# Patient Record
Sex: Male | Born: 1955
Health system: Southern US, Community
[De-identification: ages and names within clinical notes are randomized; demographics above are authoritative.]

## PROBLEM LIST (undated history)

## (undated) DIAGNOSIS — C6290 Malignant neoplasm of unspecified testis, unspecified whether descended or undescended: Secondary | ICD-10-CM

## (undated) DIAGNOSIS — K5790 Diverticulosis of intestine, part unspecified, without perforation or abscess without bleeding: Secondary | ICD-10-CM

## (undated) DIAGNOSIS — K219 Gastro-esophageal reflux disease without esophagitis: Secondary | ICD-10-CM

## (undated) DIAGNOSIS — K648 Other hemorrhoids: Secondary | ICD-10-CM

## (undated) DIAGNOSIS — L0390 Cellulitis, unspecified: Secondary | ICD-10-CM

## (undated) DIAGNOSIS — T7840XA Allergy, unspecified, initial encounter: Secondary | ICD-10-CM

## (undated) DIAGNOSIS — I809 Phlebitis and thrombophlebitis of unspecified site: Secondary | ICD-10-CM

## (undated) DIAGNOSIS — R609 Edema, unspecified: Secondary | ICD-10-CM

## (undated) DIAGNOSIS — K449 Diaphragmatic hernia without obstruction or gangrene: Secondary | ICD-10-CM

## (undated) DIAGNOSIS — L309 Dermatitis, unspecified: Secondary | ICD-10-CM

## (undated) DIAGNOSIS — D126 Benign neoplasm of colon, unspecified: Secondary | ICD-10-CM

## (undated) DIAGNOSIS — L03119 Cellulitis of unspecified part of limb: Secondary | ICD-10-CM

## (undated) HISTORY — DX: Cellulitis of unspecified part of limb: L03.119

## (undated) HISTORY — PX: COLONOSCOPY W/ POLYPECTOMY: SHX1380

## (undated) HISTORY — DX: Diaphragmatic hernia without obstruction or gangrene: K44.9

## (undated) HISTORY — DX: Other hemorrhoids: K64.8

## (undated) HISTORY — DX: Cellulitis, unspecified: L03.90

## (undated) HISTORY — DX: Benign neoplasm of colon, unspecified: D12.6

## (undated) HISTORY — DX: Phlebitis and thrombophlebitis of unspecified site: I80.9

## (undated) HISTORY — PX: OTHER SURGICAL HISTORY: SHX169

## (undated) HISTORY — DX: Gastro-esophageal reflux disease without esophagitis: K21.9

## (undated) HISTORY — PX: KNEE ARTHROPLASTY: SHX992

## (undated) HISTORY — DX: Diverticulosis of intestine, part unspecified, without perforation or abscess without bleeding: K57.90

## (undated) HISTORY — DX: Dermatitis, unspecified: L30.9

## (undated) HISTORY — PX: FRACTURE SURGERY: SHX138

## (undated) HISTORY — DX: Allergy, unspecified, initial encounter: T78.40XA

---

## 1979-03-24 HISTORY — PX: KNEE ARTHROSCOPY: SHX127

## 1999-03-24 DIAGNOSIS — L0390 Cellulitis, unspecified: Secondary | ICD-10-CM

## 1999-03-24 HISTORY — DX: Cellulitis, unspecified: L03.90

## 1999-10-10 ENCOUNTER — Inpatient Hospital Stay (HOSPITAL_COMMUNITY): Admission: EM | Admit: 1999-10-10 | Discharge: 1999-10-18 | Payer: Self-pay | Admitting: Internal Medicine

## 1999-10-10 ENCOUNTER — Encounter: Payer: Self-pay | Admitting: Internal Medicine

## 1999-10-12 ENCOUNTER — Encounter: Payer: Self-pay | Admitting: Internal Medicine

## 1999-10-17 ENCOUNTER — Encounter: Payer: Self-pay | Admitting: Internal Medicine

## 2002-03-23 DIAGNOSIS — C6290 Malignant neoplasm of unspecified testis, unspecified whether descended or undescended: Secondary | ICD-10-CM

## 2002-03-23 HISTORY — PX: I & D EXTREMITY: SHX5045

## 2002-03-23 HISTORY — PX: REFRACTIVE SURGERY: SHX103

## 2002-03-23 HISTORY — DX: Malignant neoplasm of unspecified testis, unspecified whether descended or undescended: C62.90

## 2002-06-30 ENCOUNTER — Encounter: Payer: Self-pay | Admitting: Orthopedic Surgery

## 2002-06-30 ENCOUNTER — Inpatient Hospital Stay (HOSPITAL_COMMUNITY): Admission: EM | Admit: 2002-06-30 | Discharge: 2002-07-03 | Payer: Self-pay | Admitting: Emergency Medicine

## 2003-03-24 HISTORY — PX: TUMOR EXCISION: SHX421

## 2003-03-26 ENCOUNTER — Encounter: Admission: RE | Admit: 2003-03-26 | Discharge: 2003-03-26 | Payer: Self-pay | Admitting: Internal Medicine

## 2003-04-12 ENCOUNTER — Ambulatory Visit (HOSPITAL_BASED_OUTPATIENT_CLINIC_OR_DEPARTMENT_OTHER): Admission: RE | Admit: 2003-04-12 | Discharge: 2003-04-12 | Payer: Self-pay | Admitting: Urology

## 2003-04-12 ENCOUNTER — Encounter (INDEPENDENT_AMBULATORY_CARE_PROVIDER_SITE_OTHER): Payer: Self-pay | Admitting: Specialist

## 2003-04-12 ENCOUNTER — Ambulatory Visit (HOSPITAL_COMMUNITY): Admission: RE | Admit: 2003-04-12 | Discharge: 2003-04-12 | Payer: Self-pay | Admitting: Urology

## 2003-04-26 ENCOUNTER — Ambulatory Visit (HOSPITAL_COMMUNITY): Admission: RE | Admit: 2003-04-26 | Discharge: 2003-04-26 | Payer: Self-pay | Admitting: Urology

## 2003-06-11 ENCOUNTER — Ambulatory Visit (HOSPITAL_COMMUNITY): Admission: RE | Admit: 2003-06-11 | Discharge: 2003-06-11 | Payer: Self-pay | Admitting: Hematology & Oncology

## 2003-08-13 ENCOUNTER — Ambulatory Visit (HOSPITAL_COMMUNITY): Admission: RE | Admit: 2003-08-13 | Discharge: 2003-08-13 | Payer: Self-pay | Admitting: Hematology & Oncology

## 2003-11-20 ENCOUNTER — Ambulatory Visit (HOSPITAL_COMMUNITY): Admission: RE | Admit: 2003-11-20 | Discharge: 2003-11-20 | Payer: Self-pay | Admitting: Hematology & Oncology

## 2004-02-23 ENCOUNTER — Ambulatory Visit: Payer: Self-pay | Admitting: Hematology & Oncology

## 2004-02-25 ENCOUNTER — Ambulatory Visit (HOSPITAL_COMMUNITY): Admission: RE | Admit: 2004-02-25 | Discharge: 2004-02-25 | Payer: Self-pay | Admitting: Hematology & Oncology

## 2004-04-24 ENCOUNTER — Ambulatory Visit: Payer: Self-pay | Admitting: Internal Medicine

## 2004-07-07 ENCOUNTER — Ambulatory Visit: Payer: Self-pay | Admitting: Hematology & Oncology

## 2004-07-07 ENCOUNTER — Ambulatory Visit: Admission: RE | Admit: 2004-07-07 | Discharge: 2004-07-07 | Payer: Self-pay | Admitting: Hematology & Oncology

## 2004-12-01 ENCOUNTER — Ambulatory Visit: Payer: Self-pay | Admitting: Hematology & Oncology

## 2004-12-17 ENCOUNTER — Ambulatory Visit (HOSPITAL_COMMUNITY): Admission: RE | Admit: 2004-12-17 | Discharge: 2004-12-17 | Payer: Self-pay | Admitting: Hematology & Oncology

## 2005-06-15 ENCOUNTER — Ambulatory Visit (HOSPITAL_COMMUNITY): Admission: RE | Admit: 2005-06-15 | Discharge: 2005-06-15 | Payer: Self-pay | Admitting: Hematology & Oncology

## 2005-07-06 ENCOUNTER — Ambulatory Visit: Payer: Self-pay | Admitting: Hematology & Oncology

## 2005-07-06 LAB — COMPREHENSIVE METABOLIC PANEL
ALT: 24 U/L (ref 0–40)
AST: 23 U/L (ref 0–37)
Albumin: 4.3 g/dL (ref 3.5–5.2)
BUN: 12 mg/dL (ref 6–23)
CO2: 27 mEq/L (ref 19–32)
Calcium: 9.1 mg/dL (ref 8.4–10.5)
Chloride: 102 mEq/L (ref 96–112)
Creatinine, Ser: 0.9 mg/dL (ref 0.4–1.5)
Potassium: 3.6 mEq/L (ref 3.5–5.3)

## 2005-07-06 LAB — LIPID PANEL
Cholesterol: 212 mg/dL — ABNORMAL HIGH (ref 0–200)
Triglycerides: 157 mg/dL — ABNORMAL HIGH (ref <150)

## 2005-07-06 LAB — CBC WITH DIFFERENTIAL/PLATELET
BASO%: 0.3 % (ref 0.0–2.0)
EOS%: 6 % (ref 0.0–7.0)
MCH: 29.9 pg (ref 28.0–33.4)
MCHC: 34.7 g/dL (ref 32.0–35.9)
MONO#: 0.4 10*3/uL (ref 0.1–0.9)
RBC: 5.31 10*6/uL (ref 4.20–5.71)
RDW: 13.1 % (ref 11.2–14.6)
WBC: 6.3 10*3/uL (ref 4.0–10.0)
lymph#: 0.8 10*3/uL — ABNORMAL LOW (ref 0.9–3.3)

## 2005-07-09 LAB — AFP TUMOR MARKER: AFP-Tumor Marker: 4.4 ng/mL (ref 0.0–8.0)

## 2005-11-02 ENCOUNTER — Ambulatory Visit: Payer: Self-pay | Admitting: Internal Medicine

## 2005-12-24 ENCOUNTER — Ambulatory Visit: Payer: Self-pay | Admitting: Internal Medicine

## 2006-01-27 ENCOUNTER — Ambulatory Visit: Payer: Self-pay | Admitting: Hematology & Oncology

## 2006-01-29 LAB — CBC WITH DIFFERENTIAL/PLATELET
Basophils Absolute: 0 10*3/uL (ref 0.0–0.1)
EOS%: 9.8 % — ABNORMAL HIGH (ref 0.0–7.0)
Eosinophils Absolute: 0.8 10*3/uL — ABNORMAL HIGH (ref 0.0–0.5)
HCT: 45.1 % (ref 38.7–49.9)
HGB: 15.4 g/dL (ref 13.0–17.1)
MCH: 30 pg (ref 28.0–33.4)
NEUT%: 68.3 % (ref 40.0–75.0)
lymph#: 1.1 10*3/uL (ref 0.9–3.3)

## 2006-02-02 LAB — COMPREHENSIVE METABOLIC PANEL
Albumin: 4.1 g/dL (ref 3.5–5.2)
CO2: 30 mEq/L (ref 19–32)
Calcium: 9.5 mg/dL (ref 8.4–10.5)
Chloride: 102 mEq/L (ref 96–112)
Glucose, Bld: 74 mg/dL (ref 70–99)
Potassium: 4.2 mEq/L (ref 3.5–5.3)
Sodium: 140 mEq/L (ref 135–145)
Total Bilirubin: 1 mg/dL (ref 0.3–1.2)
Total Protein: 7.1 g/dL (ref 6.0–8.3)

## 2006-02-02 LAB — LACTATE DEHYDROGENASE: LDH: 206 U/L (ref 94–250)

## 2006-03-25 ENCOUNTER — Ambulatory Visit: Payer: Self-pay | Admitting: Internal Medicine

## 2006-07-28 ENCOUNTER — Ambulatory Visit: Payer: Self-pay | Admitting: Hematology & Oncology

## 2006-07-30 LAB — CBC WITH DIFFERENTIAL/PLATELET
BASO%: 0.3 % (ref 0.0–2.0)
EOS%: 8.6 % — ABNORMAL HIGH (ref 0.0–7.0)
HCT: 44.3 % (ref 38.7–49.9)
MCH: 30.4 pg (ref 28.0–33.4)
MCHC: 35.4 g/dL (ref 32.0–35.9)
MONO#: 0.6 10*3/uL (ref 0.1–0.9)
NEUT%: 70.9 % (ref 40.0–75.0)
RDW: 12.9 % (ref 11.2–14.6)
WBC: 7.7 10*3/uL (ref 4.0–10.0)
lymph#: 0.9 10*3/uL (ref 0.9–3.3)

## 2006-08-03 LAB — COMPREHENSIVE METABOLIC PANEL
Albumin: 4.1 g/dL (ref 3.5–5.2)
BUN: 13 mg/dL (ref 6–23)
CO2: 30 mEq/L (ref 19–32)
Calcium: 9.1 mg/dL (ref 8.4–10.5)
Chloride: 101 mEq/L (ref 96–112)
Creatinine, Ser: 0.87 mg/dL (ref 0.40–1.50)
Glucose, Bld: 96 mg/dL (ref 70–99)

## 2006-08-03 LAB — LACTATE DEHYDROGENASE: LDH: 177 U/L (ref 94–250)

## 2006-08-03 LAB — AFP TUMOR MARKER: AFP-Tumor Marker: 3.5 ng/mL (ref 0.0–8.0)

## 2007-01-26 ENCOUNTER — Ambulatory Visit: Payer: Self-pay | Admitting: Hematology & Oncology

## 2007-01-28 ENCOUNTER — Encounter: Payer: Self-pay | Admitting: Internal Medicine

## 2007-01-28 LAB — CBC WITH DIFFERENTIAL/PLATELET
BASO%: 0.1 % (ref 0.0–2.0)
EOS%: 6.2 % (ref 0.0–7.0)
LYMPH%: 16.2 % (ref 14.0–48.0)
MCH: 30.5 pg (ref 28.0–33.4)
MCHC: 35.1 g/dL (ref 32.0–35.9)
MONO#: 0.6 10*3/uL (ref 0.1–0.9)
RBC: 5.06 10*6/uL (ref 4.20–5.71)
WBC: 6 10*3/uL (ref 4.0–10.0)
lymph#: 1 10*3/uL (ref 0.9–3.3)

## 2007-01-30 LAB — LACTATE DEHYDROGENASE: LDH: 171 U/L (ref 94–250)

## 2007-01-30 LAB — COMPREHENSIVE METABOLIC PANEL
ALT: 19 U/L (ref 0–53)
AST: 17 U/L (ref 0–37)
CO2: 26 mEq/L (ref 19–32)
Chloride: 105 mEq/L (ref 96–112)
Creatinine, Ser: 0.87 mg/dL (ref 0.40–1.50)
Sodium: 138 mEq/L (ref 135–145)
Total Bilirubin: 0.9 mg/dL (ref 0.3–1.2)
Total Protein: 7.4 g/dL (ref 6.0–8.3)

## 2007-01-30 LAB — BETA HCG QUANT (REF LAB): Beta hCG, Tumor Marker: <0.5 m[IU]/mL (ref <5.0)

## 2007-03-21 ENCOUNTER — Ambulatory Visit: Payer: Self-pay | Admitting: Internal Medicine

## 2007-03-21 DIAGNOSIS — Z87438 Personal history of other diseases of male genital organs: Secondary | ICD-10-CM | POA: Insufficient documentation

## 2007-03-21 DIAGNOSIS — E782 Mixed hyperlipidemia: Secondary | ICD-10-CM | POA: Insufficient documentation

## 2007-03-21 DIAGNOSIS — Z9889 Other specified postprocedural states: Secondary | ICD-10-CM

## 2007-03-21 DIAGNOSIS — L209 Atopic dermatitis, unspecified: Secondary | ICD-10-CM | POA: Insufficient documentation

## 2007-03-21 DIAGNOSIS — C6211 Malignant neoplasm of descended right testis: Secondary | ICD-10-CM | POA: Insufficient documentation

## 2007-03-25 ENCOUNTER — Encounter (INDEPENDENT_AMBULATORY_CARE_PROVIDER_SITE_OTHER): Payer: Self-pay | Admitting: *Deleted

## 2007-04-01 ENCOUNTER — Encounter (INDEPENDENT_AMBULATORY_CARE_PROVIDER_SITE_OTHER): Payer: Self-pay | Admitting: *Deleted

## 2007-04-01 ENCOUNTER — Encounter: Payer: Self-pay | Admitting: Internal Medicine

## 2007-04-04 ENCOUNTER — Encounter: Admission: RE | Admit: 2007-04-04 | Discharge: 2007-04-04 | Payer: Self-pay | Admitting: Orthopedic Surgery

## 2007-07-27 ENCOUNTER — Ambulatory Visit: Payer: Self-pay | Admitting: Hematology & Oncology

## 2007-08-01 LAB — CBC WITH DIFFERENTIAL/PLATELET
BASO%: 0.3 % (ref 0.0–2.0)
LYMPH%: 18.6 % (ref 14.0–48.0)
MCHC: 35 g/dL (ref 32.0–35.9)
MCV: 85.5 fL (ref 81.6–98.0)
MONO%: 9.7 % (ref 0.0–13.0)
Platelets: 250 10*3/uL (ref 145–400)
RBC: 5.04 10*6/uL (ref 4.20–5.71)

## 2007-08-03 LAB — BETA HCG QUANT (REF LAB): Beta hCG, Tumor Marker: <0.5 m[IU]/mL (ref <5.0)

## 2007-08-03 LAB — COMPREHENSIVE METABOLIC PANEL
ALT: 19 U/L (ref 0–53)
Alkaline Phosphatase: 52 U/L (ref 39–117)
Glucose, Bld: 78 mg/dL (ref 70–99)
Sodium: 142 mEq/L (ref 135–145)
Total Bilirubin: 0.7 mg/dL (ref 0.3–1.2)
Total Protein: 7.1 g/dL (ref 6.0–8.3)

## 2007-10-18 ENCOUNTER — Ambulatory Visit: Payer: Self-pay | Admitting: Internal Medicine

## 2007-11-21 ENCOUNTER — Ambulatory Visit: Payer: Self-pay | Admitting: Internal Medicine

## 2008-02-08 ENCOUNTER — Ambulatory Visit: Payer: Self-pay | Admitting: Hematology & Oncology

## 2008-02-09 ENCOUNTER — Encounter: Payer: Self-pay | Admitting: Internal Medicine

## 2008-02-09 LAB — CBC WITH DIFFERENTIAL (CANCER CENTER ONLY)
BASO%: 0.6 % (ref 0.0–2.0)
EOS%: 10.8 % — ABNORMAL HIGH (ref 0.0–7.0)
LYMPH#: 1 10*3/uL (ref 0.9–3.3)
MCHC: 33.9 g/dL (ref 32.0–35.9)
NEUT#: 3.1 10*3/uL (ref 1.5–6.5)
Platelets: 221 10*3/uL (ref 145–400)
RDW: 11.7 % (ref 10.5–14.6)
WBC: 5 10*3/uL (ref 4.0–10.0)

## 2008-02-12 LAB — COMPREHENSIVE METABOLIC PANEL
ALT: 37 U/L (ref 0–53)
CO2: 26 mEq/L (ref 19–32)
Calcium: 9.1 mg/dL (ref 8.4–10.5)
Chloride: 105 mEq/L (ref 96–112)
Creatinine, Ser: 0.82 mg/dL (ref 0.40–1.50)
Sodium: 141 mEq/L (ref 135–145)
Total Protein: 7.3 g/dL (ref 6.0–8.3)

## 2008-02-12 LAB — LACTATE DEHYDROGENASE: LDH: 202 U/L (ref 94–250)

## 2008-03-20 ENCOUNTER — Encounter (INDEPENDENT_AMBULATORY_CARE_PROVIDER_SITE_OTHER): Payer: Self-pay | Admitting: *Deleted

## 2008-03-20 ENCOUNTER — Ambulatory Visit: Payer: Self-pay | Admitting: Internal Medicine

## 2008-03-26 ENCOUNTER — Ambulatory Visit: Payer: Self-pay | Admitting: Gastroenterology

## 2008-03-27 ENCOUNTER — Encounter (INDEPENDENT_AMBULATORY_CARE_PROVIDER_SITE_OTHER): Payer: Self-pay | Admitting: *Deleted

## 2008-03-29 ENCOUNTER — Encounter (INDEPENDENT_AMBULATORY_CARE_PROVIDER_SITE_OTHER): Payer: Self-pay | Admitting: *Deleted

## 2008-04-11 ENCOUNTER — Encounter: Payer: Self-pay | Admitting: Internal Medicine

## 2008-04-23 ENCOUNTER — Ambulatory Visit: Payer: Self-pay | Admitting: Gastroenterology

## 2008-04-23 ENCOUNTER — Encounter: Payer: Self-pay | Admitting: Gastroenterology

## 2008-04-24 ENCOUNTER — Encounter: Payer: Self-pay | Admitting: Gastroenterology

## 2008-10-05 ENCOUNTER — Telehealth: Payer: Self-pay | Admitting: Internal Medicine

## 2009-02-06 ENCOUNTER — Ambulatory Visit: Payer: Self-pay | Admitting: Hematology & Oncology

## 2009-02-07 ENCOUNTER — Encounter: Payer: Self-pay | Admitting: Internal Medicine

## 2009-02-07 LAB — CBC WITH DIFFERENTIAL (CANCER CENTER ONLY)
BASO%: 0.8 % (ref 0.0–2.0)
EOS%: 11.5 % — ABNORMAL HIGH (ref 0.0–7.0)
HCT: 46.4 % (ref 38.7–49.9)
LYMPH%: 21.1 % (ref 14.0–48.0)
MCHC: 34.7 g/dL (ref 32.0–35.9)
MCV: 87 fL (ref 82–98)
NEUT%: 59.3 % (ref 40.0–80.0)
Platelets: 231 10*3/uL (ref 145–400)
RDW: 12.3 % (ref 10.5–14.6)

## 2009-02-09 LAB — BETA HCG QUANT (REF LAB): Beta hCG, Tumor Marker: <0.5 m[IU]/mL (ref <5.0)

## 2009-02-09 LAB — COMPREHENSIVE METABOLIC PANEL
ALT: 20 U/L (ref 0–53)
AST: 18 U/L (ref 0–37)
Albumin: 4.5 g/dL (ref 3.5–5.2)
Alkaline Phosphatase: 57 U/L (ref 39–117)
BUN: 10 mg/dL (ref 6–23)
Potassium: 4.9 mEq/L (ref 3.5–5.3)

## 2009-03-28 ENCOUNTER — Ambulatory Visit: Payer: Self-pay | Admitting: Internal Medicine

## 2009-03-28 DIAGNOSIS — Z86010 Personal history of colonic polyps: Secondary | ICD-10-CM | POA: Insufficient documentation

## 2009-03-28 DIAGNOSIS — J45909 Unspecified asthma, uncomplicated: Secondary | ICD-10-CM | POA: Insufficient documentation

## 2009-03-29 ENCOUNTER — Encounter (INDEPENDENT_AMBULATORY_CARE_PROVIDER_SITE_OTHER): Payer: Self-pay | Admitting: *Deleted

## 2009-04-19 ENCOUNTER — Encounter: Payer: Self-pay | Admitting: Internal Medicine

## 2009-07-07 ENCOUNTER — Emergency Department (HOSPITAL_COMMUNITY): Admission: EM | Admit: 2009-07-07 | Discharge: 2009-07-07 | Payer: Self-pay | Admitting: Family Medicine

## 2009-09-18 ENCOUNTER — Encounter: Payer: Self-pay | Admitting: Internal Medicine

## 2010-01-27 ENCOUNTER — Encounter: Payer: Self-pay | Admitting: Internal Medicine

## 2010-02-24 ENCOUNTER — Telehealth (INDEPENDENT_AMBULATORY_CARE_PROVIDER_SITE_OTHER): Payer: Self-pay | Admitting: *Deleted

## 2010-03-05 ENCOUNTER — Ambulatory Visit: Payer: Self-pay | Admitting: Internal Medicine

## 2010-03-05 DIAGNOSIS — R42 Dizziness and giddiness: Secondary | ICD-10-CM

## 2010-03-05 DIAGNOSIS — R51 Headache: Secondary | ICD-10-CM

## 2010-03-05 DIAGNOSIS — R519 Headache, unspecified: Secondary | ICD-10-CM | POA: Insufficient documentation

## 2010-03-07 LAB — CONVERTED CEMR LAB
ALT: 30 units/L (ref 0–53)
AST: 28 units/L (ref 0–37)
Albumin: 4 g/dL (ref 3.5–5.2)
Alkaline Phosphatase: 58 units/L (ref 39–117)
Basophils Relative: 0.6 % (ref 0.0–3.0)
Chloride: 102 meq/L (ref 96–112)
Eosinophils Relative: 10.8 % — ABNORMAL HIGH (ref 0.0–5.0)
GFR calc non Af Amer: 101.85 mL/min (ref >60.00)
HCT: 45.4 % (ref 39.0–52.0)
Hemoglobin: 15.3 g/dL (ref 13.0–17.0)
Lymphs Abs: 0.8 10*3/uL (ref 0.7–4.0)
Monocytes Relative: 10.6 % (ref 3.0–12.0)
Neutro Abs: 5.1 10*3/uL (ref 1.4–7.7)
Potassium: 5 meq/L (ref 3.5–5.1)
RBC: 5.02 M/uL (ref 4.22–5.81)
Sodium: 140 meq/L (ref 135–145)
TSH: 1.41 microintl units/mL (ref 0.35–5.50)
Total Protein: 7.3 g/dL (ref 6.0–8.3)
VLDL: 16.8 mg/dL (ref 0.0–40.0)
WBC: 7.6 10*3/uL (ref 4.5–10.5)

## 2010-04-13 ENCOUNTER — Encounter: Payer: Self-pay | Admitting: Hematology & Oncology

## 2010-04-20 LAB — CONVERTED CEMR LAB
ALT: 24 units/L (ref 0–53)
ALT: 34 units/L (ref 0–53)
AST: 20 units/L (ref 0–37)
AST: 26 units/L (ref 0–37)
Albumin: 3.7 g/dL (ref 3.5–5.2)
Albumin: 4 g/dL (ref 3.5–5.2)
Albumin: 4.1 g/dL (ref 3.5–5.2)
Alkaline Phosphatase: 46 units/L (ref 39–117)
Alkaline Phosphatase: 52 units/L (ref 39–117)
BUN: 11 mg/dL (ref 6–23)
BUN: 13 mg/dL (ref 6–23)
BUN: 17 mg/dL (ref 6–23)
Basophils Absolute: 0 10*3/uL (ref 0.0–0.1)
Basophils Absolute: 0.1 10*3/uL (ref 0.0–0.1)
Basophils Relative: 0 % (ref 0.0–1.0)
Bilirubin, Direct: 0.1 mg/dL (ref 0.0–0.3)
Bilirubin, Direct: 0.1 mg/dL (ref 0.0–0.3)
CO2: 27 meq/L (ref 19–32)
CO2: 31 meq/L (ref 19–32)
CO2: 31 meq/L (ref 19–32)
Calcium: 9 mg/dL (ref 8.4–10.5)
Calcium: 9.6 mg/dL (ref 8.4–10.5)
Calcium: 9.8 mg/dL (ref 8.4–10.5)
Chloride: 101 meq/L (ref 96–112)
Chloride: 98 meq/L (ref 96–112)
Cholesterol: 189 mg/dL (ref 0–200)
Cholesterol: 230 mg/dL — ABNORMAL HIGH (ref 0–200)
Creatinine, Ser: 0.8 mg/dL (ref 0.4–1.5)
Creatinine, Ser: 0.8 mg/dL (ref 0.4–1.5)
Eosinophils Absolute: 0.5 10*3/uL (ref 0.0–0.6)
Eosinophils Absolute: 0.5 10*3/uL (ref 0.0–0.7)
Eosinophils Relative: 6.5 % — ABNORMAL HIGH (ref 0.0–5.0)
GFR calc Af Amer: 132 mL/min
GFR calc Af Amer: 132 mL/min
GFR calc non Af Amer: 109 mL/min
GFR calc non Af Amer: 109 mL/min
GFR calc non Af Amer: 94.4 mL/min (ref >60)
Glucose, Bld: 102 mg/dL — ABNORMAL HIGH (ref 70–99)
Glucose, Bld: 92 mg/dL (ref 70–99)
Glucose, Bld: 94 mg/dL (ref 70–99)
HCT: 46.6 % (ref 39.0–52.0)
HDL: 60.3 mg/dL (ref >39.0)
HDL: 69.4 mg/dL (ref >39.00)
Hemoglobin: 15.8 g/dL (ref 13.0–17.0)
Hemoglobin: 16.6 g/dL (ref 13.0–17.0)
Hgb A1c MFr Bld: 5.4 % (ref 4.6–6.0)
LDL Cholesterol: 106 mg/dL — ABNORMAL HIGH (ref 0–99)
Lymphocytes Relative: 13.9 % (ref 12.0–46.0)
Lymphocytes Relative: 14.3 % (ref 12.0–46.0)
Lymphs Abs: 0.9 10*3/uL (ref 0.7–4.0)
MCHC: 31.8 g/dL (ref 30.0–36.0)
MCHC: 35.6 g/dL (ref 30.0–36.0)
MCV: 85.9 fL (ref 78.0–100.0)
Monocytes Absolute: 0.6 10*3/uL (ref 0.2–0.7)
Monocytes Relative: 8.5 % (ref 3.0–11.0)
Neutro Abs: 4.2 10*3/uL (ref 1.4–7.7)
Neutro Abs: 5.3 10*3/uL (ref 1.4–7.7)
Neutrophils Relative %: 70.7 % (ref 43.0–77.0)
PSA: 0.4 ng/mL (ref 0.10–4.00)
Platelets: 229 10*3/uL (ref 150.0–400.0)
Platelets: 317 10*3/uL (ref 150–400)
Potassium: 3.6 meq/L (ref 3.5–5.1)
Potassium: 4.1 meq/L (ref 3.5–5.1)
RBC: 5.42 M/uL (ref 4.22–5.81)
RDW: 12.2 % (ref 11.5–14.6)
RDW: 13.5 % (ref 11.5–14.6)
Sodium: 139 meq/L (ref 135–145)
Sodium: 139 meq/L (ref 135–145)
Sodium: 144 meq/L (ref 135–145)
TSH: 1.17 microintl units/mL (ref 0.35–5.50)
TSH: 1.89 microintl units/mL (ref 0.35–5.50)
TSH: 2.16 microintl units/mL (ref 0.35–5.50)
Total Bilirubin: 1.4 mg/dL — ABNORMAL HIGH (ref 0.3–1.2)
Total Bilirubin: 1.5 mg/dL — ABNORMAL HIGH (ref 0.3–1.2)
Total Bilirubin: 1.6 mg/dL — ABNORMAL HIGH (ref 0.3–1.2)
Total CHOL/HDL Ratio: 3.1
Total Protein: 7 g/dL (ref 6.0–8.3)
Total Protein: 8.1 g/dL (ref 6.0–8.3)
Triglycerides: 115 mg/dL (ref 0–149)
Triglycerides: 92 mg/dL (ref 0.0–149.0)
VLDL: 23 mg/dL (ref 0–40)
WBC: 7.5 10*3/uL (ref 4.5–10.5)

## 2010-04-22 ENCOUNTER — Ambulatory Visit: Admit: 2010-04-22 | Payer: Self-pay | Admitting: Internal Medicine

## 2010-04-22 NOTE — Assessment & Plan Note (Signed)
Summary: cpx/ns/kdc   Vital Signs:  Patient profile:   55 year old male Height:      65.25 inches Weight:      168.4 pounds BMI:     27.91 Temp:     97.4 degrees F oral Pulse rate:   72 / minute Resp:     14 per minute BP sitting:   120 / 84  (left arm) Cuff size:   large  Vitals Entered By: Shonna Chock (March 28, 2009 8:46 AM)  Comments REVIEWED MED LIST, PATIENT AGREED DOSE AND INSTRUCTION CORRECT    History of Present Illness: Mr.  Hedberg is here for a physical; his asthma has flared , but responded to Singulair & fexofenadine.Decreased SOB & wheezing,but residual dry cough.  Allergies (verified): No Known Drug Allergies  Past History:  Past Medical History: Eczema, Dr Terri Piedra ; cellulitis X 3 (elbow X2 & LLE); phlebitis LLE; Seminoma R testicle 2005, Dr Lafe Garin (no radiation or chemotherapy) Asthma, Dr [place] Callas; Colonic polyps, hx of, 04/2008, due 2013, Dr Jarold Motto Hyperlipidemia: NMR Liporofile : LDL 160(2069/1228), TG 136, HDL 51; LDL goal = < 100  Past Surgical History: Seminoma resected 2005, Dr Jonny Ruiz  Wrenn,Surgeon;Dr. Ennever,Oncologist, released 01/2009. Lasik bilat 2004; knee arthroscopy X2 ,Dr Thurston Hole;  Colon polypectomy  Family History: Father: liver cancer, dysrrhymthmia Mother: mini CVA Siblings: sister asthma; PGF stomach cancer;  M uncle eczema  Social History: No diet Occupation: Psychologist, sport and exercise Married Never Smoked Alcohol use-yes: socially Regular exercise-no: walking occasionally  Review of Systems General:  Denies chills, fatigue, fever, sweats, and weight loss. Eyes:  Denies blurring, double vision, and vision loss-both eyes; Decreased night vision. ENT:  Complains of nasal congestion; denies difficulty swallowing and hoarseness. CV:  Complains of swelling of feet; denies bluish discoloration of lips or nails, chest pain or discomfort, difficulty breathing at night, difficulty breathing while lying down, leg cramps with exertion,  palpitations, shortness of breath with exertion, and swelling of hands; Lymphedema, PMH of. Resp:  Complains of cough; denies chest pain with inspiration, shortness of breath, and sputum productive; Sporadic wheezing. GI:  Denies abdominal pain, bloody stools, dark tarry stools, and indigestion. GU:  Complains of erectile dysfunction; denies decreased libido, discharge, and hematuria. MS:  Complains of joint pain; denies joint redness and joint swelling; Pain in R heel. Derm:  Complains of changes in color of skin and rash. Neuro:  Denies headaches, numbness, tingling, and weakness. Psych:  Denies anxiety and depression. Endo:  Denies cold intolerance, excessive hunger, excessive thirst, excessive urination, and heat intolerance. Heme:  Denies abnormal bruising and bleeding. Allergy:  Complains of itching eyes, seasonal allergies, and sneezing; Dr [place]ss[place] Callas seen.  Physical Exam  General:  well-nourished,in no acute distress; alert,appropriate and cooperative throughout examination Head:  Normocephalic and atraumatic without obvious abnormalities. No apparent alopecia . Eyes:  No corneal or conjunctival inflammation noted. Perrla. Funduscopic exam benign, without hemorrhages, exudates or papilledema.  Ears:  Some increased wax bilaterally Nose:  External nasal examination shows no deformity or inflammation. Nasal mucosa aredry  without lesions or exudates. Mouth:  Oral mucosa and oropharynx without lesions or exudates.  Teeth in good repair. Neck:  No deformities, masses, or tenderness noted. Mild asymmetry of thyroid w/o nodules Lungs:  Normal respiratory effort, chest expands symmetrically. Lungs are clear to auscultation, no crackles or wheezes. Heart:  normal rate, regular rhythm, no gallop, no rub, no JVD, no HJR, and grade 1 /6 systolic murmur.   Abdomen:  Bowel sounds positive,abdomen  soft and non-tender without masses, organomegaly or hernias noted. Rectal:  External  hemorrhoidal tags   noted. Normal sphincter tone. No rectal masses or tenderness. Genitalia:  R testis absent; L WNL Prostate:  Prostate gland firm and smooth, ULN  enlargement, w/o nodularity, tenderness, mass, asymmetry or induration. Msk:  No deformity or scoliosis noted of thoracic or lumbar spine.   Pulses:  R and L carotid,radial, and posterior tibial pulses are full and equal bilaterally. Slightly decreased DPP w/o ischemic changes. Pes planus ; heel slightly tender to percussion. Extremities:  No clubbing, cyanosis.Minimal lymphedema LLE. Mild arthritic changes of hands; nails flattened. Neurologic:  alert & oriented X3 and DTRs symmetrical and normal.   Skin:  Diffuse exfoliative dermatitis & drying Cervical Nodes:  No lymphadenopathy noted Axillary Nodes:  No palpable lymphadenopathy Psych:  memory intact for recent and remote, normally interactive, and good eye contact.     Impression & Recommendations:  Problem # 1:  PREVENTIVE HEALTH CARE (ICD-V70.0)  Orders: Venipuncture (30865) TLB-Lipid Panel (80061-LIPID) TLB-BMP (Basic Metabolic Panel-BMET) (80048-METABOL) TLB-CBC Platelet - w/Differential (85025-CBCD) TLB-Hepatic/Liver Function Pnl (80076-HEPATIC) TLB-TSH (Thyroid Stimulating Hormone) (84443-TSH) EKG w/ Interpretation (93000)  Problem # 2:  HYPERLIPIDEMIA (ICD-272.2)  Off statin 6 months The following medications were removed from the medication list:    Pravachol 40 Mg Tabs (Pravastatin sodium) .Marland Kitchen... 1 at bedtime  Orders: Venipuncture (78469) TLB-Lipid Panel (80061-LIPID) EKG w/ Interpretation (93000)  Problem # 3:  SEMINOMA (ICD-186.9)  PMH of  Orders: Venipuncture (62952)  Problem # 4:  ASTHMA (ICD-493.90)  Mild , intermittent His updated medication list for this problem includes:    Singulair 10 Mg Tabs (Montelukast sodium) .Marland Kitchen... 1 by mouth once daily  Orders: Venipuncture (84132)  Problem # 5:  ECZEMA, ATOPIC (ICD-691.8)  His updated medication list for this  problem includes:    Desonide 0.05 % Oint (Desonide) .Marland Kitchen... Apply to face 1 x daily    Betamethasone Dipropionate 0.05 % Oint (Betamethasone dipropionate) .Marland Kitchen... Apply to body 1 x daily    Clobetasol Propionate 0.05 % Oint (Clobetasol propionate) .Marland Kitchen... Apply as needed  Orders: Venipuncture (44010)  Complete Medication List: 1)  Viagra 100 Mg Tabs (Sildenafil citrate) .... Take as directed 2)  Singulair 10 Mg Tabs (Montelukast sodium) .Marland Kitchen.. 1 by mouth once daily 3)  Allegra 180 Mg Tabs (Fexofenadine hcl) .Marland Kitchen.. 1 by mouth once daily 4)  Desonide 0.05 % Oint (Desonide) .... Apply to face 1 x daily 5)  Betamethasone Dipropionate 0.05 % Oint (Betamethasone dipropionate) .... Apply to body 1 x daily 6)  Clobetasol Propionate 0.05 % Oint (Clobetasol propionate) .... Apply as needed  Patient Instructions: 1)  Symbicort 1-2  puffs two times a day as needed for cough or wheezing. Your LDL goal = < 100(Note: off stain 6 months) . Arch supports & support shoes as discussed

## 2010-04-22 NOTE — Letter (Signed)
Summary: [id number]  [facility]   Imported By: Lanelle Bal 02/21/2010 09:57:28  _____________________________________________________________________  External Attachment:    Type:   Image     Comment:   External Document

## 2010-04-22 NOTE — Progress Notes (Signed)
Summary: Refill Request  Phone Note Refill Request Message from:  Fax from Pharmacy on February 24, 2010 5:00 PM  Refills Requested: Medication #1:  VIAGRA 100 MG  TABS take as directed Gweneth Dimitri - fax [phone]  Initial call taken by: Okey Regal [place],  February 24, 2010 5:01 PM    Prescriptions: VIAGRA 100 MG  TABS (SILDENAFIL CITRATE) take as directed  #6 Tablet x 0   Entered by:   Shonna Chock CMA   Authorized by:   Marga Melnick MD   Signed by:   Shonna Chock CMA on 02/25/2010   Method used:   Electronically to        [id number] (retail)       [address]       [place], [place]  [address]       Ph: [phone]       Fax: [phone]   RxID:   [id number]

## 2010-04-22 NOTE — Letter (Signed)
Summary: Results Follow up Letter  [facility]  [address] [place], [place] [address]   Phone: [phone]  Fax: [phone]    03/29/2009 MRN: [id number]     Gregory Phillips [address] [place], [place]  [address]  Dear Mr. BRANTLEYJR,  The following are the results of your recent test(s):  Test         Result    Pap Smear:        Normal _____  Not Normal _____ Comments: ______________________________________________________ Cholesterol: LDL(Bad cholesterol):         Your goal is less than:         HDL (Good cholesterol):       Your goal is more than: Comments:  ______________________________________________________ Mammogram:        Normal _____  Not Normal _____ Comments:  ___________________________________________________________________ Hemoccult:        Normal _____  Not normal _______ Comments:    _____________________________________________________________________ Other Tests:Please see attached labs.     We routinely do not discuss normal results over the telephone.  If you desire a copy of the results, or you have any questions about this information we can discuss them at your next office visit.   Sincerely,    Felecia,CMA

## 2010-04-22 NOTE — Letter (Signed)
Summary: [facility]  [facility]   Imported By: Lanelle Bal 05/09/2009 08:37:45  _____________________________________________________________________  External Attachment:    Type:   Image     Comment:   External Document

## 2010-04-22 NOTE — Letter (Signed)
Summary: [facility]  [facility]   Imported By: Lennie Odor 10/21/2009 10:18:09  _____________________________________________________________________  External Attachment:    Type:   Image     Comment:   External Document

## 2010-04-24 NOTE — Assessment & Plan Note (Signed)
Summary: VERTIGO/RH......Marland Kitchen   Vital Signs:  Patient profile:   55 year old male Weight:      169.8 pounds BMI:     28.14 O2 Sat:      98 % on Room air Temp:     97.8 degrees F oral Pulse rate:   72 / minute Pulse (ortho):   63 / minute Resp:     14 per minute BP sitting:   110 / 76  (left arm) BP standing:   118 / 86 Cuff size:   large  Vitals Entered By: Shonna Chock CMA (March 05, 2010 10:40 AM)  O2 Flow:  Room air  Serial Vital Signs/Assessments:  Time      Position  BP       Pulse  Resp  Temp     By 11:22 AM  Lying LA  110/74   61                    Chrae Malloy CMA 11:22 AM  Sitting   120/80   56                    Chrae Malloy CMA 11:22 AM  Standing  118/86   63                    Chrae Malloy CMA  CC: Patient with a very anxious feeling, patient feels like everything around him is moving faster than him, Syncope, Headaches   CC:  Patient with a very anxious feeling, patient feels like everything around him is moving faster than him, Syncope, and Headaches.  History of Present Illness:      This is a 55 year old man who presents with  "dizziness" intermittently X 1 month.  The patient reports lightheadedness, but denies loss of consciousness, premonitory symptoms, near loss of consciousness, palpitations, chest pain,  and incontinence.  Associated symptoms include headache, abdominal discomfort, feeling warm, and blurred vision.  The patient denies the following symptoms: nausea, vomiting, pallor, diaphoresis, focal weakness, and perioral numbness.  The patient reports the following precipitating factors: standing upright  occasionally &  driving , especially over bridge. He has sensation of" road coming @ me faster, like a roller coaster@ crest".No BPV symptoms. The patient denies tearing of eyes, nasal congestion, sinus pressure, photophobia, and phonophobia.  The headache is described as intermittent and dull.  The location of the pain is bitemporal.  The patient denies  the following high-risk features: fever, neck pain/stiffness, focal weakness, and altered mental status.  Prior treatment has included a NSAID with response, 6 pills  in past month , 2 @ a time.  He stopped statin 12 months ago.  Current Medications (verified): 1)  Viagra 100 Mg  Tabs (Sildenafil Citrate) .... Take As Directed 2)  Singulair 10 Mg Tabs (Montelukast Sodium) .Marland Kitchen.. 1 By Mouth Once Daily 3)  Allegra 180 Mg Tabs (Fexofenadine Hcl) .Marland Kitchen.. 1 By Mouth Once Daily 4)  Desonide 0.05 % Oint (Desonide) .... Apply To Face 1 X Daily 5)  Betamethasone Dipropionate 0.05 % Oint (Betamethasone Dipropionate) .... Apply To Body 1 X Daily 6)  Clobetasol Propionate 0.05 % Oint (Clobetasol Propionate) .... Apply As Needed 7)  Nasonex 50 Mcg/act Susp (Mometasone Furoate) .... Once  At Bedtime  Allergies (verified): No Known Drug Allergies  Review of Systems General:  Complains of chills and sweats. GI:  Denies bloody stools, dark tarry stools, and indigestion; Early satiety. Neuro:  Denies brief paralysis, disturbances in coordination, falling down, numbness, sensation of room spinning, and tingling; Intermittent "shakes". Psych:  Denies anxiety and depression.  Physical Exam  General:  well-nourished,in no acute distress; alert,appropriate and cooperative throughout examination Eyes:  No corneal or conjunctival inflammation noted. EOMI. Perrla. Field of  Vision grossly normal. Ears:  External ear exam shows no significant lesions or deformities.  Otoscopic examination reveals clear canals, tympanic membranes are intact bilaterally without bulging, retraction, inflammation or discharge. Hearing is grossly normal bilaterally. Mouth:  Oral mucosa and oropharynx without lesions or exudates.  Teeth in good repair. Lungs:  Normal respiratory effort, chest expands symmetrically. Lungs are clear to auscultation, no crackles or wheezes. Heart:  Normal rate and regular rhythm. S1 and S2 normal without gallop,  murmur, click, rub.S4 Pulses:  R and L carotid,radial,dorsalis pedis and posterior tibial pulses are full and equal bilaterally Extremities:  Lymphedema LLE , support hose worn Neurologic:  alert & oriented X3, cranial nerves II-XII intact, strength normal in all extremities, sensation intact to light touch, gait( heel /toe)  normal, DTRs symmetrical and normal, finger-to-nose normal, and Romberg negative.   Skin:  Facial drying Cervical Nodes:  No lymphadenopathy noted Axillary Nodes:  No palpable lymphadenopathy Psych:  memory intact for recent and remote, normally interactive, good eye contact, not anxious appearing, and not depressed appearing.     Impression & Recommendations:  Problem # 1:  DIZZINESS (ICD-780.4)  His updated medication list for this problem includes:    Allegra 180 Mg Tabs (Fexofenadine hcl) .Marland Kitchen... 1 by mouth once daily  Orders: Venipuncture (16109) TLB-BMP (Basic Metabolic Panel-BMET) (80048-METABOL) TLB-CBC Platelet - w/Differential (85025-CBCD) TLB-TSH (Thyroid Stimulating Hormone) (84443-TSH) Specimen Handling (60454)  Problem # 2:  HEADACHE (ICD-784.0)  Orders: Venipuncture (09811)  Problem # 3:  HYPERLIPIDEMIA (ICD-272.2) off statin 12 months The following medications were removed from the medication list:    Simvastatin 20 Mg Tabs (Simvastatin) .Marland Kitchen... 1 at bedtime  Orders: TLB-Hepatic/Liver Function Pnl (80076-HEPATIC) TLB-Lipid Panel (80061-LIPID) Specimen Handling (91478)  Complete Medication List: 1)  Viagra 100 Mg Tabs (Sildenafil citrate) .... Take as directed 2)  Singulair 10 Mg Tabs (Montelukast sodium) .Marland Kitchen.. 1 by mouth once daily 3)  Allegra 180 Mg Tabs (Fexofenadine hcl) .Marland Kitchen.. 1 by mouth once daily 4)  Desonide 0.05 % Oint (Desonide) .... Apply to face 1 x daily 5)  Betamethasone Dipropionate 0.05 % Oint (Betamethasone dipropionate) .... Apply to body 1 x daily 6)  Clobetasol Propionate 0.05 % Oint (Clobetasol propionate) .... Apply as  needed 7)  Nasonex 50 Mcg/act Susp (Mometasone furoate) .... Once  at bedtime  Patient Instructions: 1)  Keep Headache  Diary as discussed.   Orders Added: 1)  Est. Patient Level IV [29562] 2)  Venipuncture [36415] 3)  TLB-BMP (Basic Metabolic Panel-BMET) [80048-METABOL] 4)  TLB-CBC Platelet - w/Differential [85025-CBCD] 5)  TLB-TSH (Thyroid Stimulating Hormone) [84443-TSH] 6)  TLB-Hepatic/Liver Function Pnl [80076-HEPATIC] 7)  TLB-Lipid Panel [80061-LIPID] 8)  Specimen Handling [99000]

## 2010-04-29 ENCOUNTER — Encounter: Payer: Self-pay | Admitting: Internal Medicine

## 2010-05-14 ENCOUNTER — Encounter: Payer: Self-pay | Admitting: Internal Medicine

## 2010-05-29 NOTE — Letter (Signed)
Summary: [facility]  [facility]   Imported By: Lanelle Bal 05/21/2010 09:29:35  _____________________________________________________________________  External Attachment:    Type:   Image     Comment:   External Document

## 2010-06-07 ENCOUNTER — Ambulatory Visit (INDEPENDENT_AMBULATORY_CARE_PROVIDER_SITE_OTHER): Payer: 59

## 2010-06-07 ENCOUNTER — Inpatient Hospital Stay (INDEPENDENT_AMBULATORY_CARE_PROVIDER_SITE_OTHER)
Admission: RE | Admit: 2010-06-07 | Discharge: 2010-06-07 | Disposition: A | Discharge status: Home / Self Care | Payer: 59 | Source: Ambulatory Visit | Attending: Emergency Medicine | Admitting: Emergency Medicine

## 2010-06-07 DIAGNOSIS — S2249XA Multiple fractures of ribs, unspecified side, initial encounter for closed fracture: Secondary | ICD-10-CM

## 2010-08-08 NOTE — Op Note (Signed)
NAME:  LOYS, Gregory Phillips                       ACCOUNT NO.:  [id number]   MEDICAL RECORD NO.:  [id number]                   PATIENT TYPE:  AMB   LOCATION:  [facility]                                 FACILITY:  [facility]   PHYSICIAN:  Excell Seltzer. Annabell Howells, M.D.                 DATE OF BIRTH:  02/14/1956   DATE OF PROCEDURE:  04/12/2003  DATE OF DISCHARGE:                                 OPERATIVE REPORT   PROCEDURES:  1. Right radical orchiectomy.  2. Right inguinal hernia repair.   PREOPERATIVE DIAGNOSIS:  Right testicular mass, probable seminoma.   POSTOPERATIVE DIAGNOSES:  Right testicular mass, probable seminoma with  small right inguinal hernia.   SURGEON:  Excell Seltzer. Annabell Howells, M.D.   ANESTHESIA:  General.   COMPLICATIONS:  None.   INDICATIONS:  Mr. Gregory Phillips is a 55 year old, white male, who was  seen in consultation for a right testicular mass with an ultrasound finding  suspicious for neoplasm.  Tumor markers were obtained.  Alpha-fetoprotein  and beta hCG were unremarkable, but an LDH was elevated to over 1000,  raising suspicion for seminoma.  Chest x-ray was unremarkable.  It was felt  that radical orchiectomy was indicated as initial treatment.  The patient  has elected not to have placement of a testicular prosthesis.   FINDINGS AND PROCEDURE:  The patient was taken to the operating room where  general anesthetic was induced.  He was in the supine position.  His right  inguinal area was shaved.  He was prepped with Betadine solution, and he was  draped in the usual sterile fashion.  An oblique incision was made along the  skin lines in the right inguinal region.  The subcutaneous fat was opened  with the Bovie.  Superficial vein was identified, was initially cauterized  and then ligated with 3-0 chromic ties.  The external oblique fascia was  identified and incised along its fibers over the spermatic cord.  Once the  external ring was open, investigation of the ilioinguinal  nerve was  performed.  This structure was not identified, but it had not felt to have  been encountered during the initial incision.  The spermatic cord was then  dissected out and then doubly encircled with a quarter-inch Penrose drain to  provide vascular occlusion.  The right testicle was then delivered from the  scrotum, and gubernacular attachments were taken down with the Bovie, and  some residual cremasteric attachments were taken down.  Once the testicle  was free from the scrotum, further dissection was carried up to the internal  ring, and the spermatic cord was then divided into 3 packets, clamped with  hemostats and divided.  The pedicles were then doubly ligated with 0 Vicryl  ties to which were left long to aid future identification if necessary.  After removal of the testicle, the floor of the inguinal canal was inspected  for hemostasis.  No bleeding  was noted, but he was noted to have a small  direct inguinal hernia that was felt to require repair.  Repair of the  inguinal hernia was performed using 2-0 Prolene interrupted sutures.  The 2  medial sutures were placed through Cooper'Phillips ligament inferiorly in the  conjoin tendon superiorly.  The more lateral sutures were placed in the  shelving edge to conjoin tendon.  There did not appear to be a need for a  relaxing incision or mesh due to the small size of the hernia.  Once again,  care was taken to avoid any nerve entrapment during this portion of the  procedure.  No nerves were identified.  Once the Prolenes were tied and  trimmed, the external oblique fascia was closed using a running 3-0 Vicryl  suture.  The wound was irrigated, and the fascial plane was infiltrated with  4 mL of 0.25% Marcaine.  The skin edges were infiltrated with 6 mL of 0.25%  Marcaine.  The subcutaneous tissues were reapproximated using interrupted 3-  0 chromics.  The skin was closed with an intracuticular 4-0 Vicryl stitch.  The wound was then  cleansed and reinforced with Steri-Strips.  A dressing  was applied.  The patient'Phillips anesthetic was reversed.  He was moved to the  recovery room in stable condition.  The testicle was sent fresh to pathology  with a rush request.  There were no complications.                                               Excell Seltzer. Annabell Howells, M.D.    JJW/MEDQ  D:  04/12/2003  T:  04/12/2003  Job:  [id number]   cc:   Titus Dubin. Alwyn Ren, M.D. [facility]   Sherrine Maples T. Fredia Sorrow, M.D.  [address], [address]  [place]  [place] [address]  Fax: [phone]

## 2010-08-08 NOTE — Discharge Summary (Signed)
NAME:  BRYLER, DIBBLE NO.:  [id number]   MEDICAL RECORD NO.:  [id number]                   PATIENT TYPE:  INP   LOCATION:  5015                                 FACILITY:  [facility]   PHYSICIAN:  Dyke Brackett, M.D.                 DATE OF BIRTH:  Jun 15, 1955   DATE OF ADMISSION:  06/30/2002  DATE OF DISCHARGE:  07/03/2002                                 DISCHARGE SUMMARY   ADMISSION DIAGNOSES:  1. Cellulitis -  possible abscess, right elbow.  2. Probable septic bursitis, right elbow.  3. Atopic dermatitis.   DISCHARGE DIAGNOSIS:  1. Cellulitis - possible abscess, right elbow.  2. Probable septic bursitis, right elbow.  3. Atopic dermatitis.   SURGICAL PROCEDURE:  None.   CONSULTS:  Infectious disease consult by Dr. Roxan Hockey on June 30, 2002.   HISTORY OF PRESENT ILLNESS:  This 55 year old white male patient presented  to Dr. Madelon Lips prior to admission in her office with complaints of right  elbow pain and swelling.  He subsequently had a septic olecranon bursa  drained in the office.  He was sent home on antibiotics.  He called the day  of admission with a 3-4 day history of continued pain and drainage.  He was  admitted for antibiotics and rule out of an abscess.   HOSPITAL COURSE:  The patient was admitted and taken to 5000.  Infectious  disease consult was obtained.  They followed him throughout his  hospitalization.  An MRI was done and that showed no drainable abscess.  His  temperature on antibiotics did decrease and his white count also decreased  significantly.  He remained afebrile and his pain became well controlled.  Infectious disease thought on the 12th that he was ready for discharge home  on amoxicillin and he was able to be discharged home at that time.   DISCHARGE INSTRUCTIONS:  1. Diet:  He can resume his regular pre-hospitalization diet.  2. Medications:  He may resume his pre-hospitalization medications including     the  Augmentin he was given preoperatively for this cellulitis, septic     bursitis.  3. Activity:  He can be out of bed as tolerated.  Keep the right elbow     dressing clean and dry.  4. Wound care:  He needs to keep the right elbow incision clean and dry.     Notify Dr. Madelon Lips of temp greater than or equal to 101.5, chills, pain     not relieved by pain medication or increased redness or drainage from the     wound.   FOLLOW UP:  1. He needs to followup with Dr. Jorja Loa in regards to his atopic dermatitis     next week.  2. He needs an appointment with Dr. Madelon Lips later in the week for wound     check.  He needs to call [phone] for that appointment.  LABORATORY DATA:  On April 9, his neutrophils were 77%, absolute was 8.1  million per microliter, lymphs were 10%.  On April total bili was 1.5.  On April 10, albumin was 3.2.  All other laboratory studies were within  normal limits.  All his cultures showed no growth after five days.      Legrand Pitts Marshall Cork, M.D.    KED/MEDQ  D:  08/01/2002  T:  08/01/2002  Job:  [id number]   cc:   Ria Bush. Jorja Loa, M.D.  [address]  [place]  [place] [address]  Fax: [phone]

## 2010-08-08 NOTE — H&P (Signed)
[facility]  Patient:    Gregory Phillips, Gregory Phillips                      MRN: [id number] Adm. Date:  [id number] Attending:  Corwin Levins                         History and Physical  CHIEF COMPLAINT:  Increasing redness, swelling and pain into the left lower extremity.  HISTORY OF PRESENT ILLNESS:  The patient is a 55 year old white male with chronic left lower extremity lymphedema for the past 15 years, now here with 24 hours of acutely worsening, rapidly increasing redness, swelling and pain in the left lower extremity starting from the whole foot and now close to the knee.  There are fevers, chills, nausea, vomiting x 1 and minor loose stools associated.  He saw and Urgent Care M.D. yesterday morning for significant erythema and only left foot swelling per patient.  He took one dose of Tequin p.o. 400 mg and thinks he may have kept it down, but is not sure because of nausea and vomiting.  Temperature up to 102 this morning.  There have been some shaking chills, as well.  PAST MEDICAL HISTORY:  Chronic left lower extremity lymphedema, usual wears compression stockings.  PAST SURGICAL HISTORY:  Status post knee arthroscopic surgery on the right in 1981.  ALLERGIES:  No known drug allergies.  MEDICATIONS:  Tequin p.o.  ______.  SOCIAL HISTORY:  He is married with two children.  He is a Medical illustrator.  No tobacco.  Occasional alcohol.  FAMILY HISTORY:  Negative for cardiovascular disease, diabetes, hypertension, cancer, alcoholism or depression.  REVIEW OF SYSTEM:  Otherwise noncontributory.  PHYSICAL EXAMINATION:  GENERAL:  A 55 year old white male looking mildly to moderately ill.  VITAL SIGNS:  Blood pressure 130/80, pulse 88, temperature 97.6.  HEENT:  Sclerae clear.  Tympanic membranes clear.  Pharynx very dry with questionable thrush involved.  CHEST:  No rales or wheezing.  CARDIAC:  Regular rate and rhythm.  No murmur.  ABDOMEN:  Soft and  nontender.  Positive bowel sounds.  No organomegaly.  N o masses.  EXTREMITIES:  The left lower extremity to below the knee with 2+ to 3+ redness, swelling and tenderness, worse distally a the foot.  No crepitus. Dorsalis pedis pulse 2+.  Neurologically intact.  NEUROLOGIC:  Cranial nerves II-XII intact.  Otherwise nonfocal.  SKIN:  Without other significant lesions.  ASSESSMENT AND PLAN: 1. Left lower extremity cellulitis.  He is to be admitted.  Blood cultures x 2    and routine labs.  He is given IV Tequin as well as IV fluids.  Will check    left lower extremity routine films to rule out gas forming organisms and    osteomyelitis, especially doubt with short duration of symptoms and signs.    Will also check left lower extremity venous Doppler, though also    relatively low suspicion at this time, but simply cannot rule out. 2. Questionable oral thrush.  Will try Nystatin oral solution. DD:  10/10/99 TD:  10/13/99 Job: [id number]

## 2010-08-08 NOTE — Discharge Summary (Signed)
[facility]  Patient:    Gregory Phillips, Gregory Phillips                      MRN: [id number] Adm. Date:  [id number] Disc. Date: [id number] Attending:  Corwin Levins CC:         Corwin Levins, M.D. [facility]             Veneda Melter, M.D. [facility]             Dewayne Shorter, M.D.             Ria Bush Jorja Phillips, M.D. (dermatologist).                           Discharge Summary  ADMITTING DIAGNOSES: 1. Cellulitis of the left leg. 2. Chronic lymphedema. 3. Esophageal reflux.  DISCHARGE DIAGNOSES: 1. Cellulitis of the left leg. 2. Chronic lymphedema. 3. Esophageal reflux. 4. Calcified pulmonary granuloma indicative of remote healed granulomatous    disease with no evidence of active process. 5. Hyperglycemia. 6. Dyspnea with no evidence of pulmonary emboli.  BRIEF HISTORY:  Gregory Phillips is a 55 year old white male who has chronic left lower extremity lymphedema present for approximately 15 years, who is presenting with a 24-hour-plus history of progressive increase in erythema and edema and pain in the left lower extremity.  This is associated with fever, chills and nausea and vomiting and some bowel change.  He had been seen at urgent care on October 09, 1999 and placed on Tequin.  He is unsure whether he was able to hold that down after nausea and vomiting.  The morning of admission, his temperature was 102 and was associated with rigor.  HOSPITAL COURSE:  He was admitted with a diagnosis of cellulitis and placed on parenteral Tequin.  Venous Doppler was performed which showed low suspicion for deep venous thrombosis.  The final report was that there was no evidence of thrombosis.  At the time of admission, his temperature was 100.6 and his WBC was 26,700. With IV Tequin, there was resolution of fever and correction of the leukocytosis.  Because of slow resolution, he was switched to nafcillin IV, which, according to [facility], would be the drug of  choice.  His hospital course was complicated by some phlebitis related to the IV nafcillin.  He also received Bactroban cream topically to any macerated areas.  He continued to be afebrile but continued to have significant edema in the left lower extremity.  He was seen in consultation by Dr. Dewayne Shorter, infectious disease.  Because of the phlebitis, Dr. Orvan Falconer recommended switching him to oral Keflex and to be continued for an additional five days following discharge.  It was also recommended to use Ace wraps to control the edema.  He was seen in consultation by Dr. Veneda Melter, cardiologist who specializes in peripheral vascular disease.  Arterial Dopplers and ABIs were performed which were normal, indicating no significant arterial disease in the lower extremity.  It was Dr. Donne Phillips recommendation that he be followed long-term with compressive stockings; additionally, he recommended aspirin for anti-inflammatory and anticoagulant properties.  Dr. Ria Bush. Tafeen, his dermatologist, saw Gregory Phillips and recommended stopping the topical Bactroban.  He will see Gregory Phillips as an outpatient to consider appropriate topical medications including a new agent which has become available.  Gregory Phillips was concerned that her husband might have some scleral icterus. A fasting lipid  profile did reveal a total bilirubin of 1.4, suggesting Gilberts syndrome, which is benign elevation of the total bilirubin.  His alkaline phosphatase was minimally elevated at 122 (normal less than 117); SGPT was 93 with normals up to 40.  Albumin was low at 2.2 and protein supplementation was recommended.  Incidental finding on chest x-ray was a vague nodule in the superior segment of the right lower lobe.  CT scan showed that this was a densely calcified granuloma with some calcified hilar lymphadenopathy as well.  This was compatible with remote granulomatous disease with no evidence of any  active process.  He additionally had some loose stool.  C. difficile was collected but is pending at the time of discharge.  He did have hyperglycemia, with sugars as high as 128; hemoglobin A1c was normal at 4.9, indicating the absence of diabetes as a predisposition to the cellulitis.  Potassium was a low at 3.2, promptly supplementation; prior to discharge, it had risen to 3.4.  His fasting lipid profile was surprisingly good with a total cholesterol of 136 and triglycerides of 93 and LDL -- or bad cholesterol -- of 96.  His HDL, a good cholesterol, was 21, with normals of 35 or greater.  It would be recommended that he ingest cold water fish such as salmon or tuna.  If tolerated, he could use over-the-counter preparation niacin extended release 500 mg daily at bedtime.  All cultures were negative.  His discharge status is improved and prognosis is good.  DISCHARGE INSTRUCTIONS:  He will continue to use the Ace wrap and take the Keflex for an additional five days following discharge.  FOLLOWUP:  He will follow up with Dr. Chales Abrahams and Dr. Jorja Phillips long-term.  His primary care will be monitored by Dr. Corwin Levins.  DIET:  Diet will be high protein, low salt.  ACTIVITIES:  Activities will be limited walking until the swelling is optimally controlled.  Long-term regular exercise would be recommended such as a stationary bike or fish-style swimming as an aid in raising the HDL. DD:  10/18/99 TD:  10/20/99 Job: [id number]

## 2010-09-17 ENCOUNTER — Other Ambulatory Visit: Payer: Self-pay | Admitting: Internal Medicine

## 2011-05-11 ENCOUNTER — Encounter: Payer: 59 | Admitting: Internal Medicine

## 2011-05-12 ENCOUNTER — Encounter: Payer: 59 | Admitting: Internal Medicine

## 2011-05-13 ENCOUNTER — Encounter: Payer: 59 | Admitting: Internal Medicine

## 2011-05-13 ENCOUNTER — Encounter: Payer: Self-pay | Admitting: Gastroenterology

## 2011-05-13 ENCOUNTER — Telehealth: Payer: Self-pay | Admitting: Gastroenterology

## 2011-05-13 ENCOUNTER — Ambulatory Visit (INDEPENDENT_AMBULATORY_CARE_PROVIDER_SITE_OTHER): Payer: 59 | Admitting: Internal Medicine

## 2011-05-13 ENCOUNTER — Encounter: Payer: Self-pay | Admitting: Internal Medicine

## 2011-05-13 VITALS — BP 118/78 | HR 69 | Temp 98.3°F | Resp 12 | Ht 66.0 in | Wt 168.0 lb

## 2011-05-13 DIAGNOSIS — C6290 Malignant neoplasm of unspecified testis, unspecified whether descended or undescended: Secondary | ICD-10-CM

## 2011-05-13 DIAGNOSIS — Z0000 Encounter for general adult medical examination without abnormal findings: Secondary | ICD-10-CM

## 2011-05-13 DIAGNOSIS — J45909 Unspecified asthma, uncomplicated: Secondary | ICD-10-CM

## 2011-05-13 DIAGNOSIS — E782 Mixed hyperlipidemia: Secondary | ICD-10-CM

## 2011-05-13 DIAGNOSIS — N529 Male erectile dysfunction, unspecified: Secondary | ICD-10-CM

## 2011-05-13 DIAGNOSIS — Z86010 Personal history of colonic polyps: Secondary | ICD-10-CM

## 2011-05-13 LAB — LIPID PANEL
Cholesterol: 224 mg/dL — ABNORMAL HIGH (ref 0–200)
HDL: 61.6 mg/dL (ref >39.00)
Triglycerides: 106 mg/dL (ref 0.0–149.0)
VLDL: 21.2 mg/dL (ref 0.0–40.0)

## 2011-05-13 LAB — CBC WITH DIFFERENTIAL/PLATELET
Basophils Absolute: 0.1 10*3/uL (ref 0.0–0.1)
Eosinophils Absolute: 0.4 10*3/uL (ref 0.0–0.7)
HCT: 47 % (ref 39.0–52.0)
Hemoglobin: 15.8 g/dL (ref 13.0–17.0)
Lymphs Abs: 0.9 10*3/uL (ref 0.7–4.0)
MCHC: 33.7 g/dL (ref 30.0–36.0)
MCV: 90.2 fl (ref 78.0–100.0)
Monocytes Absolute: 0.8 10*3/uL (ref 0.1–1.0)
Neutro Abs: 4.6 10*3/uL (ref 1.4–7.7)
RDW: 13.8 % (ref 11.5–14.6)

## 2011-05-13 LAB — BASIC METABOLIC PANEL
BUN: 16 mg/dL (ref 6–23)
CO2: 29 mEq/L (ref 19–32)
Glucose, Bld: 105 mg/dL — ABNORMAL HIGH (ref 70–99)
Potassium: 3.9 mEq/L (ref 3.5–5.1)
Sodium: 139 mEq/L (ref 135–145)

## 2011-05-13 LAB — HEPATIC FUNCTION PANEL
AST: 24 U/L (ref 0–37)
Albumin: 4.3 g/dL (ref 3.5–5.2)

## 2011-05-13 MED ORDER — SILDENAFIL CITRATE 100 MG PO TABS: 100.0000 mg | ORAL_TABLET | ORAL | Status: DC | PRN

## 2011-05-13 MED ORDER — ZOLPIDEM TARTRATE 5 MG PO TABS: ORAL_TABLET | ORAL | Status: DC

## 2011-05-13 NOTE — Telephone Encounter (Signed)
Recall letter sent

## 2011-05-13 NOTE — Assessment & Plan Note (Signed)
He presents with a cough present for 6 months. Chest x-ray will be performed. This is most likely chronic persistent asthma due to absence of maintenance therapy.

## 2011-05-13 NOTE — Patient Instructions (Signed)
Preventive Health Care: Exercise at least 30-45 minutes a day,  3-4 days a week.  Eat a low-fat diet with lots of fruits and vegetables, up to 7-9 servings per day. Consume less than 40 grams of sugar per day from foods & drinks with High Fructose Corn Sugar as # 1,2,3 or # 4 on label. Alcohol If you drink, do it moderately,less than 9 drinks per week, preferably less than 6 @ most. Advair  Sample one inhalation every 12 hours; gargle and spit after use .  I'll Dr. Jarold Motto to determine when you need a followup colonoscopy because of a tubular adenoma resected 2010

## 2011-05-13 NOTE — Assessment & Plan Note (Signed)
He is on no specific diet; he walks an hour once a week. The significance of the advanced cholesterol test  discussed.

## 2011-05-13 NOTE — Progress Notes (Signed)
Subjective:    Patient ID: Gregory Phillips, male    DOB: Sep 27, 1955, 56 y.o.   MRN: [id number]  HPI  Mr. Bleier  is here for a physical;acute issues include cough x 6 months.      Review of Systems  COUGH: Trigger:no Course:stable Treatment/efficacy:Dr Floyd Callas treating Asthma; Singulair and Nasonex have helped his breathing. Apparently maintenance inhalers were recommended as well as allergy shots; he did not feel he could be this regimented. Associated symptoms/signs:  URI symptoms: No facial pain, frontal headaches, nasal purulence, dental pain,sorethroat, or ear ache/otic discharge Extrinsic symptoms: No itchy eyes, sneezing, or angioedema Infectious symptoms: No fever, chills, sweats, and purulent secretions Chest symptoms: No pleuritic pain, sputum production, hemoptysis. Intermittent ? exertional dyspnea &  wheezing. The wheezing has improved GI symptoms: No dyspepsia, dysphagia, reflux symptoms Occupational/environmental exposures:no Smoking history:never ACE inhibitor administration:no Past medical history/family history pulmonary disease:  Sister asthma also     Objective:   Physical Exam Gen.: Healthy and well-nourished in appearance. Alert, appropriate and cooperative throughout exam. Head: Normocephalic without obvious abnormalities;  no alopecia  Eyes: No corneal or conjunctival inflammation noted. Pupils equal round reactive to light and accommodation. Fundal exam is benign without hemorrhages, exudate, papilledema. Extraocular motion intact. Vision grossly normal. Ears: External  ear exam reveals no significant lesions or deformities. Canals clear .TMs normal. Hearing is grossly normal bilaterally. Nose: External nasal exam reveals no deformity or inflammation. Nasal mucosa are pink and moist. No lesions or exudates noted.  Mouth: Oral mucosa and oropharynx reveal no lesions or exudates. Teeth in good repair. Neck: No deformities, masses, or tenderness noted. Range  of motion & Thyroid normal. Lungs: Normal respiratory effort; chest expands symmetrically. Lungs reveal diffuse, homogenous musical wheezing and rhonchi without increased work of breathing. Heart: Normal rate and rhythm. Normal S1 and S2. No gallop, click, or rub.Grade 1/6 systolic murmur . Abdomen: Bowel sounds normal; abdomen soft and nontender. No masses, organomegaly or hernias noted. Genitalia/ DRE: Surgically absent left testicle. Small external hemorrhoids present. Prostate normal with no asymmetry, enlargement, or induration                                        Musculoskeletal/extremities: No deformity or scoliosis noted of  the thoracic or lumbar spine. No clubbing, cyanosis, edema, or deformity noted. Range of motion  normal .Tone & strength  normal.Joints normal. Chronic nail changes . Vascular: Carotid, radial artery, dorsalis pedis and  posterior tibial pulses are full and equal. No bruits present. Neurologic: Alert and oriented x3. Deep tendon reflexes symmetrical and normal.          Skin: Intact without suspicious lesions ; diffuse erythematous , dry rash with some exfoliation Lymph: No cervical, axillary, or inguinal lymphadenopathy present. Psych: Mood and affect are normal. Normally interactive                                                                                         Assessment & Plan:  #1 comprehensive physical exam; no acute findings #2 see Problem  List with Assessments & Recommendations Plan: see Orders

## 2011-05-13 NOTE — Telephone Encounter (Signed)
Message copied by JOHNSON, [place] J on Wed May 13, 2011  3:59 PM ------      Message from: Harlow Mares D      Created: Wed May 13, 2011 12:24 PM      Regarding: FW: COLONOSCOPY        Please call to schedule            Thanks            ----- Message -----         From: Sheryn Bison, MD         Sent: 05/13/2011  10:08 AM           To: Pecola Lawless, MD, Harlow Mares, CMA      Subject: COLONOSCOPY                                              HE I DUE AND WE WILL CALL HIM.Marland KitchenTHANKS      ----- Message -----         From: Pecola Lawless, MD         Sent: 05/13/2011   9:47 AM           To: Sheryn Bison, MD            Please verify when follow up colonoscopy is due based on your records

## 2011-05-15 ENCOUNTER — Ambulatory Visit (INDEPENDENT_AMBULATORY_CARE_PROVIDER_SITE_OTHER)
Admission: RE | Admit: 2011-05-15 | Discharge: 2011-05-15 | Disposition: A | Discharge status: Home / Self Care | Payer: 59 | Source: Ambulatory Visit | Attending: Internal Medicine | Admitting: Internal Medicine

## 2011-05-15 DIAGNOSIS — J45909 Unspecified asthma, uncomplicated: Secondary | ICD-10-CM

## 2011-05-18 ENCOUNTER — Telehealth: Payer: Self-pay

## 2011-05-18 MED ORDER — PRAVASTATIN SODIUM 20 MG PO TABS: 20.0000 mg | ORAL_TABLET | Freq: Every day | ORAL | Status: DC

## 2011-05-18 NOTE — Telephone Encounter (Signed)
Message copied by Maurice Small on Mon May 18, 2011  7:32 AM ------      Message from: Pecola Lawless      Created: Sun May 17, 2011  9:00 AM       Please send a prescription for Pravastatin 20 mg; dispense 90, daily qhs.

## 2011-05-18 NOTE — Telephone Encounter (Signed)
RX mailed with labs  

## 2011-05-29 ENCOUNTER — Encounter: Payer: 59 | Admitting: Gastroenterology

## 2011-06-02 ENCOUNTER — Telehealth: Payer: Self-pay

## 2011-06-02 NOTE — Telephone Encounter (Signed)
R X 1 year

## 2011-06-02 NOTE — Telephone Encounter (Signed)
Patient is following up with you regarding the Advair that you put him on.  He says it is helping and he would like to get a prescription for it. Okay to refill?

## 2011-06-03 MED ORDER — FLUTICASONE-SALMETEROL 100-50 MCG/DOSE IN AEPB: 1.0000 | INHALATION_SPRAY | Freq: Two times a day (BID) | RESPIRATORY_TRACT | Status: DC

## 2011-06-03 NOTE — Telephone Encounter (Signed)
Prescription sent to pharmacy.

## 2011-06-04 ENCOUNTER — Telehealth: Payer: Self-pay | Admitting: *Deleted

## 2011-06-04 NOTE — Telephone Encounter (Signed)
Pt calling wanting to know result of chest x-ray. .Please advise

## 2011-06-04 NOTE — Telephone Encounter (Signed)
Discuss with patient  

## 2011-06-04 NOTE — Telephone Encounter (Signed)
There are no active findings on chest x-ray; the atelectasis and effusion have resolved.

## 2011-07-06 ENCOUNTER — Encounter: Payer: Self-pay | Admitting: Internal Medicine

## 2011-07-06 ENCOUNTER — Telehealth: Payer: Self-pay | Admitting: Internal Medicine

## 2011-07-06 ENCOUNTER — Ambulatory Visit (INDEPENDENT_AMBULATORY_CARE_PROVIDER_SITE_OTHER): Payer: 59 | Admitting: Internal Medicine

## 2011-07-06 VITALS — BP 118/82 | HR 91 | Temp 98.7°F | Wt 162.0 lb

## 2011-07-06 DIAGNOSIS — L03116 Cellulitis of left lower limb: Secondary | ICD-10-CM

## 2011-07-06 DIAGNOSIS — R600 Localized edema: Secondary | ICD-10-CM

## 2011-07-06 DIAGNOSIS — L02419 Cutaneous abscess of limb, unspecified: Secondary | ICD-10-CM

## 2011-07-06 DIAGNOSIS — R609 Edema, unspecified: Secondary | ICD-10-CM

## 2011-07-06 LAB — D-DIMER, QUANTITATIVE: D-Dimer, Quant: 0.22 ug/mL-FEU (ref 0.00–0.48)

## 2011-07-06 LAB — CBC WITH DIFFERENTIAL/PLATELET
Basophils Absolute: 0 10*3/uL (ref 0.0–0.1)
Basophils Relative: 0.2 % (ref 0.0–3.0)
Eosinophils Absolute: 0.3 10*3/uL (ref 0.0–0.7)
Lymphocytes Relative: 5.4 % — ABNORMAL LOW (ref 12.0–46.0)
MCHC: 33.7 g/dL (ref 30.0–36.0)
Neutrophils Relative %: 85.6 % — ABNORMAL HIGH (ref 43.0–77.0)
Platelets: 199 10*3/uL (ref 150.0–400.0)
RBC: 5.15 Mil/uL (ref 4.22–5.81)
WBC: 12.2 10*3/uL — ABNORMAL HIGH (ref 4.5–10.5)

## 2011-07-06 MED ORDER — DOXYCYCLINE HYCLATE 100 MG PO TABS: 100.0000 mg | ORAL_TABLET | Freq: Two times a day (BID) | ORAL | Status: DC

## 2011-07-06 NOTE — Patient Instructions (Addendum)
To ER with this report if pain persists or is associaled with Warning Signs as discussed . Mark the area of erythema each day to verify whether it is  indeed resolving. NSAIDS ( Aleve, Advil, Naproxen) or Tylenol every 4 hrs as needed for fever as discussed based on label recommendations  Please try to go on My Chart within the next 24 hours to allow me to release the results directly to you.

## 2011-07-06 NOTE — Telephone Encounter (Signed)
Caller: Belinda/Spouse; PCP: Marga Melnick; CB#: [phone]; ; ; Call regarding Hx of Cellulitis, Lower Left Leg Is Swelling, Redness, Inflamed.;  Belinda had called for an appt. Appt scheduled by scheuler at 12:00/sent through to nurse from the office for triage. Pt has a temp of 102.6 yesterday. From the knee down on the left leg is red / inflamed and painful this am. Rn triaged/per disposition this is ED disposition. RN called the office back to speak with manager/Brittany. She offered her a 9:45 appt at the [place]. Pts wife refused/she cannot make that. She will maintain her 12:00 appt.

## 2011-07-06 NOTE — Progress Notes (Signed)
Subjective:    Patient ID: Gregory Phillips, male    DOB: 1955-10-30, 56 y.o.   MRN: [id number]  HPI He experienced the acute onset of myalgias and arthralgias diffusely at 7:30 PM on Saturday 07/04/11. He experienced a shaking chill followed by profuse diaphoresis.  He also  noted tender lymphadenopathy in the left inguinal area.  His temperature went up to 102.4 over the night of 4/13-4/14. On 4/14 he began to have diffuse erythema of the left shin and foot with progressive swelling of the foot.  Treatment has consisted of nonsteroidals orally as well as ice topically to the left foot.  He has a past history of cellulitis; in 2001 he was hospitalized for cellulitis and left foot. Approximately 2006 or 2007 he had incision and drainage of an abscess at the right elbow ; there is a question as to whether he was  hospitalized by the orthopedist.  He has chronic lymphedema in the left lower extremity      Review of Systems He denies any symptoms of upper respiratory tract infection such as purulence, frontal headache, facial pain, sore throat. He's had no sputum production, dyspnea, or hemoptysis.  There's been no associated diarrhea with this illness.  He's had no dysuria, pyuria, or hematuria.  There was no inciting skin lesion     Objective:   Physical Exam Gen.: Healthy and well-nourished in appearance. Alert, appropriate and cooperative throughout exam.   Eyes: No corneal or conjunctival inflammation noted. No scleral icterus present  Ears: External  ear exam reveals no significant lesions or deformities. Canals clear .TMs dull. Nose: External nasal exam reveals no deformity or inflammation. Nasal mucosa are pink and moist. No lesions or exudates noted.   Mouth: Oral mucosa and oropharynx reveal no lesions or exudates. Teeth in good repair. Neck: No deformities, masses, or tenderness noted. Range of motion normal Lungs: Normal respiratory effort; chest expands symmetrically.  Lungs are clear to auscultation without rales, wheezes, or increased work of breathing. Heart: Normal rate and rhythm. Normal S1 and S2. No gallop, click, or rub. S4 with slurring ; no  Murmur. Certainly there is no murmur to suggest active endocarditis Abdomen: Bowel sounds normal; abdomen soft and nontender. No masses, organomegaly or hernias noted. Musculoskeletal/extremities:  No clubbing, cyanosis. The left foot is edematous but not tender. Homans sign is negative on the left. Erythema extends to within 3 inches of the knee . Vascular: Carotid, radial artery pulses are full and equal. . The pedal pulses on the left are present but decreased due to the edema Neurologic: Alert and oriented x3. Deep tendon reflexes symmetrical and normal.          Skin: Scattered psoriatic lesions are present; the most striking finding is the erythema involving the left lower extremity Lymph: No cervical, axillary, or inguinal lymphadenopathy present. Psych: Mood and affect are normal. Normally interactive . He is intelligent and understands the risks of  outpatient therapy.  Labs will be collected and antibiotics started. If warning signs appear or the rash fails to recede parenteral therapy will be mandatory as IP.  Assessment & Plan:

## 2011-07-07 ENCOUNTER — Encounter: Payer: Self-pay | Admitting: Internal Medicine

## 2011-07-09 ENCOUNTER — Telehealth: Payer: Self-pay | Admitting: Internal Medicine

## 2011-07-09 ENCOUNTER — Observation Stay (HOSPITAL_COMMUNITY)
Admission: EM | Admit: 2011-07-09 | Discharge: 2011-07-09 | Disposition: A | Discharge status: Home / Self Care | Payer: 59 | Source: Ambulatory Visit | Attending: Emergency Medicine | Admitting: Emergency Medicine

## 2011-07-09 ENCOUNTER — Encounter: Payer: Self-pay | Admitting: Internal Medicine

## 2011-07-09 ENCOUNTER — Encounter (HOSPITAL_COMMUNITY): Payer: Self-pay | Admitting: Emergency Medicine

## 2011-07-09 DIAGNOSIS — L03119 Cellulitis of unspecified part of limb: Secondary | ICD-10-CM | POA: Insufficient documentation

## 2011-07-09 DIAGNOSIS — L03116 Cellulitis of left lower limb: Secondary | ICD-10-CM

## 2011-07-09 DIAGNOSIS — L02419 Cutaneous abscess of limb, unspecified: Principal | ICD-10-CM | POA: Insufficient documentation

## 2011-07-09 HISTORY — DX: Edema, unspecified: R60.9

## 2011-07-09 LAB — CBC
MCV: 88.9 fL (ref 78.0–100.0)
Platelets: 250 10*3/uL (ref 150–400)
RBC: 4.97 MIL/uL (ref 4.22–5.81)
RDW: 13.1 % (ref 11.5–15.5)
WBC: 6.8 10*3/uL (ref 4.0–10.5)

## 2011-07-09 LAB — DIFFERENTIAL
Basophils Absolute: 0 10*3/uL (ref 0.0–0.1)
Lymphocytes Relative: 13 % (ref 12–46)
Lymphs Abs: 0.9 10*3/uL (ref 0.7–4.0)
Neutro Abs: 4.7 10*3/uL (ref 1.7–7.7)
Neutrophils Relative %: 69 % (ref 43–77)

## 2011-07-09 LAB — BASIC METABOLIC PANEL
CO2: 26 mEq/L (ref 19–32)
Chloride: 105 mEq/L (ref 96–112)
Glucose, Bld: 97 mg/dL (ref 70–99)
Potassium: 3.5 mEq/L (ref 3.5–5.1)
Sodium: 142 mEq/L (ref 135–145)

## 2011-07-09 MED ORDER — MORPHINE SULFATE 4 MG/ML IJ SOLN: 4.0000 mg | INTRAMUSCULAR | Status: DC | PRN

## 2011-07-09 MED ORDER — CEPHALEXIN 500 MG PO CAPS: 500.0000 mg | ORAL_CAPSULE | Freq: Four times a day (QID) | ORAL | Status: AC

## 2011-07-09 MED ORDER — ONDANSETRON HCL 4 MG/2ML IJ SOLN: 4.0000 mg | Freq: Four times a day (QID) | INTRAMUSCULAR | Status: DC | PRN

## 2011-07-09 MED ORDER — SODIUM CHLORIDE 0.9 % IV SOLN
3.0000 g | Freq: Once | INTRAVENOUS | Status: AC
  Administered 2011-07-09: 3 g via INTRAVENOUS
  Filled 2011-07-09: qty 3

## 2011-07-09 MED ORDER — VANCOMYCIN HCL IN DEXTROSE 1-5 GM/200ML-% IV SOLN: 1000.0000 mg | Freq: Once | INTRAVENOUS | Status: DC

## 2011-07-09 MED ORDER — SODIUM CHLORIDE 0.9 % IV SOLN
3.0000 g | Freq: Four times a day (QID) | INTRAVENOUS | Status: DC
  Administered 2011-07-09: 3 g via INTRAVENOUS
  Filled 2011-07-09: qty 3

## 2011-07-09 MED ORDER — SODIUM CHLORIDE 0.9 % IV SOLN: 1000.0000 mL | INTRAVENOUS | Status: DC

## 2011-07-09 NOTE — ED Notes (Signed)
Onset 5 days ago fever and redness left lower extremity below the knee seen Primary Doctor 4 days ago redness radiating below the knee to left foot with edema +3.  Haven been taking oral antibiotics 4 days sent to ED for evaluation.  Pain 0/10.

## 2011-07-09 NOTE — ED Provider Notes (Signed)
Patient has completed his second dose of unasyn.  Left lower leg erythema and edema persist, but has improved.  Distal pulses/sensation/movement intact.    Jimmye Norman, NP 07/09/11 [phone]

## 2011-07-09 NOTE — ED Notes (Signed)
Patient transported to X-ray 

## 2011-07-09 NOTE — ED Notes (Signed)
Pt moved to CDU 3

## 2011-07-09 NOTE — Progress Notes (Signed)
Observation review is complete. 

## 2011-07-09 NOTE — Telephone Encounter (Signed)
Caller: Belinda/Spouse; PCP: Marga Melnick; CB#: [phone]; Call regarding Ongoing Cellulitis of Left Leg with no improvement on  Antibiotics;  Cellulitis of L lower leg present since 07/04/11.  On Doxicycline 100 mb BID since 07/06/11.  L lower leg very swollen but not weeping. Redness/swelling has not spread above pen marks.  Called re no progress; may need inpt admission for IV antibiotics. Contacted office nurse/Sharay d/t see now disposition vs ED for pt already seen.  States MD notes from visit 07/06/11 are to go to ED if no improvement or worsening symptoms and take office note with them.  Pt advised to go to [facility] ED now.

## 2011-07-09 NOTE — Discharge Instructions (Signed)
Cellulitis Cellulitis is an infection of the skin and the tissue beneath it. The area is typically red and tender. It is caused by germs (bacteria) (usually staph or strep) that enter the body through cuts or sores. Cellulitis most commonly occurs in the arms or lower legs.  HOME CARE INSTRUCTIONS   If you are given a prescription for medications which kill germs (antibiotics), take as directed until finished.   If the infection is on the arm or leg, keep the limb elevated as able.   Use a warm cloth several times per day to relieve pain and encourage healing.   See your caregiver for recheck of the infected site as directed if problems arise.   Only take over-the-counter or prescription medicines for pain, discomfort, or fever as directed by your caregiver.  SEEK MEDICAL CARE IF:   The area of redness (inflammation) is spreading, there are red streaks coming from the infected site, or if a part of the infection begins to turn dark in color.   The joint or bone underneath the infected skin becomes painful after the skin has healed.   The infection returns in the same or another area after it seems to have gone away.   A boil or bump swells up. This may be an abscess.   New, unexplained problems such as pain or fever develop.  SEEK IMMEDIATE MEDICAL CARE IF:   You have a fever.   You or your child feels drowsy or lethargic.   There is vomiting, diarrhea, or lasting discomfort or feeling ill (malaise) with muscle aches and pains.  MAKE SURE YOU:   Understand these instructions.   Will watch your condition.   Will get help right away if you are not doing well or get worse.  Document Released: 12/17/2004 Document Revised: 02/26/2011 Document Reviewed: 10/26/2007 [facility] Patient Information 2012 [place], [place].  CONTINUE WITH THE DOXYCYCLINE.  ADD THE KEFLEX AS DIRECTED.  CONTACT YOUR PRIMARY CARE PROVIDER AND/OR RETURN TO THE ED IF YOU DO NOT HAVE CONTINUED IMPROVEMENT IN  SYMPTOMS OVER THE NEXT SEVERAL DAYS DESPITE TREATMENT.

## 2011-07-09 NOTE — Telephone Encounter (Signed)
IV antibiotics will undoubtedly be necessary to eradicate the infection and prevent complications of cellulitis such as clots in the leg. He should go to the emergency room for probable admission.

## 2011-07-09 NOTE — ED Provider Notes (Signed)
Assumed pt care @ 1247. Pt with left lower extremity cellulitis that fail outpt treatment with doxy prescribed by his PCP Dr. Alwyn Ren 3 days ago.  Pt has been evaluated by Tessa Lerner.  PA-C.  Plan to give 2 doses of Unasyn (second dose @ 7pm) and d/c with keflex po on top of his current doxycycline.  No evidence of joint involvement.  Pt is afebrile and in NAD.   Fayrene Helper, PA-C 07/10/11 1502

## 2011-07-09 NOTE — ED Notes (Signed)
Pt. Arrived from Pod. Stable, red, swelling and warmth noted  to her lt. Lower extremity  Pt. Being placed on Cellulitis protocol.

## 2011-07-09 NOTE — ED Notes (Signed)
Onset 4-5 days ago fever seen Doctor 3 days ago taking oral antibiotics for cellulitis left lower extremity below the knee radiating down to left foot.  Patient has history of edema only in left foot years ago +3 full sensation bilateral equal pedal pulse +1 left foot and +2 right foot.  Denies any pain 0/10.  Resting comfortably on stretcher with wife at bedside. Airway intact bilateral equal chest rise and fall.

## 2011-07-09 NOTE — ED Provider Notes (Signed)
History     CSN: [id number]  Arrival date & time 07/09/11  1057   First MD Initiated Contact with Patient 07/09/11 1100      No chief complaint on file.   (Consider location/radiation/quality/duration/timing/severity/associated sxs/prior treatment) Patient is a 56 y.o. male presenting with lower extremity pain. The history is provided by the patient.  Foot Pain This is a new problem. The current episode started in the past 7 days. The problem occurs constantly. The problem has been unchanged. Associated symptoms include chills, a fever, joint swelling and myalgias. Pertinent negatives include no numbness or weakness.  Pt states 5 days ago developed a fever of 102, states had generalized body aches. States fever resolved the next day but noted left foot swollen, red, up to the knee. Hx of the same, was diagnosed with cellulitis. States he went to his PCP, Dr. Alwyn Ren, started on doxycyclin 3 days ago. States no improvement. Today called Dr. Alwyn Ren, was told to come to ER for iv antibiotics and possible admission. Pt stats he is no longer having fever, no particular pain in the foot, just pressure.   Past Medical History  Diagnosis Date  . Hyperlipidemia   . Asthma   . Eczema   . Cellulitis 2001     LLE ; [facility]  . Cellulitis 2007    R elbow ; S/ P drainage by Dr Priscille Kluver    Past Surgical History  Procedure Date  . Seminoma  resection 2005     Dr Annabell Howells  . Arthroscopy of knee      X 2 Dr Thurston Hole; 2 nd surgery for patellar instability  . Refractive surgery 2004  . Colonoscopy w/ polypectomy 05/14/2008    Tubular adenoma; Dr. Sheryn Bison  . I&d extremity 2004    elbow    Family History  Problem Relation Age of Onset  . Cancer Father     liver  . Heart disease Father     dysrrhymthmia  . Stroke Mother     mini  . Asthma Sister   . Cancer Paternal Grandfather     stomach  . Eczema Maternal Uncle     History  Substance Use Topics  . Smoking status:  Never Smoker   . Smokeless tobacco: Not on file  . Alcohol Use: 12.6 oz/week    21 Cans of beer per week      Review of Systems  Constitutional: Positive for fever and chills.  Respiratory: Negative.   Cardiovascular: Negative.   Gastrointestinal: Negative.   Genitourinary: Negative.   Musculoskeletal: Positive for myalgias and joint swelling.  Skin: Positive for color change.  Neurological: Negative for weakness and numbness.    Allergies  Review of patient's allergies indicates no known allergies.  Home Medications   Current Outpatient Rx  Name Route Sig Dispense Refill  . BETAMETHASONE DIPROPIONATE 0.05 % EX OINT Topical Apply topically daily.    . DESONIDE 0.05 % EX OINT Topical Apply topically daily.    Marland Kitchen DOXYCYCLINE HYCLATE 100 MG PO TABS Oral Take 1 tablet (100 mg total) by mouth 2 (two) times daily. 20 tablet 0  . FLUTICASONE-SALMETEROL 100-50 MCG/DOSE IN AEPB Inhalation Inhale 1 puff into the lungs 2 (two) times daily. 1 each 11  . MOMETASONE FUROATE 50 MCG/ACT NA SUSP Nasal Place 2 sprays into the nose daily.    Marland Kitchen MONTELUKAST SODIUM 10 MG PO TABS Oral Take 10 mg by mouth at bedtime.    Marland Kitchen PRAVASTATIN SODIUM 20 MG  PO TABS Oral Take 1 tablet (20 mg total) by mouth daily. 90 tablet 0  . SILDENAFIL CITRATE 100 MG PO TABS Oral Take 1 tablet (100 mg total) by mouth as needed for erectile dysfunction. 12 tablet 5  . ZOLPIDEM TARTRATE 5 MG PO TABS  1 every 3 days prn 10 tablet 2    There were no vitals taken for this visit.  Physical Exam  Nursing note and vitals reviewed. Constitutional: He is oriented to person, place, and time. He appears well-developed and well-nourished. No distress.  HENT:  Head: Normocephalic.  Eyes: Conjunctivae are normal.  Neck: Neck supple.  Cardiovascular: Normal rate, regular rhythm and normal heart sounds.   Pulmonary/Chest: Effort normal and breath sounds normal. No respiratory distress.  Musculoskeletal:       Swelling of entire left  lower extremity from just distal to the knee down. This are has erythema, warm to the touch, 2+ pitting edema. No pain. No calf tenderness, negative homan's sign. Normal dorsal pedal pulse. No cuts of abrasions on the leg  Neurological: He is alert and oriented to person, place, and time. He exhibits normal muscle tone.  Skin: Skin is warm and dry.  Psychiatric: He has a normal mood and affect.    ED Course  Procedures (including critical care time)  Pt with lower leg cellulitis. Negative d-dimer in the office 3 days ago. Afebrile here, non toxic. Labs pending. Unasyn IV ordered.  Results for orders placed during the hospital encounter of 07/09/11  CBC      Component Value Range   WBC 6.8  4.0 - 10.5 (K/uL)   RBC 4.97  4.22 - 5.81 (MIL/uL)   Hemoglobin 14.8  13.0 - 17.0 (g/dL)   HCT 46.9  62.9 - 52.8 (%)   MCV 88.9  78.0 - 100.0 (fL)   MCH 29.8  26.0 - 34.0 (pg)   MCHC 33.5  30.0 - 36.0 (g/dL)   RDW 41.3  24.4 - 01.0 (%)   Platelets 250  150 - 400 (K/uL)  DIFFERENTIAL      Component Value Range   Neutrophils Relative 69  43 - 77 (%)   Neutro Abs 4.7  1.7 - 7.7 (K/uL)   Lymphocytes Relative 13  12 - 46 (%)   Lymphs Abs 0.9  0.7 - 4.0 (K/uL)   Monocytes Relative 10  3 - 12 (%)   Monocytes Absolute 0.7  0.1 - 1.0 (K/uL)   Eosinophils Relative 8 (*) 0 - 5 (%)   Eosinophils Absolute 0.5  0.0 - 0.7 (K/uL)   Basophils Relative 1  0 - 1 (%)   Basophils Absolute 0.0  0.0 - 0.1 (K/uL)  BASIC METABOLIC PANEL      Component Value Range   Sodium 142  135 - 145 (mEq/L)   Potassium 3.5  3.5 - 5.1 (mEq/L)   Chloride 105  96 - 112 (mEq/L)   CO2 26  19 - 32 (mEq/L)   Glucose, Bld 97  70 - 99 (mg/dL)   BUN 15  6 - 23 (mg/dL)   Creatinine, Ser 2.72  0.50 - 1.35 (mg/dL)   Calcium 9.4  8.4 - 53.6 (mg/dL)   GFR calc non Af Amer >90  >90 (mL/min)   GFR calc Af Amer >90  >90 (mL/min)   12:49 PM Labs normal. Pt reassessed, appears that cellulitis is improving. Discussed going home with pt. He  agreed to stay on protocol in CDU for another dose of IV antibiotics  and to make sure it is not worsening. Will place in CDU on protocol    Pt moved to CDU. Will be followed by PA tran there.    No diagnosis found.    MDM          Lottie Mussel, PA 07/10/11 [phone]

## 2011-07-09 NOTE — Telephone Encounter (Signed)
[facility] Triage Call Report Triage Record Num: [id number] Operator: Geanie Berlin Patient Name: Jajuan Skoog Call Date & Time: 07/09/2011 9:03:58AM Patient Phone: [phone] PCP: Marga Melnick Patient Gender: Male PCP Fax : [phone] Patient DOB: 10/15/1955 Practice Name: [facility] Reason for Call: Caller: Belinda/Spouse; PCP: Marga Melnick; CB#: [phone]; Call regarding Ongoing Cellulitis of Left Leg with no improvement on Antibiotics; Cellulitis of L lower leg present since 07/04/11. On Doxicycline 100 mb BID since 07/06/11. L lower leg very swollen but not weeping. Redness/swelling has not spread above pen marks. Called re no progress; may need inpt admission for IV antibiotics. Contacted office nurse/Sharay d/t see now disposition vs ED for pt already seen. States MD notes from visit 07/06/11 are to go to ED if no improvement or worsening symptoms and take office note with them. Pt advised to go to [facility] ED now. Protocol(s) Used: Leg Non-Injury Recommended Outcome per Protocol: See ED Immediately Reason for Outcome: Recent onset of one-sided pain, tenderness, or aching that may worsen with standing or walking OR a cord-like swelling that may feel warm, look red or discolored. Care Advice: ~ Another adult should drive. ~ Do not massage affected area if red, swollen, warm, or tender to touch OR if history of blood clots. ~ Elevate part to improve circulation and decrease discomfort. ~ IMMEDIATE ACTION

## 2011-07-10 NOTE — ED Provider Notes (Signed)
Medical screening examination/treatment/procedure(s) were performed by non-physician practitioner and as supervising physician I was immediately available for consultation/collaboration.    Dwaine Pringle R Wren Gallaga, MD 07/10/11 1624 

## 2011-07-17 ENCOUNTER — Encounter: Payer: 59 | Admitting: Gastroenterology

## 2011-08-31 ENCOUNTER — Encounter (HOSPITAL_COMMUNITY): Payer: Self-pay | Admitting: *Deleted

## 2011-08-31 ENCOUNTER — Inpatient Hospital Stay (HOSPITAL_COMMUNITY)
Admission: EM | Admit: 2011-08-31 | Discharge: 2011-09-05 | DRG: 603 | Disposition: A | Discharge status: Home / Self Care | Payer: 59 | Attending: Internal Medicine | Admitting: Internal Medicine

## 2011-08-31 DIAGNOSIS — R6510 Systemic inflammatory response syndrome (SIRS) of non-infectious origin without acute organ dysfunction: Secondary | ICD-10-CM | POA: Diagnosis present

## 2011-08-31 DIAGNOSIS — L2089 Other atopic dermatitis: Secondary | ICD-10-CM

## 2011-08-31 DIAGNOSIS — L259 Unspecified contact dermatitis, unspecified cause: Secondary | ICD-10-CM | POA: Diagnosis present

## 2011-08-31 DIAGNOSIS — J45909 Unspecified asthma, uncomplicated: Secondary | ICD-10-CM | POA: Diagnosis present

## 2011-08-31 DIAGNOSIS — L03116 Cellulitis of left lower limb: Secondary | ICD-10-CM

## 2011-08-31 DIAGNOSIS — I890 Lymphedema, not elsewhere classified: Secondary | ICD-10-CM | POA: Diagnosis present

## 2011-08-31 DIAGNOSIS — E785 Hyperlipidemia, unspecified: Secondary | ICD-10-CM | POA: Diagnosis present

## 2011-08-31 DIAGNOSIS — L02419 Cutaneous abscess of limb, unspecified: Principal | ICD-10-CM | POA: Diagnosis present

## 2011-08-31 DIAGNOSIS — Z86010 Personal history of colonic polyps: Secondary | ICD-10-CM

## 2011-08-31 DIAGNOSIS — L0390 Cellulitis, unspecified: Secondary | ICD-10-CM

## 2011-08-31 DIAGNOSIS — E782 Mixed hyperlipidemia: Secondary | ICD-10-CM

## 2011-08-31 DIAGNOSIS — C6290 Malignant neoplasm of unspecified testis, unspecified whether descended or undescended: Secondary | ICD-10-CM

## 2011-08-31 LAB — CBC
Hemoglobin: 14.7 g/dL (ref 13.0–17.0)
MCH: 30.3 pg (ref 26.0–34.0)
MCV: 87.8 fL (ref 78.0–100.0)
Platelets: 226 10*3/uL (ref 150–400)
RBC: 4.85 MIL/uL (ref 4.22–5.81)
WBC: 23.6 10*3/uL — ABNORMAL HIGH (ref 4.0–10.5)

## 2011-08-31 LAB — POCT I-STAT, CHEM 8
Glucose, Bld: 110 mg/dL — ABNORMAL HIGH (ref 70–99)
HCT: 47 % (ref 39.0–52.0)
Hemoglobin: 16 g/dL (ref 13.0–17.0)
Potassium: 3.7 mEq/L (ref 3.5–5.1)
Sodium: 138 mEq/L (ref 135–145)

## 2011-08-31 LAB — DIFFERENTIAL
Eosinophils Absolute: 0.1 10*3/uL (ref 0.0–0.7)
Eosinophils Relative: 0 % (ref 0–5)
Lymphocytes Relative: 3 % — ABNORMAL LOW (ref 12–46)
Lymphs Abs: 0.6 10*3/uL — ABNORMAL LOW (ref 0.7–4.0)
Monocytes Relative: 3 % (ref 3–12)

## 2011-08-31 MED ORDER — AMPICILLIN-SULBACTAM SODIUM 3 (2-1) G IJ SOLR
3.0000 g | Freq: Once | INTRAMUSCULAR | Status: AC
  Administered 2011-08-31: 3 g via INTRAVENOUS
  Filled 2011-08-31: qty 3

## 2011-08-31 MED ORDER — SODIUM CHLORIDE 0.9 % IV BOLUS (SEPSIS)
1000.0000 mL | Freq: Once | INTRAVENOUS | Status: AC
  Administered 2011-08-31: 1000 mL via INTRAVENOUS

## 2011-08-31 NOTE — ED Notes (Signed)
The pt has had cellulitis in his lt lower leg in April  Since this  Am he has had some redness that has started in the lt lower leg again.  He wants it checked

## 2011-08-31 NOTE — ED Provider Notes (Signed)
History     CSN: [id number]  Arrival date & time 08/31/11  2024   First MD Initiated Contact with Patient 08/31/11 2159      Chief Complaint  Patient presents with  . Leg Pain    (Consider location/radiation/quality/duration/timing/severity/associated sxs/prior treatment) HPI Comments: Patient here with acute onset of left leg pain, redness and swelling - reports a history of cellulitis in the LLE in April - was seen at PCP, placed on oral abx then but no improvement and was subsequently seen here and admitted to the CDU unit for 24 hours of unasyn with resolution of symptoms.  States that starting about 11am this morning began to have pain in the lower extremity starting at the knee, reports fever and chills with fever to 102 orally - states swelling and redness then spread to foot.  Denies any injury to the leg, diabetes, venous or arterial insufficiency.  States that did not try to see PCP because he felt so bad and decided to come here.  Patient is a 56 y.o. male presenting with leg pain. The history is provided by the patient and the spouse. No language interpreter was used.  Leg Pain  The incident occurred 6 to 12 hours ago. The incident occurred at home. There was no injury mechanism. The pain is present in the left leg. The quality of the pain is described as aching. The pain is at a severity of 4/10. The pain is moderate. The pain has been constant since onset. Pertinent negatives include no numbness, no inability to bear weight, no loss of motion, no muscle weakness, no loss of sensation and no tingling. He reports no foreign bodies present. He has tried nothing for the symptoms. The treatment provided no relief.    Past Medical History  Diagnosis Date  . Hyperlipidemia   . Asthma   . Eczema   . Cellulitis 2001     LLE ; [facility]  . Cellulitis 2007    R elbow ; S/ P drainage by Dr Priscille Kluver  . Edema     Past Surgical History  Procedure Date  . Seminoma   resection 2005     Dr Annabell Howells  . Arthroscopy of knee      X 2 Dr Thurston Hole; 2 nd surgery for patellar instability  . Refractive surgery 2004  . Colonoscopy w/ polypectomy 05/14/2008    Tubular adenoma; Dr. Sheryn Bison  . I&d extremity 2004    elbow    Family History  Problem Relation Age of Onset  . Cancer Father     liver  . Heart disease Father     dysrrhymthmia  . Stroke Mother     mini  . Asthma Sister   . Cancer Paternal Grandfather     stomach  . Eczema Maternal Uncle     History  Substance Use Topics  . Smoking status: Never Smoker   . Smokeless tobacco: Not on file  . Alcohol Use: 0.0 oz/week      Review of Systems  Constitutional: Positive for fever and chills.  HENT: Negative for neck pain and sinus pressure.   Eyes: Negative for pain.  Respiratory: Negative for chest tightness and shortness of breath.   Cardiovascular: Negative for chest pain.  Gastrointestinal: Positive for nausea. Negative for vomiting and abdominal pain.  Genitourinary: Negative for dysuria.  Musculoskeletal: Positive for myalgias.  Skin: Positive for color change.  Neurological: Negative for tingling, numbness and headaches.  All other systems reviewed and  are negative.    Allergies  Review of patient's allergies indicates no known allergies.  Home Medications   Current Outpatient Rx  Name Route Sig Dispense Refill  . BETAMETHASONE DIPROPIONATE 0.05 % EX OINT Topical Apply 1 application topically at bedtime.     . DESONIDE 0.05 % EX OINT Topical Apply 1 application topically at bedtime.     Marland Kitchen FLUTICASONE-SALMETEROL 100-50 MCG/DOSE IN AEPB Inhalation Inhale 1 puff into the lungs 2 (two) times daily.    . MOMETASONE FUROATE 50 MCG/ACT NA SUSP Nasal Place 2 sprays into the nose daily.    Marland Kitchen MONTELUKAST SODIUM 10 MG PO TABS Oral Take 10 mg by mouth at bedtime.    Marland Kitchen PRAVASTATIN SODIUM 20 MG PO TABS Oral Take 20 mg by mouth at bedtime.     Marland Kitchen SILDENAFIL CITRATE 100 MG PO TABS Oral  Take 100 mg by mouth as needed. As needed for erectile dysfunction.    Marland Kitchen ZOLPIDEM TARTRATE 5 MG PO TABS Oral Take 5 mg by mouth at bedtime as needed. For sleep      BP 122/76  Pulse 101  Temp(Src) 98.7 F (37.1 C) (Oral)  Resp 18  SpO2 98%  Physical Exam  Nursing note and vitals reviewed. Constitutional: He is oriented to person, place, and time. He appears well-developed and well-nourished. No distress.  HENT:  Head: Normocephalic and atraumatic.  Right Ear: External ear normal.  Left Ear: External ear normal.  Nose: Nose normal.  Mouth/Throat: Oropharynx is clear and moist. No oropharyngeal exudate.  Eyes: Conjunctivae are normal. Pupils are equal, round, and reactive to light. No scleral icterus.  Neck: Normal range of motion. Neck supple.  Cardiovascular: Normal rate and normal heart sounds.  Exam reveals no gallop and no friction rub.   No murmur heard. Pulmonary/Chest: Effort normal and breath sounds normal. No respiratory distress. He has no wheezes. He has no rales. He exhibits no tenderness.  Abdominal: Soft. Bowel sounds are normal. He exhibits no distension. There is no tenderness.  Musculoskeletal: He exhibits edema and tenderness.       LLE edema and erythema noted from knee to foot - 2+ DP and PT pulses, no pitting to the edema, negative Homan's and no palpable cord to calf.  Lymphadenopathy:    He has no cervical adenopathy.  Neurological: He is alert and oriented to person, place, and time. No cranial nerve deficit. He exhibits normal muscle tone. Coordination normal.  Skin: Skin is warm and dry. There is erythema.  Psychiatric: He has a normal mood and affect. His behavior is normal. Judgment and thought content normal.    ED Course  Procedures (including critical care time)  Labs Reviewed  CBC - Abnormal; Notable for the following:    WBC 23.6 (*)    All other components within normal limits  DIFFERENTIAL - Abnormal; Notable for the following:     Neutrophils Relative 94 (*)    Neutro Abs 22.2 (*)    Lymphocytes Relative 3 (*)    Lymphs Abs 0.6 (*)    All other components within normal limits  POCT I-STAT, CHEM 8 - Abnormal; Notable for the following:    Glucose, Bld 110 (*)    Calcium, Ion 1.11 (*)    All other components within normal limits   No results found.   LLE Cellulitis   MDM  Patient here with acute onset of cellulitis of the left lower extremity - history of cellulitis in the same area -  given a dose of unasyn here but because of the marked leukocytosis, will plan to admit to the hospital for IV abx and further evaluation of this.        Izola Price [place], [place] 08/31/11 2340

## 2011-08-31 NOTE — ED Provider Notes (Signed)
56 year old male fell redness and pain in his left leg today. He has had cellulitis in that leg on 2 other occasions. On exam, his left leg is erythematous from the toes to above the knee. It is not severely painful and there is no crepitus. Negative Homans sign. His WBC is markedly elevated and he will be admitted for IV antibiotics. I am concerned about 3 episodes of cellulitis in the same leg and wonder if he has some sort of vascular insufficiency that is predisposing him to recurrent episodes in that leg. He does have psoriasis but plaques are on both legs.  Dione Booze, MD 08/31/11 [phone]

## 2011-09-01 ENCOUNTER — Encounter (HOSPITAL_COMMUNITY): Payer: Self-pay | Admitting: Internal Medicine

## 2011-09-01 DIAGNOSIS — L2089 Other atopic dermatitis: Secondary | ICD-10-CM

## 2011-09-01 DIAGNOSIS — L03119 Cellulitis of unspecified part of limb: Secondary | ICD-10-CM

## 2011-09-01 DIAGNOSIS — J45909 Unspecified asthma, uncomplicated: Secondary | ICD-10-CM

## 2011-09-01 DIAGNOSIS — L03116 Cellulitis of left lower limb: Secondary | ICD-10-CM | POA: Diagnosis present

## 2011-09-01 DIAGNOSIS — L02419 Cutaneous abscess of limb, unspecified: Secondary | ICD-10-CM

## 2011-09-01 LAB — COMPREHENSIVE METABOLIC PANEL
ALT: 16 U/L (ref 0–53)
AST: 16 U/L (ref 0–37)
Albumin: 3.3 g/dL — ABNORMAL LOW (ref 3.5–5.2)
CO2: 25 mEq/L (ref 19–32)
Calcium: 8.6 mg/dL (ref 8.4–10.5)
GFR calc non Af Amer: >90 mL/min (ref >90)
Sodium: 138 mEq/L (ref 135–145)
Total Protein: 6.7 g/dL (ref 6.0–8.3)

## 2011-09-01 LAB — CBC
HCT: 42.1 % (ref 39.0–52.0)
Hemoglobin: 14.1 g/dL (ref 13.0–17.0)
MCH: 29.7 pg (ref 26.0–34.0)
MCHC: 33.5 g/dL (ref 30.0–36.0)
MCV: 88.8 fL (ref 78.0–100.0)

## 2011-09-01 MED ORDER — ACETAMINOPHEN 325 MG PO TABS
650.0000 mg | ORAL_TABLET | Freq: Four times a day (QID) | ORAL | Status: DC | PRN
  Administered 2011-09-01: 650 mg via ORAL
  Filled 2011-09-01: qty 2

## 2011-09-01 MED ORDER — FLUTICASONE PROPIONATE 50 MCG/ACT NA SUSP
1.0000 | Freq: Every day | NASAL | Status: DC
  Administered 2011-09-01 – 2011-09-05 (×5): 1 via NASAL
  Filled 2011-09-01: qty 16

## 2011-09-01 MED ORDER — PIPERACILLIN-TAZOBACTAM 3.375 G IVPB 30 MIN
3.3750 g | Freq: Once | INTRAVENOUS | Status: AC
  Administered 2011-09-01: 3.375 g via INTRAVENOUS
  Filled 2011-09-01: qty 50

## 2011-09-01 MED ORDER — VANCOMYCIN HCL IN DEXTROSE 1-5 GM/200ML-% IV SOLN
1000.0000 mg | Freq: Once | INTRAVENOUS | Status: DC
  Filled 2011-09-01: qty 200

## 2011-09-01 MED ORDER — ZOLPIDEM TARTRATE 5 MG PO TABS: 5.0000 mg | ORAL_TABLET | Freq: Every evening | ORAL | Status: DC | PRN

## 2011-09-01 MED ORDER — SIMVASTATIN 10 MG PO TABS
10.0000 mg | ORAL_TABLET | Freq: Every day | ORAL | Status: DC
  Administered 2011-09-01 – 2011-09-04 (×4): 10 mg via ORAL
  Filled 2011-09-01 (×5): qty 1

## 2011-09-01 MED ORDER — VANCOMYCIN HCL 1000 MG IV SOLR
750.0000 mg | Freq: Two times a day (BID) | INTRAVENOUS | Status: DC
  Administered 2011-09-01 – 2011-09-02 (×2): 750 mg via INTRAVENOUS
  Filled 2011-09-01 (×3): qty 750

## 2011-09-01 MED ORDER — VANCOMYCIN HCL 1000 MG IV SOLR
750.0000 mg | Freq: Once | INTRAVENOUS | Status: AC
  Administered 2011-09-01: 750 mg via INTRAVENOUS
  Filled 2011-09-01: qty 750

## 2011-09-01 MED ORDER — ONDANSETRON HCL 4 MG PO TABS: 4.0000 mg | ORAL_TABLET | Freq: Four times a day (QID) | ORAL | Status: DC | PRN

## 2011-09-01 MED ORDER — MONTELUKAST SODIUM 10 MG PO TABS
10.0000 mg | ORAL_TABLET | Freq: Every day | ORAL | Status: DC
  Administered 2011-09-01 – 2011-09-04 (×4): 10 mg via ORAL
  Filled 2011-09-01 (×5): qty 1

## 2011-09-01 MED ORDER — VANCOMYCIN HCL IN DEXTROSE 1-5 GM/200ML-% IV SOLN
1000.0000 mg | Freq: Two times a day (BID) | INTRAVENOUS | Status: DC
Start: 2011-09-01 — End: 2011-09-01
  Filled 2011-09-01: qty 200

## 2011-09-01 MED ORDER — ACETAMINOPHEN 650 MG RE SUPP: 650.0000 mg | Freq: Four times a day (QID) | RECTAL | Status: DC | PRN

## 2011-09-01 MED ORDER — DESONIDE 0.05 % EX OINT
1.0000 "application " | TOPICAL_OINTMENT | Freq: Every day | CUTANEOUS | Status: DC
  Administered 2011-09-01 – 2011-09-04 (×2): 1 via TOPICAL
  Filled 2011-09-01: qty 15

## 2011-09-01 MED ORDER — PIPERACILLIN-TAZOBACTAM 3.375 G IVPB
3.3750 g | Freq: Three times a day (TID) | INTRAVENOUS | Status: DC
  Administered 2011-09-01 – 2011-09-02 (×3): 3.375 g via INTRAVENOUS
  Filled 2011-09-01 (×5): qty 50

## 2011-09-01 MED ORDER — ENOXAPARIN SODIUM 40 MG/0.4ML ~~LOC~~ SOLN
40.0000 mg | SUBCUTANEOUS | Status: DC
  Administered 2011-09-01 – 2011-09-02 (×2): 40 mg via SUBCUTANEOUS
  Filled 2011-09-01 (×3): qty 0.4

## 2011-09-01 MED ORDER — ONDANSETRON HCL 4 MG/2ML IJ SOLN: 4.0000 mg | Freq: Four times a day (QID) | INTRAMUSCULAR | Status: DC | PRN

## 2011-09-01 MED ORDER — SODIUM CHLORIDE 0.9 % IV SOLN
INTRAVENOUS | Status: DC
  Administered 2011-09-01 (×2): via INTRAVENOUS

## 2011-09-01 MED ORDER — BETAMETHASONE DIPROPIONATE 0.05 % EX OINT
1.0000 "application " | TOPICAL_OINTMENT | Freq: Every day | CUTANEOUS | Status: DC
  Administered 2011-09-01 – 2011-09-02 (×2): 1 via TOPICAL
  Filled 2011-09-01: qty 15
  Filled 2011-09-01 (×3): qty 45
  Filled 2011-09-01 (×3): qty 15
  Filled 2011-09-01 (×3): qty 45
  Filled 2011-09-01 (×3): qty 15
  Filled 2011-09-01: qty 45
  Filled 2011-09-01 (×2): qty 15

## 2011-09-01 MED ORDER — FLUTICASONE-SALMETEROL 100-50 MCG/DOSE IN AEPB
1.0000 | INHALATION_SPRAY | Freq: Two times a day (BID) | RESPIRATORY_TRACT | Status: DC
  Administered 2011-09-01 – 2011-09-05 (×8): 1 via RESPIRATORY_TRACT
  Filled 2011-09-01: qty 14

## 2011-09-01 NOTE — Care Management Note (Signed)
    Page 1 of 1   09/07/2011     10:59:25 AM   CARE MANAGEMENT NOTE 09/07/2011  Patient:  Gregory Phillips, Gregory Phillips   Account Number:  [id number]  Date Initiated:  09/01/2011  Documentation initiated by:  Letha Cape  Subjective/Objective Assessment:   dx cellulitis  of left leg  admit- lives with spouse.  pta independent.     Action/Plan:   Anticipated DC Date:  09/03/2011   Anticipated DC Plan:  HOME/SELF CARE      DC Planning Services  CM consult      Choice offered to / List presented to:             Status of service:  Completed, signed off Medicare Important Message given?   (If response is "NO", the following Medicare IM given date fields will be blank) Date Medicare IM given:   Date Additional Medicare IM given:    Discharge Disposition:  HOME/SELF CARE  Per UR Regulation:  Reviewed for med. necessity/level of care/duration of stay  If discussed at Long Length of Stay Meetings, dates discussed:    Comments:  09/03/11 13:51 Letha Cape RN, BSN [phone] conts with iv abx for 1-2 more days, possible dc over weekend.  NCM will continue to follow.  09/01/11 9:42 Letha Cape RN, BSN [phone] patient lives with spouse, pta independent.  Pateint has medication coverage and transportation.  NCM  will continue to follow for dc needs.

## 2011-09-01 NOTE — ED Provider Notes (Signed)
Medical screening examination/treatment/procedure(s) were conducted as a shared visit with non-physician practitioner(s) and myself.  I personally evaluated the patient during the encounter   Dione Booze, MD 09/01/11 [phone]

## 2011-09-01 NOTE — H&P (Signed)
Gregory Phillips is an 56 y.o. male.   PCP - Dr.William Alwyn Ren. Chief Complaint: Left leg swelling and erythema. HPI: 56 year old male with known history of asthma, eczema, hyperlipidemia and seminoma presented with complaints of fever chills and swelling and erythema of his left leg below his knee worsening extending up to his foot. The episode started last afternoon and slowly progress to his foot. Along with patient also experienced fever and chills. Patient also was recently treated for cellulitis of the same leg 2 months ago. At this time patient will be admitted for IV antibiotics for his cellulitis of his left leg. Denies any trauma or insect bites.  Past Medical History  Diagnosis Date  . Hyperlipidemia   . Asthma   . Eczema   . Cellulitis 2001     LLE ; [facility]  . Cellulitis 2007    R elbow ; S/ P drainage by Dr Priscille Kluver  . Edema     Past Surgical History  Procedure Date  . Seminoma  resection 2005     Dr Annabell Howells  . Arthroscopy of knee      X 2 Dr Thurston Hole; 2 nd surgery for patellar instability  . Refractive surgery 2004  . Colonoscopy w/ polypectomy 05/14/2008    Tubular adenoma; Dr. Sheryn Bison  . I&d extremity 2004    elbow    Family History  Problem Relation Age of Onset  . Cancer Father     liver  . Heart disease Father     dysrrhymthmia  . Stroke Mother     mini  . Asthma Sister   . Cancer Paternal Grandfather     stomach  . Eczema Maternal Uncle    Social History:  reports that he has never smoked. He does not have any smokeless tobacco history on file. He reports that he drinks alcohol. He reports that he does not use illicit drugs.  Allergies: No Known Allergies   (Not in a hospital admission)  Results for orders placed during the hospital encounter of 08/31/11 (from the past 48 hour(s))  CBC     Status: Abnormal   Collection Time   08/31/11 10:15 PM      Component Value Range Comment   WBC 23.6 (*) 4.0 - 10.5 (K/uL)    RBC 4.85   4.22 - 5.81 (MIL/uL)    Hemoglobin 14.7  13.0 - 17.0 (g/dL)    HCT 16.1  09.6 - 04.5 (%)    MCV 87.8  78.0 - 100.0 (fL)    MCH 30.3  26.0 - 34.0 (pg)    MCHC 34.5  30.0 - 36.0 (g/dL)    RDW 40.9  81.1 - 91.4 (%)    Platelets 226  150 - 400 (K/uL)   DIFFERENTIAL     Status: Abnormal   Collection Time   08/31/11 10:15 PM      Component Value Range Comment   Neutrophils Relative 94 (*) 43 - 77 (%)    Neutro Abs 22.2 (*) 1.7 - 7.7 (K/uL)    Lymphocytes Relative 3 (*) 12 - 46 (%)    Lymphs Abs 0.6 (*) 0.7 - 4.0 (K/uL)    Monocytes Relative 3  3 - 12 (%)    Monocytes Absolute 0.7  0.1 - 1.0 (K/uL)    Eosinophils Relative 0  0 - 5 (%)    Eosinophils Absolute 0.1  0.0 - 0.7 (K/uL)    Basophils Relative 0  0 - 1 (%)  Basophils Absolute 0.0  0.0 - 0.1 (K/uL)   POCT I-STAT, CHEM 8     Status: Abnormal   Collection Time   08/31/11 10:37 PM      Component Value Range Comment   Sodium 138  135 - 145 (mEq/L)    Potassium 3.7  3.5 - 5.1 (mEq/L)    Chloride 101  96 - 112 (mEq/L)    BUN 12  6 - 23 (mg/dL)    Creatinine, Ser 1.61  0.50 - 1.35 (mg/dL)    Glucose, Bld 096 (*) 70 - 99 (mg/dL)    Calcium, Ion 0.45 (*) 1.12 - 1.32 (mmol/L)    TCO2 25  0 - 100 (mmol/L)    Hemoglobin 16.0  13.0 - 17.0 (g/dL)    HCT 40.9  81.1 - 91.4 (%)    No results found.  Review of Systems  Constitutional: Positive for fever and chills.  HENT: Negative.   Eyes: Negative.   Respiratory: Negative.   Cardiovascular: Negative.   Gastrointestinal: Negative.   Genitourinary: Negative.   Musculoskeletal:       Pain and swelling of left leg.  Skin: Negative.   Neurological: Negative.   Endo/Heme/Allergies: Negative.   Psychiatric/Behavioral: Negative.     Blood pressure 122/76, pulse 101, temperature 98.1 F (36.7 C), temperature source Oral, resp. rate 18, SpO2 98.00%. Physical Exam  Constitutional: He is oriented to person, place, and time. He appears well-developed and well-nourished. No distress.    HENT:  Head: Normocephalic and atraumatic.  Left Ear: External ear normal.  Mouth/Throat: No oropharyngeal exudate.  Eyes: Conjunctivae are normal. Pupils are equal, round, and reactive to light. Right eye exhibits no discharge. Left eye exhibits no discharge. No scleral icterus.  Neck: Normal range of motion. Neck supple.  Cardiovascular: Normal rate and regular rhythm.   Respiratory: Effort normal and breath sounds normal. No respiratory distress. He has no wheezes. He has no rales.  GI: Soft. Bowel sounds are normal. He exhibits no distension. There is no tenderness. There is no rebound.  Musculoskeletal:       Swelling and erythema of the left leg knee downwards.  Neurological: He is alert and oriented to person, place, and time.  Skin: He is not diaphoretic. There is erythema (left leg.).  Psychiatric: His behavior is normal.     Assessment/Plan #1. Cellulitis of the left leg - place patient on vancomycin and Zosyn. Check blood cultures. Keep leg elevated. Patient has no significant pain on moving his legs and foot. #2. Asthma - stable continue home medications. #3. History of eczema - continue present medications.  CODE STATUS - full code.  [name] N. 09/01/2011, 12:54 AM

## 2011-09-01 NOTE — Progress Notes (Signed)
ANTIBIOTIC CONSULT NOTE - INITIAL  Pharmacy Consult for Vancocin/Zosyn Indication: cellulitis  No Known Allergies  Patient Measurements: Height: 5\' 6"  (167.6 cm) Weight: 162 lb 0.6 oz (73.5 kg) (Copied from 07/06/11 records) IBW/kg (Calculated) : 63.8   Vital Signs: Temp: 98.1 F (36.7 C) (06/10 2340) Temp src: Oral (06/10 2340) BP: 129/77 mmHg (06/11 0135) Pulse Rate: 99  (06/11 0135)  Labs:  Alvira Philips 08/31/11 2237 08/31/11 2215  WBC -- 23.6*  HGB 16.0 14.7  PLT -- 226  LABCREA -- --  CREATININE 1.00 --   Estimated Creatinine Clearance: 77.1 ml/min (by C-G formula based on Cr of 1).  Microbiology: No results found for this or any previous visit (from the past 720 hour(s)).  Medical History: Past Medical History  Diagnosis Date  . Hyperlipidemia   . Asthma   . Eczema   . Cellulitis 2001     LLE ; [facility]  . Cellulitis 2007    R elbow ; S/ P drainage by Dr Priscille Kluver  . Edema     Medications:  Prescriptions prior to admission  Medication Sig Dispense Refill  . betamethasone dipropionate (DIPROLENE) 0.05 % ointment Apply 1 application topically at bedtime.       Marland Kitchen desonide (DESOWEN) 0.05 % ointment Apply 1 application topically at bedtime.       . Fluticasone-Salmeterol (ADVAIR) 100-50 MCG/DOSE AEPB Inhale 1 puff into the lungs 2 (two) times daily.      . mometasone (NASONEX) 50 MCG/ACT nasal spray Place 2 sprays into the nose daily.      . montelukast (SINGULAIR) 10 MG tablet Take 10 mg by mouth at bedtime.      . pravastatin (PRAVACHOL) 20 MG tablet Take 20 mg by mouth at bedtime.       . sildenafil (VIAGRA) 100 MG tablet Take 100 mg by mouth as needed. As needed for erectile dysfunction.      Marland Kitchen zolpidem (AMBIEN) 5 MG tablet Take 5 mg by mouth at bedtime as needed. For sleep       Scheduled:    . ampicillin-sulbactam (UNASYN) IV  3 g Intravenous Once  . betamethasone dipropionate  1 application Topical QHS  . desonide  1 application Topical QHS    . enoxaparin  40 mg Subcutaneous Q24H  . fluticasone  1 spray Each Nare Daily  . Fluticasone-Salmeterol  1 puff Inhalation BID  . montelukast  10 mg Oral QHS  . piperacillin-tazobactam  3.375 g Intravenous Once  . piperacillin-tazobactam (ZOSYN)  IV  3.375 g Intravenous Q8H  . simvastatin  10 mg Oral q1800  . sodium chloride  1,000 mL Intravenous Once  . vancomycin  750 mg Intravenous Once  . vancomycin  750 mg Intravenous Q12H  . DISCONTD: vancomycin  1,000 mg Intravenous Once  . DISCONTD: vancomycin  1,000 mg Intravenous Q12H    Assessment: 55yo male c/o fever, chills, and swelling/erythema of LLE extending from below knee to foot, had been tx'd for cellulitis two months ago, to begin IV ABX for recurrent cellulitis.  Goal of Therapy:  Vancomycin trough level 10-15 mcg/ml  Plan:  Will begin vancomycin 750mg  IV Q12H and Zosyn 3.375g IV Q8H and monitor CBC, Cx, levels prn.  Colleen Can PharmD BCPS 09/01/2011,2:21 AM

## 2011-09-01 NOTE — ED Notes (Signed)
Dr. Toniann Fail was in to assess pt for admission.  Redness and swelling marked on left leg with skin marker.  Pt and wife updated on plan of care.

## 2011-09-01 NOTE — Progress Notes (Addendum)
PATIENT DETAILS Name: Gregory Phillips Age: 56 y.o. Sex: male Date of Birth: 08-09-1955 Admit Date: 08/31/2011 [id number] Alwyn Ren, MD, MD  Subjective: Left essentially unchanged from last night.  Objective: Vital signs in last 24 hours: Filed Vitals:   09/01/11 0135 09/01/11 0237 09/01/11 0615 09/01/11 0745  BP: 129/77 117/71 118/74   Pulse: 99 100 102   Temp:  100.4 F (38 C) 101.3 F (38.5 C) 99.3 F (37.4 C)  TempSrc:  Oral Oral Oral  Resp: 18 18 20    Height: 5\' 6"  (1.676 m) 5\' 5"  (1.651 m)    Weight: 73.5 kg (162 lb 0.6 oz) 72.7 kg (160 lb 4.4 oz)    SpO2: 97% 95% 96%     Weight change:   Body mass index is 26.67 kg/(m^2).  Intake/Output from previous day:  Intake/Output Summary (Last 24 hours) at 09/01/11 1103 Last data filed at 09/01/11 0947  Gross per 24 hour  Intake 637.92 ml  Output      0 ml  Net 637.92 ml    PHYSICAL EXAM: Gen Exam: Awake and alert with clear speech.    Neck: Supple, No JVD.   Chest: B/L Clear.   CVS: S1 S2 Regular, no murmurs.  Abdomen: soft, BS +, non tender, non distended.  Extremities: no edema on right, Left lower extremity -erythematous all the way from the dorsum of foot to the knee area-well within the marked area. Slightly swollen and non-tender. Sensation is intact-leg is not tense at all. Neurologic: Non Focal.   Skin: No Rash.   Wounds: N/A.    CONSULTS:  None  LAB RESULTS: CBC  Lab 09/01/11 0256 08/31/11 2237 08/31/11 2215  WBC 21.1* -- 23.6*  HGB 14.1 16.0 14.7  HCT 42.1 47.0 42.6  PLT 211 -- 226  MCV 88.8 -- 87.8  MCH 29.7 -- 30.3  MCHC 33.5 -- 34.5  RDW 13.8 -- 13.6  LYMPHSABS -- -- 0.6*  MONOABS -- -- 0.7  EOSABS -- -- 0.1  BASOSABS -- -- 0.0  BANDABS -- -- --    Chemistries   Lab 09/01/11 0256 08/31/11 2237  NA 138 138  K 3.5 3.7  CL 102 101  CO2 25 --  GLUCOSE 110* 110*  BUN 11 12  CREATININE 0.79 1.00  CALCIUM 8.6 --  MG -- --    CBG: No results found for this basename: GLUCAP:5  in the last 168 hours  GFR Estimated Creatinine Clearance: 92.9 ml/min (by C-G formula based on Cr of 0.79).  Coagulation profile No results found for this basename: INR:5,PROTIME:5 in the last 168 hours  Cardiac Enzymes No results found for this basename: CK:3,CKMB:3,TROPONINI:3,MYOGLOBIN:3 in the last 168 hours  No components found with this basename: POCBNP:3 No results found for this basename: DDIMER:2 in the last 72 hours No results found for this basename: HGBA1C:2 in the last 72 hours No results found for this basename: CHOL:2,HDL:2,LDLCALC:2,TRIG:2,CHOLHDL:2,LDLDIRECT:2 in the last 72 hours No results found for this basename: TSH,T4TOTAL,FREET3,T3FREE,THYROIDAB in the last 72 hours No results found for this basename: VITAMINB12:2,FOLATE:2,FERRITIN:2,TIBC:2,IRON:2,RETICCTPCT:2 in the last 72 hours No results found for this basename: LIPASE:2,AMYLASE:2 in the last 72 hours  Urine Studies No results found for this basename: UACOL:2,UAPR:2,USPG:2,UPH:2,UTP:2,UGL:2,UKET:2,UBIL:2,UHGB:2,UNIT:2,UROB:2,ULEU:2,UEPI:2,UWBC:2,URBC:2,UBAC:2,CAST:2,CRYS:2,UCOM:2,BILUA:2 in the last 72 hours  MICROBIOLOGY: No results found for this or any previous visit (from the past 240 hour(s)).  RADIOLOGY STUDIES/RESULTS: No results found.  MEDICATIONS: Scheduled Meds:   . ampicillin-sulbactam (UNASYN) IV  3 g Intravenous Once  . betamethasone dipropionate  1  application Topical QHS  . desonide  1 application Topical QHS  . enoxaparin  40 mg Subcutaneous Q24H  . fluticasone  1 spray Each Nare Daily  . Fluticasone-Salmeterol  1 puff Inhalation BID  . montelukast  10 mg Oral QHS  . piperacillin-tazobactam  3.375 g Intravenous Once  . piperacillin-tazobactam (ZOSYN)  IV  3.375 g Intravenous Q8H  . simvastatin  10 mg Oral q1800  . sodium chloride  1,000 mL Intravenous Once  . vancomycin  750 mg Intravenous Once  . vancomycin  750 mg Intravenous Q12H  . DISCONTD: vancomycin  1,000 mg Intravenous  Once  . DISCONTD: vancomycin  1,000 mg Intravenous Q12H   Continuous Infusions:   . sodium chloride 125 mL/hr at 09/01/11 0634   PRN Meds:.acetaminophen, acetaminophen, ondansetron (ZOFRAN) IV, ondansetron, zolpidem  Antibiotics: Anti-infectives     Start     Dose/Rate Route Frequency Ordered Stop   09/01/11 1200   vancomycin (VANCOCIN) IVPB 1000 mg/200 mL premix  Status:  Discontinued        1,000 mg 200 mL/hr over 60 Minutes Intravenous Every 12 hours 09/01/11 0220 09/01/11 0221   09/01/11 1200   vancomycin (VANCOCIN) 750 mg in sodium chloride 0.9 % 150 mL IVPB        750 mg 150 mL/hr over 60 Minutes Intravenous Every 12 hours 09/01/11 0221     09/01/11 0800  piperacillin-tazobactam (ZOSYN) IVPB 3.375 g       3.375 g 12.5 mL/hr over 240 Minutes Intravenous Every 8 hours 09/01/11 0220     09/01/11 0230   vancomycin (VANCOCIN) IVPB 1000 mg/200 mL premix  Status:  Discontinued        1,000 mg 200 mL/hr over 60 Minutes Intravenous  Once 09/01/11 0220 09/01/11 0221   09/01/11 0230  piperacillin-tazobactam (ZOSYN) IVPB 3.375 g       3.375 g 100 mL/hr over 30 Minutes Intravenous  Once 09/01/11 0220 09/01/11 0459   09/01/11 0230   vancomycin (VANCOCIN) 750 mg in sodium chloride 0.9 % 150 mL IVPB        750 mg 150 mL/hr over 60 Minutes Intravenous  Once 09/01/11 0221 09/01/11 0426   08/31/11 2215   Ampicillin-Sulbactam (UNASYN) 3 g in sodium chloride 0.9 % 100 mL IVPB        3 g 100 mL/hr over 60 Minutes Intravenous  Once 08/31/11 2214 08/31/11 2323          Assessment/Plan: Principal Problem:  *Cellulitis of left leg -continue with Vanco Zosyn-day 1 -keep left leg elevated -still febrile this am, but non toxic appearance and now with down-trending leukocytosis -check doppler-to make sure no DVT -re-assess in am-for need to continue Zosyn  SIR's -2/2 to above -continue with antibiotics  Dyslipidemia -continue with Simvastatin  Eczema -continue with Desonide and  Diprolene ointments  Asthma -lungs clear -continue with Advair and Singulair  H/O Erectile Dysfunction -resume as needed Sildenafil when discharged  Disposition: -remain inpatient  DVT Prophylaxis: -prophylactic Lovenox  Code Status: -Full Code  Jeoffrey Massed, MD  [facility] Pager [phone]  If 7PM-7AM, please contact night-coverage [email or web address] Password [email or web address] 09/01/2011, 11:03 AM   LOS: 1 day

## 2011-09-01 NOTE — Progress Notes (Signed)
Pt had an elevated temperature of 101.3. Pt was given 650 mg of tylenol. RN will continue to monitor pt and remind day shift nurse to reassess temperature at given time.

## 2011-09-02 DIAGNOSIS — L2089 Other atopic dermatitis: Secondary | ICD-10-CM

## 2011-09-02 DIAGNOSIS — J45909 Unspecified asthma, uncomplicated: Secondary | ICD-10-CM

## 2011-09-02 DIAGNOSIS — M7989 Other specified soft tissue disorders: Secondary | ICD-10-CM

## 2011-09-02 DIAGNOSIS — L03119 Cellulitis of unspecified part of limb: Secondary | ICD-10-CM

## 2011-09-02 DIAGNOSIS — L02419 Cutaneous abscess of limb, unspecified: Secondary | ICD-10-CM

## 2011-09-02 LAB — CBC
Hemoglobin: 13.1 g/dL (ref 13.0–17.0)
MCH: 29.5 pg (ref 26.0–34.0)
MCHC: 32.9 g/dL (ref 30.0–36.0)
RDW: 13.7 % (ref 11.5–15.5)

## 2011-09-02 LAB — DIFFERENTIAL
Basophils Relative: 0 % (ref 0–1)
Eosinophils Absolute: 0.7 10*3/uL (ref 0.0–0.7)
Monocytes Relative: 6 % (ref 3–12)
Neutrophils Relative %: 82 % — ABNORMAL HIGH (ref 43–77)

## 2011-09-02 LAB — BASIC METABOLIC PANEL
BUN: 9 mg/dL (ref 6–23)
Creatinine, Ser: 0.88 mg/dL (ref 0.50–1.35)
GFR calc Af Amer: >90 mL/min (ref >90)
GFR calc non Af Amer: >90 mL/min (ref >90)
Potassium: 3.9 mEq/L (ref 3.5–5.1)

## 2011-09-02 MED ORDER — VANCOMYCIN HCL 1000 MG IV SOLR
750.0000 mg | Freq: Two times a day (BID) | INTRAVENOUS | Status: DC
  Administered 2011-09-02 – 2011-09-04 (×4): 750 mg via INTRAVENOUS
  Filled 2011-09-02 (×6): qty 750

## 2011-09-02 MED ORDER — SODIUM CHLORIDE 0.9 % IV SOLN
1.5000 g | Freq: Four times a day (QID) | INTRAVENOUS | Status: DC
  Administered 2011-09-02 – 2011-09-03 (×3): 1.5 g via INTRAVENOUS
  Filled 2011-09-02 (×7): qty 1.5

## 2011-09-02 MED ORDER — SULFAMETHOXAZOLE-TMP DS 800-160 MG PO TABS
1.0000 | ORAL_TABLET | Freq: Two times a day (BID) | ORAL | Status: DC
  Administered 2011-09-02: 1 via ORAL
  Filled 2011-09-02 (×4): qty 1

## 2011-09-02 MED FILL — Betamethasone Dipropionate Augmented Oint 0.05%: CUTANEOUS | Qty: 45 | Status: AC

## 2011-09-02 NOTE — Progress Notes (Signed)
[facility] PROGRESS NOTE  Gregory Phillips ZOX:[id number] DOB: 1955-07-09 DOA: 08/31/2011   Assessment/Plan: Patient Active Hospital Problem List: Cellulitis of left leg (09/01/2011) -patient erythema is significantly decreased, he still has swelling of the left lower extremity. Doppler of the lower extremities are pending at this time. Blood cultures continued to be negative. He has remained afebrile and white count is trending down. He is nontoxic-appearing. -He has been on vancomycin and Zosyn for 24 hours. Due to his improvement. I will change him to Bactrim by mouth and monitor him overnight. He's also been advised to elevate his legs.  Code Status: Full code Family Communication: Spouse [phone] Disposition Plan: home  Lambert Keto, MD  [facility] Pager [phone]  If 7PM-7AM, please contact night-coverage [email or web address] Password [email or web address] 09/02/2011, 11:15 AM   LOS: 2 days   Procedures:  Doppler 09/02/2011  Antibiotics:  Vancomycin 09/01/2011>>> 09/02/2011  June 09/01/2011>>> 09/02/2011  Bactrim 09/02/2011  Interim History:   Subjective: No pain, erythema decreased. Patient tolerating diet  Objective: Filed Vitals:   09/01/11 1941 09/01/11 2132 09/02/11 0519 09/02/11 0754  BP:  125/79 113/71   Pulse:  93 77   Temp: 99.3 F (37.4 C) 99.1 F (37.3 C) 98.1 F (36.7 C)   TempSrc: Oral Oral Oral   Resp:  16 20   Height:      Weight:      SpO2:  98% 97% 97%    Intake/Output Summary (Last 24 hours) at 09/02/11 1115 Last data filed at 09/02/11 0923  Gross per 24 hour  Intake 3300.17 ml  Output      0 ml  Net 3300.17 ml   Weight change:   Exam:  General: Alert, awake, oriented x3, in no acute distress.  HEENT: No bruits, no goiter.  Heart: Regular rate and rhythm, without murmurs, rubs, gallops.  Lungs: Good air movement, bilateral air movement.  Abdomen: Soft, nontender, nondistended, positive bowel sounds.  Neuro:  Grossly intact, nonfocal. Extremities: There is marking around his knee at shows significant decrease in his erythema. He has significant swelling of his lower extremity no point of entry for his cellulitis.   Data Reviewed: Basic Metabolic Panel:  Lab 09/02/11 6578 09/01/11 0256 08/31/11 2237  NA 138 138 138  K 3.9 3.5 --  CL 103 102 101  CO2 25 25 --  GLUCOSE 98 110* 110*  BUN 9 11 12   CREATININE 0.88 0.79 1.00  CALCIUM 8.6 8.6 --  MG -- -- --  PHOS -- -- --   Liver Function Tests:  Lab 09/01/11 0256  AST 16  ALT 16  ALKPHOS 52  BILITOT 1.3*  PROT 6.7  ALBUMIN 3.3*   No results found for this basename: LIPASE:5,AMYLASE:5 in the last 168 hours No results found for this basename: AMMONIA:5 in the last 168 hours CBC:  Lab 09/02/11 0500 09/01/11 0256 08/31/11 2237 08/31/11 2215  WBC 13.8* 21.1* -- 23.6*  NEUTROABS 11.3* -- -- 22.2*  HGB 13.1 14.1 16.0 14.7  HCT 39.8 42.1 47.0 42.6  MCV 89.6 88.8 -- 87.8  PLT 195 211 -- 226   Cardiac Enzymes: No results found for this basename: CKTOTAL:5,CKMB:5,CKMBINDEX:5,TROPONINI:5 in the last 168 hours BNP: No components found with this basename: POCBNP:5 CBG: No results found for this basename: GLUCAP:5 in the last 168 hours  Recent Results (from the past 240 hour(s))  CULTURE, BLOOD (ROUTINE X 2)     Status: Normal (Preliminary result)   Collection Time   09/01/11  2:35 AM      Component Value Range Status Comment   Specimen Description BLOOD LEFT ARM   Final    Special Requests BOTTLES DRAWN AEROBIC AND ANAEROBIC [facility] EACH   Final    Culture  Setup Time [id number]   Final    Culture     Final    Value:        BLOOD CULTURE RECEIVED NO GROWTH TO DATE CULTURE WILL BE HELD FOR 5 DAYS BEFORE ISSUING A FINAL NEGATIVE REPORT   Report Status PENDING   Incomplete   CULTURE, BLOOD (ROUTINE X 2)     Status: Normal (Preliminary result)   Collection Time   09/01/11  2:50 AM      Component Value Range Status Comment   Specimen  Description BLOOD RIGHT HAND   Final    Special Requests BOTTLES DRAWN AEROBIC AND ANAEROBIC [facility]   Final    Culture  Setup Time [id number]   Final    Culture     Final    Value:        BLOOD CULTURE RECEIVED NO GROWTH TO DATE CULTURE WILL BE HELD FOR 5 DAYS BEFORE ISSUING A FINAL NEGATIVE REPORT   Report Status PENDING   Incomplete      Studies: No results found.  Scheduled Meds:   . betamethasone dipropionate  1 application Topical QHS  . desonide  1 application Topical QHS  . enoxaparin  40 mg Subcutaneous Q24H  . fluticasone  1 spray Each Nare Daily  . Fluticasone-Salmeterol  1 puff Inhalation BID  . montelukast  10 mg Oral QHS  . simvastatin  10 mg Oral q1800  . sulfamethoxazole-trimethoprim  1 tablet Oral Q12H  . DISCONTD: piperacillin-tazobactam (ZOSYN)  IV  3.375 g Intravenous Q8H  . DISCONTD: vancomycin  750 mg Intravenous Q12H   Continuous Infusions:   . sodium chloride 75 mL/hr at 09/01/11 1941

## 2011-09-02 NOTE — Progress Notes (Signed)
-  restart vancomycin. -erythema getting worst.

## 2011-09-02 NOTE — Progress Notes (Signed)
*  Preliminary Results* Left lower extremity venous duplex completed. Left lower extremity is negative for deep vein thrombosis.  09/02/2011 3:54 PM  Elpidio Galea, RDMS,RDCS

## 2011-09-03 ENCOUNTER — Telehealth: Payer: Self-pay | Admitting: Internal Medicine

## 2011-09-03 DIAGNOSIS — J45909 Unspecified asthma, uncomplicated: Secondary | ICD-10-CM

## 2011-09-03 DIAGNOSIS — L2089 Other atopic dermatitis: Secondary | ICD-10-CM

## 2011-09-03 DIAGNOSIS — L03119 Cellulitis of unspecified part of limb: Secondary | ICD-10-CM

## 2011-09-03 DIAGNOSIS — L02419 Cutaneous abscess of limb, unspecified: Secondary | ICD-10-CM

## 2011-09-03 MED ORDER — SODIUM CHLORIDE 0.9 % IV SOLN
3.0000 g | Freq: Four times a day (QID) | INTRAVENOUS | Status: DC
  Administered 2011-09-03 – 2011-09-04 (×4): 3 g via INTRAVENOUS
  Filled 2011-09-03 (×7): qty 3

## 2011-09-03 MED ORDER — BETAMETHASONE DIPROPIONATE AUG 0.05 % EX OINT
TOPICAL_OINTMENT | Freq: Every day | CUTANEOUS | Status: DC
  Administered 2011-09-04: 23:00:00 via TOPICAL

## 2011-09-03 NOTE — Progress Notes (Signed)
[facility] PROGRESS NOTE  Gregory Phillips:[id number] DOB: [birth year] DOA: 08/31/2011   Assessment/Plan: Patient Active Hospital Problem List: Cellulitis of left leg (09/01/2011) -bactrim d/c as leg worsen in less than 12 hrs. Changed back to unasyn and vanc. -patient erythema is significantly decreased,after starting unasyn and vanc.  Code Status: Full code Family Communication: Spouse [phone] Disposition Plan: home  Lambert Keto, MD  [facility] Pager [phone]  If 7PM-7AM, please contact night-coverage [email or web address] Password [email or web address] 09/03/2011, 9:48 AM   LOS: 3 days   Procedures:  Doppler 09/02/2011  Antibiotics:  Vancomycin 09/01/2011>>> 09/02/2011  June 09/01/2011>>> 09/02/2011  Bactrim 09/02/2011>> 09/02/2011  Interim History:   Subjective: No pain, erythema decreased. Patient tolerating diet  Objective: Filed Vitals:   09/02/11 2011 09/02/11 2114 09/03/11 0444 09/03/11 0729  BP:  115/71 118/74   Pulse:  82 83   Temp:  98.2 F (36.8 C) 99.1 F (37.3 C)   TempSrc:  Oral Oral   Resp:  18 18   Height:      Weight:      SpO2: 97% 97% 97% 94%    Intake/Output Summary (Last 24 hours) at 09/03/11 0948 Last data filed at 09/03/11 0900  Gross per 24 hour  Intake   2743 ml  Output      0 ml  Net   2743 ml   Weight change:   Exam:  General: Alert, awake, oriented x3, in no acute distress.  HEENT: No bruits, no goiter.  Heart: Regular rate and rhythm, without murmurs, rubs, gallops.  Lungs: Good air movement, bilateral air movement.  Abdomen: Soft, nontender, nondistended, positive bowel sounds.  Neuro: Grossly intact, nonfocal. Extremities: There is marking around his knee at shows significant decrease in his erythema. He has significant swelling of his lower extremity no point of entry for his cellulitis.   Data Reviewed: Basic Metabolic Panel:  Lab 09/02/11 6578 09/01/11 0256 08/31/11 2237  NA 138 138 138  K  3.9 3.5 --  CL 103 102 101  CO2 25 25 --  GLUCOSE 98 110* 110*  BUN 9 11 12   CREATININE 0.88 0.79 1.00  CALCIUM 8.6 8.6 --  MG -- -- --  PHOS -- -- --   Liver Function Tests:  Lab 09/01/11 0256  AST 16  ALT 16  ALKPHOS 52  BILITOT 1.3*  PROT 6.7  ALBUMIN 3.3*   No results found for this basename: LIPASE:5,AMYLASE:5 in the last 168 hours No results found for this basename: AMMONIA:5 in the last 168 hours CBC:  Lab 09/02/11 0500 09/01/11 0256 08/31/11 2237 08/31/11 2215  WBC 13.8* 21.1* -- 23.6*  NEUTROABS 11.3* -- -- 22.2*  HGB 13.1 14.1 16.0 14.7  HCT 39.8 42.1 47.0 42.6  MCV 89.6 88.8 -- 87.8  PLT 195 211 -- 226   Cardiac Enzymes: No results found for this basename: CKTOTAL:5,CKMB:5,CKMBINDEX:5,TROPONINI:5 in the last 168 hours BNP: No components found with this basename: POCBNP:5 CBG: No results found for this basename: GLUCAP:5 in the last 168 hours  Recent Results (from the past 240 hour(s))  CULTURE, BLOOD (ROUTINE X 2)     Status: Normal (Preliminary result)   Collection Time   09/01/11  2:35 AM      Component Value Range Status Comment   Specimen Description BLOOD LEFT ARM   Final    Special Requests BOTTLES DRAWN AEROBIC AND ANAEROBIC [facility]   Final    Culture  Setup Time [id number]   Final  Culture     Final    Value:        BLOOD CULTURE RECEIVED NO GROWTH TO DATE CULTURE WILL BE HELD FOR 5 DAYS BEFORE ISSUING A FINAL NEGATIVE REPORT   Report Status PENDING   Incomplete   CULTURE, BLOOD (ROUTINE X 2)     Status: Normal (Preliminary result)   Collection Time   09/01/11  2:50 AM      Component Value Range Status Comment   Specimen Description BLOOD RIGHT HAND   Final    Special Requests BOTTLES DRAWN AEROBIC AND ANAEROBIC [facility]   Final    Culture  Setup Time [id number]   Final    Culture     Final    Value:        BLOOD CULTURE RECEIVED NO GROWTH TO DATE CULTURE WILL BE HELD FOR 5 DAYS BEFORE ISSUING A FINAL NEGATIVE REPORT   Report Status  PENDING   Incomplete      Studies: No results found.  Scheduled Meds:    . ampicillin-sulbactam (UNASYN) IV  3 g Intravenous Q6H  . betamethasone dipropionate  1 application Topical QHS  . desonide  1 application Topical QHS  . fluticasone  1 spray Each Nare Daily  . Fluticasone-Salmeterol  1 puff Inhalation BID  . montelukast  10 mg Oral QHS  . simvastatin  10 mg Oral q1800  . vancomycin  750 mg Intravenous Q12H  . DISCONTD: ampicillin-sulbactam (UNASYN) IV  1.5 g Intravenous Q6H  . DISCONTD: enoxaparin  40 mg Subcutaneous Q24H  . DISCONTD: sulfamethoxazole-trimethoprim  1 tablet Oral Q12H   Continuous Infusions:    . sodium chloride 75 mL/hr at 09/03/11 [phone]

## 2011-09-03 NOTE — Telephone Encounter (Signed)
Pts wife states she would like Dr. Caryl Never opinion on the medication the hospital has put the pt on. He has been admitted for cellulitis from knee down on his left leg.

## 2011-09-03 NOTE — Telephone Encounter (Signed)
Per pt's wife, they have given him unasyn and zosyn IV. Pt is currently getting Vancomycin IV. Cultures were still pending yesterday. I advised her I would forward the message to Dr Alwyn Ren but he is relying on the Hospital physicians for management while pt is in the hospital and likely will not make further recommendations while pt is in the hospital. Pt's wife still wants to know Dr Frederik Pear thoughts. Please advise.

## 2011-09-03 NOTE — Telephone Encounter (Signed)
Discuss with patient wife 

## 2011-09-03 NOTE — Telephone Encounter (Signed)
Because this has been a recurrent problem and has required rehospitalization; please verify with the hospitalist whether Infectious Disease has been consulted. This would be my request.

## 2011-09-04 DIAGNOSIS — L2089 Other atopic dermatitis: Secondary | ICD-10-CM

## 2011-09-04 DIAGNOSIS — L03119 Cellulitis of unspecified part of limb: Secondary | ICD-10-CM

## 2011-09-04 DIAGNOSIS — L02419 Cutaneous abscess of limb, unspecified: Secondary | ICD-10-CM

## 2011-09-04 DIAGNOSIS — J45909 Unspecified asthma, uncomplicated: Secondary | ICD-10-CM

## 2011-09-04 MED ORDER — CEFAZOLIN SODIUM-DEXTROSE 2-3 GM-% IV SOLR
2.0000 g | Freq: Three times a day (TID) | INTRAVENOUS | Status: DC
  Administered 2011-09-04 – 2011-09-05 (×3): 2 g via INTRAVENOUS
  Filled 2011-09-04 (×4): qty 50

## 2011-09-04 NOTE — Consult Note (Addendum)
Infectious Diseases Initial Consultation  Reason for Consultation:  Left leg cellulitis   HPI: Gregory Phillips is a 56 y.o. male with history of seminoma s/p Right radical orchiectomy and inguinal hernia repair in 2005, hx of eczema, and chronic unilateraly left leg lymphedema dating back to mid 1980s who recently had an episode of left leg cellulitis in April 2013. He returns to ED with acute onset of fevers, rigors, n/v and abrupt onset of erythema to his left leg lower extremity from toes to below knee. He denies trauma, any animal scratches, plant/shrub scratches to his leg, nor insect bite. He states that his systemic symptoms occurred initially with lymphadenopathy noted to his groin followed by increasing erythema and swelling to his left leg. He presented to ED within 6-12 hrs of symptom onset,  he was found to be febrile 101.3, mild tachycardia in 100s, no hypotension, but leukocytosis of 23.6 with left shift, 94%.   He states this is similar to when he had cellulitis back in 2001 where he was hospitalized for a week( treated with levoflox-nafcillin-then discharged on cephalexin). He reports having a staph infection skin infection at one time, but unclear if it was in 2001. For his current symptoms, he states that started to have gradually improved over the last 2-3 days with IV antibiotics, however, when he was switched to bactrim, the redness in his leg emerged quickly. He is now back to vancomycin and amp/sub (day #4). He has been afebrile ever since initial fever on day of admit. His erythema is nearly back to normal but swelling in his ankle still remains. His WBC has trended down to 13.8, but not back at his basline  He states that episode of cellulitis in April 2013 started abruptly but he did not have N/V/fevers, nor elevated WBC by our records. He states that he did have to come to ED/Short stay unit since oral abtx did not work in the first few days, then received a day of Amp/Sub,  and subsequently discharged on cephalexin.  He has been healthy otherwise. He only uses topical cream for eczema. Did not receive flu shot last year. No history of diabetes.never known to have claudication. Or pain with ambulation. Wears compression stalking for lymphadema. He has had lymphadema roughly 53yr likely sequelae of injury of lymph system from testicular CA surgery, although he had right orchiectomy.  abtx day #4 unasyn 6/10, 6/12 to present piptazo 6/11-6/12 vanco 6/11- present  Past Medical History  Diagnosis Date  . Hyperlipidemia   . Asthma   . Eczema   . Cellulitis 2001     LLE ; [facility]  . Cellulitis 2007    R elbow ; S/ P drainage by Dr Priscille Kluver  . Edema   . Cancer     hx of testicular  ca    Allergies: No Known Allergies  MEDICATIONS:    . augmented betamethasone dipropionate   Topical QHS  . desonide  1 application Topical QHS  . fluticasone  1 spray Each Nare Daily  . Fluticasone-Salmeterol  1 puff Inhalation BID  . montelukast  10 mg Oral QHS  . simvastatin  10 mg Oral q1800  . vancomycin  750 mg Intravenous Q12H  . DISCONTD: ampicillin-sulbactam (UNASYN) IV  3 g Intravenous Q6H    History  Substance Use Topics  . Smoking status: Never Smoker   . Smokeless tobacco: Never Used  . Alcohol Use: 0.0 oz/week     occasional  Family History  Problem Relation Age of Onset  . Cancer Father     liver  . Heart disease Father     dysrrhymthmia  . Stroke Mother     mini  . Asthma Sister   . Cancer Paternal Grandfather     stomach  . Eczema Maternal Uncle     Review of Systems (at admit) Constitutional: positive for fever, chills, diaphoresis,  But no activity change, appetite change, fatigue and unexpected weight change.  HENT: Negative for congestion, sore throat, rhinorrhea, sneezing, trouble swallowing and sinus pressure.  Eyes: Negative for photophobia and visual disturbance.  Respiratory: Negative for cough, chest tightness,  shortness of breath, wheezing and stridor.  Cardiovascular: Negative for chest pain, palpitations and leg swelling.  Gastrointestinal: positve for nausea, vomiting, abdominal pain,but no diarrhea, constipation, blood in stool, abdominal distention and anal bleeding.  Genitourinary: Negative for dysuria, hematuria, flank pain and difficulty urinating.  Musculoskeletal: positive for left leg swelling. Negative for myalgias, back pain, joint swelling, arthralgias and gait problem.  Skin: positive for rash per hpi. negative for color change, pallor, rash and wound.  Neurological: Negative for dizziness, tremors, weakness and light-headedness.  Hematological: Negative for adenopathy. Does not bruise/bleed easily.  Psychiatric/Behavioral: Negative for behavioral problems, confusion, sleep disturbance, dysphoric mood, decreased concentration and agitation.    OBJECTIVE: Temp:  [97.8 F (36.6 C)-98 F (36.7 C)] 97.8 F (36.6 C) (06/14 1412) Pulse Rate:  [68-72] 72  (06/14 1412) Resp:  [18-20] 18  (06/14 1412) BP: (114-124)/(74-81) 124/77 mmHg (06/14 1412) SpO2:  [96 %-98 %] 98 % (06/14 1412) BP 124/77  Pulse 72  Temp 97.8 F (36.6 C) (Oral)  Resp 18  Ht 5\' 5"  (1.651 m)  Wt 160 lb 4.4 oz (72.7 kg)  BMI 26.67 kg/m2  SpO2 98%  General Appearance:    Alert, cooperative, no distress, appears stated age  Head:    Normocephalic, without obvious abnormality, atraumatic  Eyes:    PERRL, conjunctiva/corneas clear, EOM's intact,  Ears:    Normal TM's and external ear canals, both ears  Nose:   Nares normal, septum midline, mucosa normal, no drainage   or sinus tenderness  Throat:   Lips, mucosa, and tongue normal; teeth and gums normal  Neck:   Supple, symmetrical, trachea midline, no adenopathy;         Back:     Symmetric, no curvature, ROM normal, no CVA tenderness  Lungs:     Clear to auscultation bilaterally, respirations unlabored  Chest wall:    No tenderness or deformity  Heart:     Regular rate and rhythm, S1 and S2 normal, no murmur, rub   or gallop  Abdomen:     Soft, non-tender, bowel sounds active all four quadrants,    no masses, no organomegaly        Extremities:   Right leg muscular, leg leg trace pitting edema, still has blanching erythema to calf and ++1 pitting edema of ankle; right leg no c/c/e  Pulses:   2+ and symmetric all extremities  Skin:   Numerous hyperpigment plaque like lesions to lower extremities bilaterally. Toes of left foot has superficial desquamation. Blanching erythema-"that is normal for me"  Lymph nodes:   Cervical, supraclavicular, and axillary, inguinal nodes normal  Neurologic:   CNII-XII intact. Normal strength, sensation and reflexes      throughout   LABS: Lab Results  Component Value Date   WBC 13.8* 09/02/2011   HGB 13.1 09/02/2011  HCT 39.8 09/02/2011   MCV 89.6 09/02/2011   PLT 195 09/02/2011   . MICRO: Blood cx on 6/11 NGTD  IMAGING: LE ultrasound NEGATIVE for DVT  Assessment/Plan:  Left lower leg cellulitis, risk factors include eczema, with possible/early staph/strep toxic shock syndrome (since acute ill, high fevers, and gi symptoms) currently on vanco and amp/sub.  - will change to cefazolin 2gm IV q8hr, since it has good staph and streptococcal coverage. It doesn't appear that he has history of MRSA. - if continues to improve in the next 24hr, would switch to cephalexin 500mg  QID and treat for addn 7 days to finish 10-14 course of therapy - can consider getting ABI to evaluate for vascular insufficiency - slow delay to therapy maybe possibly related to lymphadema  Dr. Cliffton Asters will see patient on Saturday available for questions.  Duke Salvia Drue Second MD MPH [facility] [phone]

## 2011-09-04 NOTE — Progress Notes (Signed)
[facility] PROGRESS NOTE  Gregory Phillips ZOX:[id number] DOB: [birth year] DOA: 08/31/2011   Assessment/Plan: Patient Active Hospital Problem List: Cellulitis of left leg (09/01/2011) -bactrim d/c as leg worsen in less than 12 hrs. Changed back to unasyn and vanc. -patient erythema is significantly decreased,after starting unasyn and vanc. -The patient's wife wants ID consulted, I have informed her that ID will not contribute any further changes to her management but she insists. We have consulted ID as per family request.  Code Status: Full code Family Communication: Spouse [phone] Disposition Plan: home  Lambert Keto, MD  [facility] Pager [phone]  If 7PM-7AM, please contact night-coverage [email or web address] Password [email or web address] 09/04/2011, 10:07 AM   LOS: 4 days   Procedures:  Doppler 09/02/2011: No evidence of deep vein thrombosis involving the left lower extremity and right common femoral vein.     Antibiotics:  Vancomycin 09/01/2011>>> 09/02/2011  June 09/01/2011>>> 09/02/2011  Bactrim 09/02/2011>> 09/02/2011  Interim History:   Subjective: No pain, erythema decreased. Patient tolerating diet  Objective: Filed Vitals:   09/03/11 1329 09/03/11 2230 09/04/11 0452 09/04/11 0907  BP: 129/81 119/81 114/74   Pulse: 67 68 71   Temp: 97.7 F (36.5 C) 98 F (36.7 C) 97.8 F (36.6 C)   TempSrc: Oral Oral Oral   Resp: 20 20 18    Height:      Weight:      SpO2: 94% 98% 96% 98%    Intake/Output Summary (Last 24 hours) at 09/04/11 1007 Last data filed at 09/04/11 0600  Gross per 24 hour  Intake    850 ml  Output      0 ml  Net    850 ml   Weight change:   Exam:  General: Alert, awake, oriented x3, in no acute distress.  HEENT: No bruits, no goiter.  Heart: Regular rate and rhythm, without murmurs, rubs, gallops.  Lungs: Good air movement, bilateral air movement.  Abdomen: Soft, nontender, nondistended, positive bowel sounds.    Neuro: Grossly intact, nonfocal. Extremities: There is marking around his knee at shows significant decrease in his erythema. He has significant swelling of his lower extremity no point of entry for his cellulitis.   Data Reviewed: Basic Metabolic Panel:  Lab 09/02/11 6578 09/01/11 0256 08/31/11 2237  NA 138 138 138  K 3.9 3.5 --  CL 103 102 101  CO2 25 25 --  GLUCOSE 98 110* 110*  BUN 9 11 12   CREATININE 0.88 0.79 1.00  CALCIUM 8.6 8.6 --  MG -- -- --  PHOS -- -- --   Liver Function Tests:  Lab 09/01/11 0256  AST 16  ALT 16  ALKPHOS 52  BILITOT 1.3*  PROT 6.7  ALBUMIN 3.3*   No results found for this basename: LIPASE:5,AMYLASE:5 in the last 168 hours No results found for this basename: AMMONIA:5 in the last 168 hours CBC:  Lab 09/02/11 0500 09/01/11 0256 08/31/11 2237 08/31/11 2215  WBC 13.8* 21.1* -- 23.6*  NEUTROABS 11.3* -- -- 22.2*  HGB 13.1 14.1 16.0 14.7  HCT 39.8 42.1 47.0 42.6  MCV 89.6 88.8 -- 87.8  PLT 195 211 -- 226   Cardiac Enzymes: No results found for this basename: CKTOTAL:5,CKMB:5,CKMBINDEX:5,TROPONINI:5 in the last 168 hours BNP: No components found with this basename: POCBNP:5 CBG: No results found for this basename: GLUCAP:5 in the last 168 hours  Recent Results (from the past 240 hour(s))  CULTURE, BLOOD (ROUTINE X 2)     Status: Normal (Preliminary result)  Collection Time   09/01/11  2:35 AM      Component Value Range Status Comment   Specimen Description BLOOD LEFT ARM   Final    Special Requests BOTTLES DRAWN AEROBIC AND ANAEROBIC [facility]   Final    Culture  Setup Time [id number]   Final    Culture     Final    Value:        BLOOD CULTURE RECEIVED NO GROWTH TO DATE CULTURE WILL BE HELD FOR 5 DAYS BEFORE ISSUING A FINAL NEGATIVE REPORT   Report Status PENDING   Incomplete   CULTURE, BLOOD (ROUTINE X 2)     Status: Normal (Preliminary result)   Collection Time   09/01/11  2:50 AM      Component Value Range Status Comment    Specimen Description BLOOD RIGHT HAND   Final    Special Requests BOTTLES DRAWN AEROBIC AND ANAEROBIC [facility]   Final    Culture  Setup Time [id number]   Final    Culture     Final    Value:        BLOOD CULTURE RECEIVED NO GROWTH TO DATE CULTURE WILL BE HELD FOR 5 DAYS BEFORE ISSUING A FINAL NEGATIVE REPORT   Report Status PENDING   Incomplete      Studies: No results found.  Scheduled Meds:    . augmented betamethasone dipropionate   Topical QHS  . desonide  1 application Topical QHS  . fluticasone  1 spray Each Nare Daily  . Fluticasone-Salmeterol  1 puff Inhalation BID  . montelukast  10 mg Oral QHS  . simvastatin  10 mg Oral q1800  . vancomycin  750 mg Intravenous Q12H  . DISCONTD: ampicillin-sulbactam (UNASYN) IV  3 g Intravenous Q6H   Continuous Infusions:    . sodium chloride Stopped (09/03/11 0814)

## 2011-09-05 DIAGNOSIS — L02419 Cutaneous abscess of limb, unspecified: Secondary | ICD-10-CM

## 2011-09-05 DIAGNOSIS — L2089 Other atopic dermatitis: Secondary | ICD-10-CM

## 2011-09-05 DIAGNOSIS — J45909 Unspecified asthma, uncomplicated: Secondary | ICD-10-CM

## 2011-09-05 DIAGNOSIS — L03119 Cellulitis of unspecified part of limb: Secondary | ICD-10-CM

## 2011-09-05 LAB — BASIC METABOLIC PANEL
Calcium: 9.2 mg/dL (ref 8.4–10.5)
Creatinine, Ser: 0.8 mg/dL (ref 0.50–1.35)
GFR calc Af Amer: >90 mL/min (ref >90)

## 2011-09-05 MED ORDER — CEPHALEXIN 500 MG PO CAPS: 500.0000 mg | ORAL_CAPSULE | Freq: Three times a day (TID) | ORAL | Status: AC

## 2011-09-05 MED ORDER — CEPHALEXIN 500 MG PO CAPS: 500.0000 mg | ORAL_CAPSULE | Freq: Three times a day (TID) | ORAL | Status: DC

## 2011-09-05 MED ORDER — CEPHALEXIN 500 MG PO CAPS
500.0000 mg | ORAL_CAPSULE | Freq: Four times a day (QID) | ORAL | Status: DC
  Administered 2011-09-05 (×2): 500 mg via ORAL
  Filled 2011-09-05 (×5): qty 1

## 2011-09-05 NOTE — Progress Notes (Signed)
Patient being discharged today with discharge instructions reviewed with patient. IV removed without problem. Refused escort to door at this time. Valeda Malm

## 2011-09-05 NOTE — Discharge Summary (Signed)
Physician Discharge Summary  ZARED KNOTH ZOX:[id number] DOB: 03-27-1955 DOA: 08/31/2011  PCP: Marga Melnick, MD  Admit date: 08/31/2011 Discharge date: 09/05/2011  Discharge Diagnoses:  Principal Problem:  *Cellulitis of left leg  Consultants: Infectious disease [facility]  Discharge Condition: Stable at the time of discharge  Disposition:  Follow-up Information    Follow up with Marga Melnick, MD in 2 weeks.   Contact information:   [address] [facility] [address] [place] [address] [phone]       Follow up with Judyann Munson, MD in 1 week. [facility] followup)    Contact information:   [address] [place] [address] [phone]          Diet: Regular diet  History of present illness:  56 year old male with known history of asthma, eczema, hyperlipidemia and seminoma presented with complaints of fever chills and swelling and erythema of his left leg below his knee worsening extending up to his foot. The episode started last afternoon and slowly progress to his foot. Along with patient also experienced fever and chills. Patient also was recently treated for cellulitis of the same leg 2 months ago. At this time patient will be admitted for IV antibiotics for his cellulitis of his left leg. Denies any trauma or insect bites.    Hospital Course:  Cellulitis of the lower extremities: He was admitted to the hospital start her on IV vancomycin and Zosyn. By the next day his urine was improved. So he was changed to Bactrim by mouth. That afternoon his rent next got worse when he was changed back to vancomycin and Unasyn. The family requested an infectious disease consult which was obtained. ID he decided to put him on Ancef and he he did well overnight unassessable he was changed to Keflex which she will continue for 5 more days. His rent assessment doing decreased. And was able to tolerate his  diet.  Discharge Exam: Filed Vitals:   09/05/11 0618  BP: 111/68  Pulse: 71  Temp: 97.9 F (36.6 C)  Resp: 20   Filed Vitals:   09/04/11 0907 09/04/11 1412 09/04/11 2114 09/05/11 0618  BP:  124/77 130/83 111/68  Pulse:  72 70 71  Temp:  97.8 F (36.6 C) 98.3 F (36.8 C) 97.9 F (36.6 C)  TempSrc:  Oral Oral Oral  Resp:  18 18 20   Height:      Weight:      SpO2: 98% 98% 98% 94%   General: Alert, awake, oriented x3, in no acute distress.  HEENT: No bruits, no goiter.  Heart: Regular rate and rhythm, without murmurs, rubs, gallops.  Lungs: Good air movement, bilateral air movement.  Extremities: There is marking around his knee at shows significant decrease in his erythema. He has significant decrease in his swelling, no point of entry for his cellulitis.   Discharge Instructions  Discharge Orders    Future Orders Please Complete By Expires   Diet - low sodium heart healthy      Increase activity slowly        Medication List  As of 09/05/2011  9:42 AM   TAKE these medications         betamethasone dipropionate 0.05 % ointment   Commonly known as: DIPROLENE   Apply 1 application topically at bedtime.      cephALEXin 500 MG capsule   Commonly known as: KEFLEX   Take 1 capsule (500 mg total) by mouth  3 (three) times daily.      desonide 0.05 % ointment   Commonly known as: DESOWEN   Apply 1 application topically at bedtime.      Fluticasone-Salmeterol 100-50 MCG/DOSE Aepb   Commonly known as: ADVAIR   Inhale 1 puff into the lungs 2 (two) times daily.      mometasone 50 MCG/ACT nasal spray   Commonly known as: NASONEX   Place 2 sprays into the nose daily.      montelukast 10 MG tablet   Commonly known as: SINGULAIR   Take 10 mg by mouth at bedtime.      pravastatin 20 MG tablet   Commonly known as: PRAVACHOL   Take 20 mg by mouth at bedtime.      sildenafil 100 MG tablet   Commonly known as: VIAGRA   Take 100 mg by mouth as needed. As needed for  erectile dysfunction.      zolpidem 5 MG tablet   Commonly known as: AMBIEN   Take 5 mg by mouth at bedtime as needed. For sleep              The results of significant diagnostics from this hospitalization (including imaging, microbiology, ancillary and laboratory) are listed below for reference.    Significant Diagnostic Studies: No results found.  Microbiology: Recent Results (from the past 240 hour(s))  CULTURE, BLOOD (ROUTINE X 2)     Status: Normal (Preliminary result)   Collection Time   09/01/11  2:35 AM      Component Value Range Status Comment   Specimen Description BLOOD LEFT ARM   Final    Special Requests BOTTLES DRAWN AEROBIC AND ANAEROBIC 5CC EACH   Final    Culture  Setup Time [id number]   Final    Culture     Final    Value:        BLOOD CULTURE RECEIVED NO GROWTH TO DATE CULTURE WILL BE HELD FOR 5 DAYS BEFORE ISSUING A FINAL NEGATIVE REPORT   Report Status PENDING   Incomplete   CULTURE, BLOOD (ROUTINE X 2)     Status: Normal (Preliminary result)   Collection Time   09/01/11  2:50 AM      Component Value Range Status Comment   Specimen Description BLOOD RIGHT HAND   Final    Special Requests BOTTLES DRAWN AEROBIC AND ANAEROBIC [facility] EACH   Final    Culture  Setup Time [id number]   Final    Culture     Final    Value:        BLOOD CULTURE RECEIVED NO GROWTH TO DATE CULTURE WILL BE HELD FOR 5 DAYS BEFORE ISSUING A FINAL NEGATIVE REPORT   Report Status PENDING   Incomplete      Labs: Basic Metabolic Panel:  Lab 09/05/11 7846 09/02/11 0500 09/01/11 0256 08/31/11 2237  NA 138 138 138 138  K 3.4* 3.9 -- --  CL 101 103 102 101  CO2 28 25 25  --  GLUCOSE 95 98 110* 110*  BUN 10 9 11 12   CREATININE 0.80 0.88 0.79 1.00  CALCIUM 9.2 8.6 8.6 --  MG -- -- -- --  PHOS -- -- -- --   Liver Function Tests:  Lab 09/01/11 0256  AST 16  ALT 16  ALKPHOS 52  BILITOT 1.3*  PROT 6.7  ALBUMIN 3.3*   No results found for this basename: LIPASE:5,AMYLASE:5 in  the last 168 hours No results found for this basename: AMMONIA:5  in the last 168 hours CBC:  Lab 09/02/11 0500 09/01/11 0256 08/31/11 2237 08/31/11 2215  WBC 13.8* 21.1* -- 23.6*  NEUTROABS 11.3* -- -- 22.2*  HGB 13.1 14.1 16.0 14.7  HCT 39.8 42.1 47.0 42.6  MCV 89.6 88.8 -- 87.8  PLT 195 211 -- 226   Cardiac Enzymes: No results found for this basename: CKTOTAL:5,CKMB:5,CKMBINDEX:5,TROPONINI:5 in the last 168 hours BNP: No components found with this basename: POCBNP:5 CBG: No results found for this basename: GLUCAP:5 in the last 168 hours  Time coordinating discharge: Rhythm and 35 minutes  Signed:  Marinda Elk  [facility] 09/05/2011, 9:42 AM

## 2011-09-07 LAB — CULTURE, BLOOD (ROUTINE X 2)
Culture  Setup Time: 201306110852
Culture: NO GROWTH

## 2011-11-12 ENCOUNTER — Other Ambulatory Visit: Payer: Self-pay | Admitting: Internal Medicine

## 2011-11-12 NOTE — Telephone Encounter (Signed)
Copied from 04/2011 labs  repeat fasting labs in 10 weeks (lipids, hepatic panel, CK, A1c; 272.4, 995.20, 790.29).

## 2011-12-03 ENCOUNTER — Other Ambulatory Visit (INDEPENDENT_AMBULATORY_CARE_PROVIDER_SITE_OTHER): Payer: 59

## 2011-12-03 DIAGNOSIS — E785 Hyperlipidemia, unspecified: Secondary | ICD-10-CM

## 2011-12-03 DIAGNOSIS — R7309 Other abnormal glucose: Secondary | ICD-10-CM

## 2011-12-03 DIAGNOSIS — T887XXA Unspecified adverse effect of drug or medicament, initial encounter: Secondary | ICD-10-CM

## 2011-12-03 LAB — HEPATIC FUNCTION PANEL
ALT: 19 U/L (ref 0–53)
AST: 21 U/L (ref 0–37)
Alkaline Phosphatase: 52 U/L (ref 39–117)
Bilirubin, Direct: 0 mg/dL (ref 0.0–0.3)
Total Protein: 7.8 g/dL (ref 6.0–8.3)

## 2011-12-03 LAB — LIPID PANEL: Cholesterol: 198 mg/dL (ref 0–200)

## 2011-12-03 NOTE — Progress Notes (Signed)
Labs only

## 2011-12-09 ENCOUNTER — Other Ambulatory Visit: Payer: Self-pay | Admitting: Internal Medicine

## 2011-12-14 ENCOUNTER — Encounter (HOSPITAL_COMMUNITY): Payer: Self-pay | Admitting: *Deleted

## 2011-12-14 ENCOUNTER — Emergency Department (HOSPITAL_COMMUNITY)
Admission: EM | Admit: 2011-12-14 | Discharge: 2011-12-14 | Disposition: A | Discharge status: Home / Self Care | Payer: 59 | Source: Home / Self Care | Attending: Family Medicine | Admitting: Family Medicine

## 2011-12-14 DIAGNOSIS — H9209 Otalgia, unspecified ear: Secondary | ICD-10-CM

## 2011-12-14 DIAGNOSIS — H6590 Unspecified nonsuppurative otitis media, unspecified ear: Secondary | ICD-10-CM

## 2011-12-14 DIAGNOSIS — H9201 Otalgia, right ear: Secondary | ICD-10-CM

## 2011-12-14 MED ORDER — CETIRIZINE-PSEUDOEPHEDRINE ER 5-120 MG PO TB12: 1.0000 | ORAL_TABLET | Freq: Two times a day (BID) | ORAL | Status: DC

## 2011-12-14 NOTE — ED Provider Notes (Signed)
History     CSN: [id number]  Arrival date & time 12/14/11  1015   First MD Initiated Contact with Patient 12/14/11 1100      Chief Complaint  Patient presents with  . Cerumen Impaction    (Consider location/radiation/quality/duration/timing/severity/associated sxs/prior treatment) The history is provided by the patient.  Patient complains of decreased hearing to right ear.  States ear feels like it is "plugged up" associated with mild pain for three days..   Attempts to clear with warm water irrigation with no relief.  History of same to right ear; no history of chronic ear infections, hearing loss or ear tubes. Denies fever, upper respiratory symptoms and headache.  + clear drainage from ear; no neck pain.  Past Medical History  Diagnosis Date  . Hyperlipidemia   . Asthma   . Eczema   . Cellulitis 2001     LLE ; [facility]  . Cellulitis 2007    R elbow ; S/ P drainage by Dr Priscille Kluver  . Edema   . Cancer     hx of testicular  ca    Past Surgical History  Procedure Date  . Seminoma  resection 2005     Dr Annabell Howells  . Arthroscopy of knee      X 2 Dr Thurston Hole; 2 nd surgery for patellar instability  . Refractive surgery 2004  . Colonoscopy w/ polypectomy 05/14/2008    Tubular adenoma; Dr. Sheryn Bison  . I&d extremity 2004    elbow    Family History  Problem Relation Age of Onset  . Cancer Father     liver  . Heart disease Father     dysrrhymthmia  . Stroke Mother     mini  . Asthma Sister   . Cancer Paternal Grandfather     stomach  . Eczema Maternal Uncle     History  Substance Use Topics  . Smoking status: Never Smoker   . Smokeless tobacco: Never Used  . Alcohol Use: 0.0 oz/week     occasional      Review of Systems  Constitutional: Negative.   HENT: Positive for hearing loss, ear pain and ear discharge. Negative for nosebleeds, congestion, sore throat, rhinorrhea, sneezing, postnasal drip, sinus pressure and tinnitus.   Respiratory:  Negative.   Cardiovascular: Negative.   All other systems reviewed and are negative.    Allergies  Review of patient's allergies indicates no known allergies.  Home Medications   Current Outpatient Rx  Name Route Sig Dispense Refill  . BETAMETHASONE DIPROPIONATE 0.05 % EX OINT Topical Apply 1 application topically at bedtime.     Marland Kitchen CETIRIZINE-PSEUDOEPHEDRINE ER 5-120 MG PO TB12 Oral Take 1 tablet by mouth 2 (two) times daily. 30 tablet 0  . DESONIDE 0.05 % EX OINT Topical Apply 1 application topically at bedtime.     Marland Kitchen FLUTICASONE-SALMETEROL 100-50 MCG/DOSE IN AEPB Inhalation Inhale 1 puff into the lungs 2 (two) times daily.    . MOMETASONE FUROATE 50 MCG/ACT NA SUSP Nasal Place 2 sprays into the nose daily.    Marland Kitchen MONTELUKAST SODIUM 10 MG PO TABS Oral Take 10 mg by mouth at bedtime.    Marland Kitchen PRAVASTATIN SODIUM 20 MG PO TABS  TAKE ONE TABLET BY MOUTH ONE TIME DAILY 30 tablet 0  . SILDENAFIL CITRATE 100 MG PO TABS Oral Take 100 mg by mouth as needed. As needed for erectile dysfunction.    Marland Kitchen ZOLPIDEM TARTRATE 5 MG PO TABS Oral Take 5 mg by  mouth at bedtime as needed. For sleep      BP 140/74  Pulse 78  Temp 98.6 F (37 C)  Resp 18  SpO2 100%  Physical Exam  Nursing note and vitals reviewed. Constitutional: He is oriented to person, place, and time. Vital signs are normal. He appears well-developed and well-nourished. He is active and cooperative.  HENT:  Head: Normocephalic.  Right Ear: No drainage, swelling or tenderness. No foreign bodies. No mastoid tenderness. Tympanic membrane is not injected, not scarred, not perforated, not erythematous, not retracted and not bulging. No middle ear effusion. No hemotympanum. Decreased hearing is noted.  Left Ear: Hearing, tympanic membrane, external ear and ear canal normal. No mastoid tenderness. Tympanic membrane is not injected, not scarred, not perforated, not erythematous, not retracted and not bulging. No decreased hearing is noted.  Eyes:  Conjunctivae normal are normal. Pupils are equal, round, and reactive to light. No scleral icterus.  Neck: Trachea normal, normal range of motion and full passive range of motion without pain. Neck supple. No tracheal tenderness, no spinous process tenderness and no muscular tenderness present. No Brudzinski's sign noted. No thyromegaly present.  Cardiovascular: Normal rate, regular rhythm, normal heart sounds, intact distal pulses and normal pulses.   Pulmonary/Chest: Effort normal and breath sounds normal.  Lymphadenopathy:       Head (right side): No submental, no submandibular, no tonsillar, no preauricular, no posterior auricular and no occipital adenopathy present.       Head (left side): No submental, no submandibular, no tonsillar, no preauricular, no posterior auricular and no occipital adenopathy present.    He has no cervical adenopathy.  Neurological: He is alert and oriented to person, place, and time. No cranial nerve deficit or sensory deficit.  Skin: Skin is warm and dry.  Psychiatric: He has a normal mood and affect. His speech is normal and behavior is normal. Judgment and thought content normal. Cognition and memory are normal.    ED Course  Procedures (including critical care time)  Labs Reviewed - No data to display No results found.   1. Middle ear effusion   2. Otalgia of right ear       MDM  Antihistamines, follow up with ENT if symptoms are not improved, no cerumen noted, no ear infection.        Johnsie Kindred, NP 12/14/11 1121

## 2011-12-14 NOTE — ED Notes (Signed)
Pt  Reports  Sensation of  Clogged  r  Ear  -  He  Has  Noticed  Muffled  Sound  As  Well as   Some  Drainage  Evident  For  Several  Days    He  Is  Sitting upight on exam table  Speaking  in  Complete  sentances

## 2011-12-14 NOTE — ED Notes (Signed)
Patient called to ask question about his pain relief; asked about Rx drops. After discussion w provider, was instructed to use OTC auralgan, use decongestant for discomfort, and f/u with ENT as instructed. Verbalized unst of plan

## 2011-12-15 NOTE — ED Provider Notes (Signed)
Medical screening examination/treatment/procedure(s) were performed by non-physician practitioner and as supervising physician I was immediately available for consultation/collaboration.   [facility]; MD   Sharin Grave, MD 12/15/11 2127

## 2012-01-04 ENCOUNTER — Encounter: Payer: Self-pay | Admitting: Gastroenterology

## 2012-02-10 ENCOUNTER — Other Ambulatory Visit: Payer: Self-pay | Admitting: Internal Medicine

## 2012-02-10 MED ORDER — ZOLPIDEM TARTRATE 5 MG PO TABS: 5.0000 mg | ORAL_TABLET | Freq: Every evening | ORAL | Status: DC | PRN

## 2012-02-10 NOTE — Telephone Encounter (Signed)
Rx called in 

## 2012-02-10 NOTE — Telephone Encounter (Signed)
refill Zolpidem Tartrate (Tab) 5 MG last fill 7.22.13 (no instructions listed)   Last wrt as Zolpidem Tartrate (Tab) 5 MG Take 5 mg by mouth at bedtime as needed. For sleep

## 2012-02-11 ENCOUNTER — Other Ambulatory Visit: Payer: Self-pay | Admitting: Internal Medicine

## 2012-02-11 NOTE — Telephone Encounter (Signed)
Rx sent.    MW 

## 2012-02-14 ENCOUNTER — Other Ambulatory Visit: Payer: Self-pay | Admitting: Internal Medicine

## 2012-02-26 ENCOUNTER — Encounter: Payer: Self-pay | Admitting: Gastroenterology

## 2012-03-09 ENCOUNTER — Encounter: Payer: 59 | Admitting: Gastroenterology

## 2012-04-01 ENCOUNTER — Encounter: Payer: Self-pay | Admitting: Gastroenterology

## 2012-04-01 ENCOUNTER — Ambulatory Visit (AMBULATORY_SURGERY_CENTER): Payer: 59 | Admitting: *Deleted

## 2012-04-01 VITALS — Ht 65.0 in | Wt 162.0 lb

## 2012-04-01 DIAGNOSIS — Z1211 Encounter for screening for malignant neoplasm of colon: Secondary | ICD-10-CM

## 2012-04-01 MED ORDER — MOVIPREP 100 G PO SOLR: 1.0000 | Freq: Once | ORAL | Status: DC

## 2012-04-15 ENCOUNTER — Ambulatory Visit (AMBULATORY_SURGERY_CENTER): Payer: 59 | Admitting: Gastroenterology

## 2012-04-15 ENCOUNTER — Encounter: Payer: Self-pay | Admitting: Gastroenterology

## 2012-04-15 VITALS — BP 147/81 | HR 61 | Temp 97.8°F | Resp 21 | Ht 65.0 in | Wt 162.0 lb

## 2012-04-15 DIAGNOSIS — Z1211 Encounter for screening for malignant neoplasm of colon: Secondary | ICD-10-CM

## 2012-04-15 DIAGNOSIS — D126 Benign neoplasm of colon, unspecified: Secondary | ICD-10-CM

## 2012-04-15 MED ORDER — SODIUM CHLORIDE 0.9 % IV SOLN: 500.0000 mL | INTRAVENOUS | Status: DC

## 2012-04-15 NOTE — Op Note (Signed)
[facility] [address] [place], [address]   COLONOSCOPY PROCEDURE REPORT  PATIENT: Gregory, Phillips  MR#: #[id number] BIRTHDATE: June 28, 1955 , 56  yrs. old GENDER: Male ENDOSCOPIST: Mardella Layman, MD, [facility] REFERRED BY: PROCEDURE DATE:  04/15/2012 PROCEDURE:   Colonoscopy with snare polypectomy ASA CLASS:   Class II INDICATIONS:Colorectal cancer screening. MEDICATIONS: propofol (Diprivan) 300mg  IV  DESCRIPTION OF PROCEDURE:   After the risks and benefits and of the procedure were explained, informed consent was obtained.  A digital rectal exam revealed no abnormalities of the rectum.    The LB [id number]  endoscope was introduced through the anus and advanced to the cecum, which was identified by both the appendix and ileocecal valve .  The quality of the prep was excellent, using MoviPrep .  The instrument was then slowly withdrawn as the colon was fully examined.     COLON FINDINGS: Five sessile polyps ranging between 3-3mm in size were found at the hepatic flexure.  A polypectomy was performed with a cold snare.  The resection was complete and the polyp tissue was completely retrieved.   The colon was otherwise normal.  There was no diverticulosis, inflammation, polyps or cancers unless previously stated.     Retroflexed views revealed no abnormalities. The scope was then withdrawn from the patient and the procedure completed.  COMPLICATIONS: There were no complications. ENDOSCOPIC IMPRESSION: Five sessile polyps ranging between 3-63mm in size were found at the hepatic flexure; polypectomy was performed with a cold snare ...r/o adenomas...  RECOMMENDATIONS: 1.  Await pathology results 2.  Repeat Colonoscopy in 3 years.   REPEAT EXAM:  cc:Pecola Lawless, MD  _______________________________ eSigned:  Mardella Layman, MD, [facility] 04/15/2012 2:02 PM     PATIENT NAME:  Gregory, Phillips MR#: #[id number]

## 2012-04-15 NOTE — Progress Notes (Signed)
Patient did not experience any of the following events: a burn prior to discharge; a fall within the facility; wrong site/side/patient/procedure/implant event; or a hospital transfer or hospital admission upon discharge from the facility. (G8907) Patient did not have preoperative order for IV antibiotic SSI prophylaxis. (G8918)  

## 2012-04-15 NOTE — Progress Notes (Signed)
Stable to PACU  

## 2012-04-15 NOTE — Patient Instructions (Addendum)

## 2012-04-18 ENCOUNTER — Telehealth: Payer: Self-pay | Admitting: *Deleted

## 2012-04-18 NOTE — Telephone Encounter (Signed)
  Follow up Call-  Call back number 04/15/2012  Post procedure Call Back phone  # [phone]  Permission to leave phone message Yes    Spoke with wife.  She stated, "He's doing great." Patient questions:  Do you have a fever, pain , or abdominal swelling? no Pain Score  0 *  Have you tolerated food without any problems? yes  Have you been able to return to your normal activities? yes  Do you have any questions about your discharge instructions: Diet   no Medications  no Follow up visit  no  Do you have questions or concerns about your Care? no  Actions: * If pain score is 4 or above: No action needed, pain <4.

## 2012-04-20 ENCOUNTER — Encounter: Payer: Self-pay | Admitting: Gastroenterology

## 2012-05-07 ENCOUNTER — Other Ambulatory Visit: Payer: Self-pay

## 2012-05-16 ENCOUNTER — Other Ambulatory Visit: Payer: Self-pay | Admitting: Internal Medicine

## 2012-05-21 HISTORY — PX: FOOT FRACTURE SURGERY: SHX645

## 2012-05-29 ENCOUNTER — Other Ambulatory Visit: Payer: Self-pay | Admitting: Internal Medicine

## 2012-06-10 ENCOUNTER — Emergency Department (HOSPITAL_COMMUNITY): Payer: 59

## 2012-06-10 ENCOUNTER — Encounter (HOSPITAL_COMMUNITY): Payer: Self-pay | Admitting: Adult Health

## 2012-06-10 ENCOUNTER — Inpatient Hospital Stay (HOSPITAL_COMMUNITY)
Admission: EM | Admit: 2012-06-10 | Discharge: 2012-06-13 | DRG: 872 | Disposition: A | Discharge status: Home / Self Care | Payer: 59 | Attending: Internal Medicine | Admitting: Internal Medicine

## 2012-06-10 DIAGNOSIS — L03116 Cellulitis of left lower limb: Secondary | ICD-10-CM

## 2012-06-10 DIAGNOSIS — Z79899 Other long term (current) drug therapy: Secondary | ICD-10-CM

## 2012-06-10 DIAGNOSIS — Z860100 Personal history of colon polyps, unspecified: Secondary | ICD-10-CM

## 2012-06-10 DIAGNOSIS — J45909 Unspecified asthma, uncomplicated: Secondary | ICD-10-CM | POA: Diagnosis present

## 2012-06-10 DIAGNOSIS — C6290 Malignant neoplasm of unspecified testis, unspecified whether descended or undescended: Secondary | ICD-10-CM

## 2012-06-10 DIAGNOSIS — L02419 Cutaneous abscess of limb, unspecified: Secondary | ICD-10-CM | POA: Diagnosis present

## 2012-06-10 DIAGNOSIS — I890 Lymphedema, not elsewhere classified: Secondary | ICD-10-CM | POA: Diagnosis present

## 2012-06-10 DIAGNOSIS — L259 Unspecified contact dermatitis, unspecified cause: Secondary | ICD-10-CM | POA: Diagnosis present

## 2012-06-10 DIAGNOSIS — E782 Mixed hyperlipidemia: Secondary | ICD-10-CM

## 2012-06-10 DIAGNOSIS — R599 Enlarged lymph nodes, unspecified: Secondary | ICD-10-CM | POA: Diagnosis present

## 2012-06-10 DIAGNOSIS — L2089 Other atopic dermatitis: Secondary | ICD-10-CM

## 2012-06-10 DIAGNOSIS — E785 Hyperlipidemia, unspecified: Secondary | ICD-10-CM | POA: Diagnosis present

## 2012-06-10 DIAGNOSIS — A419 Sepsis, unspecified organism: Principal | ICD-10-CM | POA: Diagnosis present

## 2012-06-10 LAB — CBC WITH DIFFERENTIAL/PLATELET
Basophils Relative: 0 % (ref 0–1)
Eosinophils Absolute: 0 10*3/uL (ref 0.0–0.7)
HCT: 43.7 % (ref 39.0–52.0)
Hemoglobin: 15.2 g/dL (ref 13.0–17.0)
MCH: 30.6 pg (ref 26.0–34.0)
MCHC: 34.8 g/dL (ref 30.0–36.0)
Monocytes Absolute: 1.2 10*3/uL — ABNORMAL HIGH (ref 0.1–1.0)
Monocytes Relative: 4 % (ref 3–12)

## 2012-06-10 LAB — URINALYSIS, ROUTINE W REFLEX MICROSCOPIC
Leukocytes, UA: NEGATIVE
Nitrite: NEGATIVE
Protein, ur: NEGATIVE mg/dL
Specific Gravity, Urine: 1.023 (ref 1.005–1.030)
Urobilinogen, UA: 0.2 mg/dL (ref 0.0–1.0)

## 2012-06-10 LAB — BASIC METABOLIC PANEL
BUN: 11 mg/dL (ref 6–23)
Creatinine, Ser: 0.91 mg/dL (ref 0.50–1.35)
GFR calc Af Amer: >90 mL/min (ref >90)
GFR calc non Af Amer: >90 mL/min (ref >90)

## 2012-06-10 MED ORDER — CEFAZOLIN SODIUM 1-5 GM-% IV SOLN: 1.0000 g | Freq: Once | INTRAVENOUS | Status: DC

## 2012-06-10 MED ORDER — ACETAMINOPHEN 325 MG PO TABS
650.0000 mg | ORAL_TABLET | Freq: Once | ORAL | Status: AC
  Administered 2012-06-10: 650 mg via ORAL
  Filled 2012-06-10: qty 2

## 2012-06-10 MED ORDER — CEFAZOLIN SODIUM-DEXTROSE 2-3 GM-% IV SOLR
2.0000 g | Freq: Once | INTRAVENOUS | Status: AC
  Administered 2012-06-10: 2 g via INTRAVENOUS
  Filled 2012-06-10: qty 50

## 2012-06-10 MED ORDER — CEFAZOLIN SODIUM 1-5 GM-% IV SOLN
1.0000 g | Freq: Four times a day (QID) | INTRAVENOUS | Status: DC
  Administered 2012-06-11 (×3): 1 g via INTRAVENOUS
  Filled 2012-06-10 (×4): qty 50

## 2012-06-10 MED ORDER — CLINDAMYCIN PHOSPHATE 600 MG/50ML IV SOLN
600.0000 mg | Freq: Once | INTRAVENOUS | Status: AC
  Administered 2012-06-10: 600 mg via INTRAVENOUS
  Filled 2012-06-10: qty 50

## 2012-06-10 MED ORDER — SODIUM CHLORIDE 0.9 % IV SOLN
INTRAVENOUS | Status: AC
  Administered 2012-06-10: via INTRAVENOUS

## 2012-06-10 MED ORDER — SODIUM CHLORIDE 0.9 % IV BOLUS (SEPSIS)
1000.0000 mL | Freq: Once | INTRAVENOUS | Status: AC
  Administered 2012-06-10: 1000 mL via INTRAVENOUS

## 2012-06-10 MED ORDER — ACETAMINOPHEN 325 MG PO TABS: 650.0000 mg | ORAL_TABLET | Freq: Once | ORAL | Status: DC

## 2012-06-10 MED ORDER — SIMVASTATIN 10 MG PO TABS
10.0000 mg | ORAL_TABLET | Freq: Every day | ORAL | Status: DC
  Administered 2012-06-11 – 2012-06-12 (×2): 10 mg via ORAL
  Filled 2012-06-10 (×3): qty 1

## 2012-06-10 MED ORDER — MOMETASONE FURO-FORMOTEROL FUM 100-5 MCG/ACT IN AERO
2.0000 | INHALATION_SPRAY | Freq: Two times a day (BID) | RESPIRATORY_TRACT | Status: DC
  Administered 2012-06-11 – 2012-06-13 (×6): 2 via RESPIRATORY_TRACT
  Filled 2012-06-10: qty 8.8

## 2012-06-10 MED ORDER — CLINDAMYCIN PHOSPHATE 600 MG/50ML IV SOLN
600.0000 mg | Freq: Four times a day (QID) | INTRAVENOUS | Status: DC
  Administered 2012-06-11 – 2012-06-12 (×6): 600 mg via INTRAVENOUS
  Filled 2012-06-10 (×7): qty 50

## 2012-06-10 MED ORDER — HEPARIN SODIUM (PORCINE) 5000 UNIT/ML IJ SOLN
5000.0000 [IU] | Freq: Three times a day (TID) | INTRAMUSCULAR | Status: DC
  Filled 2012-06-10 (×10): qty 1

## 2012-06-10 NOTE — ED Notes (Signed)
Patient transported to X-ray 

## 2012-06-10 NOTE — ED Notes (Signed)
Presents with left leg redness, swelling, warmth, pain. Began this AM at 6, associated with nausea and vomiting. THis happened before and pt was hospitalized with a variation of strain of Strep in June of 2013. CMS intact.

## 2012-06-10 NOTE — ED Provider Notes (Signed)
History     CSN: [id number]  Arrival date & time 06/10/12  1703   First MD Initiated Contact with Patient 06/10/12 1940      Chief Complaint  Patient presents with  . Recurrent Skin Infections    (Consider location/radiation/quality/duration/timing/severity/associated sxs/prior treatment) The history is provided by the patient.  Kobyn Kray. is a 57 y.o. male history of hyperlipidemia, eczema here presenting with rigors and left leg cellulitis. He started having some fevers and rigors this morning. He then had left inguinal node swelling. Subsequently his left leg developed cellulitis. Denies any history of DVTs or recent travel. He has similar episode a year ago and was admitted and was diagnosed with possible early strep toxic shock syndrome and was initially placed on Vanc and Zosyn, which didn't help, and eventually on ancef, which improved his symptoms. He had neg DVT study at that time.    Past Medical History  Diagnosis Date  . Hyperlipidemia   . Asthma   . Eczema   . Cellulitis 2001     LLE ; [facility]  . Cellulitis 2007    R elbow ; S/ P drainage by Dr Priscille Kluver  . Edema   . Cancer 2004    hx of testicular  ca    Past Surgical History  Procedure Laterality Date  . Seminoma  resection  2005     Dr Annabell Howells  . Arthroscopy of knee       X 2 Dr Thurston Hole; 2 nd surgery for patellar instability  . Refractive surgery  2004  . Colonoscopy w/ polypectomy  05/14/2008    Tubular adenoma; Dr. Sheryn Bison  . I&d extremity  2004    elbow    Family History  Problem Relation Age of Onset  . Cancer Father     liver  . Heart disease Father     dysrrhymthmia  . Colon cancer Father   . Liver cancer Father   . Stroke Mother     mini  . Asthma Sister   . Cancer Paternal Grandfather     stomach  . Eczema Maternal Uncle     History  Substance Use Topics  . Smoking status: Never Smoker   . Smokeless tobacco: Never Used  . Alcohol Use: 4.2 oz/week   7 Cans of beer per week     Comment: occasional      Review of Systems  Musculoskeletal:       R leg pain   Skin: Positive for rash.  All other systems reviewed and are negative.    Allergies  Review of patient's allergies indicates no known allergies.  Home Medications   Current Outpatient Rx  Name  Route  Sig  Dispense  Refill  . betamethasone dipropionate (DIPROLENE) 0.05 % ointment   Topical   Apply 1 application topically at bedtime.          Marland Kitchen desonide (DESOWEN) 0.05 % ointment   Topical   Apply 1 application topically at bedtime.          . Fluticasone-Salmeterol (ADVAIR) 100-50 MCG/DOSE AEPB   Inhalation   Inhale 1 puff into the lungs 2 (two) times daily.         . pravastatin (PRAVACHOL) 20 MG tablet      TAKE 1 TABLET BY MOUTH ONCE DAILY   90 tablet   1   . VIAGRA 100 MG tablet      TAKE 1 TABLET BY MOUTH AS NEEDED FOR  ERECTILE DYSFUNCTION   12 tablet   0     BP 126/83  Pulse 79  Temp(Src) 100.5 F (38.1 C) (Oral)  Resp 20  SpO2 94%  Physical Exam  Nursing note and vitals reviewed. Constitutional: He is oriented to person, place, and time. He appears well-developed and well-nourished.  Uncomfortable   HENT:  Head: Normocephalic.  Mouth/Throat: Oropharynx is clear and moist.  Eyes: Conjunctivae are normal. Pupils are equal, round, and reactive to light.  Neck: Normal range of motion. Neck supple.  Cardiovascular: Normal rate, regular rhythm and normal heart sounds.   Pulmonary/Chest: Breath sounds normal. No respiratory distress. He has no wheezes. He has no rales.  Abdominal: Soft. Bowel sounds are normal. He exhibits no distension. There is no tenderness. There is no rebound and no guarding.  Musculoskeletal:  Tender L inguinal lymph node. Mild swelling of L leg. No calf tenderness. 2+ pulses bilaterally.   Neurological: He is alert and oriented to person, place, and time.  Skin: Skin is warm and dry.  Circumferential cellulitis  of L calf, no obvious skin breakdown.   Psychiatric: He has a normal mood and affect. His behavior is normal. Judgment and thought content normal.    ED Course  Procedures (including critical care time)  Labs Reviewed  CBC WITH DIFFERENTIAL - Abnormal; Notable for the following:    WBC 26.4 (*)    Neutrophils Relative 94 (*)    Neutro Abs 24.7 (*)    Lymphocytes Relative 2 (*)    Lymphs Abs 0.4 (*)    Monocytes Absolute 1.2 (*)    All other components within normal limits  BASIC METABOLIC PANEL - Abnormal; Notable for the following:    Glucose, Bld 128 (*)    All other components within normal limits  URINALYSIS, ROUTINE W REFLEX MICROSCOPIC - Abnormal; Notable for the following:    Ketones, ur 15 (*)    All other components within normal limits  URINE CULTURE  CULTURE, BLOOD (ROUTINE X 2)  CULTURE, BLOOD (ROUTINE X 2)  LACTIC ACID, PLASMA   Dg Chest 2 View  06/10/2012  *RADIOLOGY REPORT*  Clinical Data: Fever, left leg cellulitis  CHEST - 2 VIEW  Comparison: 05/15/11  Findings: Cardiomediastinal silhouette is stable.  No acute infiltrate or pleural effusion.  No pulmonary edema.  Bony thorax is stable.  IMPRESSION: No active disease.  No significant change.   Original Report Authenticated By: Natasha Mead, M.D.      No diagnosis found.    MDM  [name]. is a 57 y.o. male here with L leg cellulitis and fever. He is not hypotensive but I am concerned for early strep toxic shock. Will do sepsis workup. Will start on ancef for now (since he said vanc/zosyn didn't help previously and no hx of MRSA). Not concerned of DVT. Will likely need admission.   9:29 PM WBC 26, I think likely from sepsis from cellulitis. Not concerned for necrotizing fasciitis, likely strep or staph. I talked to Dr. Julian Reil, who recommended adding clindamycin to ancef. Will admit to med/surg.        Richardean Canal, MD 06/10/12 2131

## 2012-06-10 NOTE — H&P (Signed)
[facility] History and Physical  Robet Crutchfield. RUE:[id number] DOB: 12/24/1955 DOA: 06/10/2012  Referring physician: ED PCP: Marga Melnick, MD  Specialists: None  Chief Complaint: LLE cellulitis  HPI: Gregory Phillips. is a 57 y.o. male presents with c/o fever, rigors this morning, noted L inguinal lymph node swelling, and subsequently his L leg developed cellulitis.  No h/o DVTs nor recent travel.  Has had multiple episodes in the past, most recently in June of last year, was admitted initially placed on vanc and zosyn which per patient and wife didn't help at that time, ultimately he was changed to ancef which improved and resolved his symptoms during that admit.  Presentation today is nearly identical to previous cellulitis episodes he notes, in the ED patient was started on ancef and clindamycin, hospitalist asked to admit.  Review of Systems: 12 systems reviewed and otherwise negative.  Past Medical History  Diagnosis Date  . Hyperlipidemia   . Asthma   . Eczema   . Cellulitis 2001     LLE ; [facility]  . Cellulitis 2007    R elbow ; S/ P drainage by Dr Priscille Kluver  . Edema   . Cancer 2004    hx of testicular  ca   Past Surgical History  Procedure Laterality Date  . Seminoma  resection  2005     Dr Annabell Howells  . Arthroscopy of knee       X 2 Dr Thurston Hole; 2 nd surgery for patellar instability  . Refractive surgery  2004  . Colonoscopy w/ polypectomy  05/14/2008    Tubular adenoma; Dr. Sheryn Bison  . I&d extremity  2004    elbow   Social History:  reports that he has never smoked. He has never used smokeless tobacco. He reports that he drinks about 4.2 ounces of alcohol per week. He reports that he does not use illicit drugs.   No Known Allergies  Family History  Problem Relation Age of Onset  . Cancer Father     liver  . Heart disease Father     dysrrhymthmia  . Colon cancer Father   . Liver cancer Father   . Stroke Mother     mini  .  Asthma Sister   . Cancer Paternal Grandfather     stomach  . Eczema Maternal Uncle     Prior to Admission medications   Medication Sig Start Date End Date Taking? Authorizing Provider  betamethasone dipropionate (DIPROLENE) 0.05 % ointment Apply 1 application topically at bedtime.    Yes Historical Provider, MD  desonide (DESOWEN) 0.05 % ointment Apply 1 application topically at bedtime.    Yes Historical Provider, MD  Fluticasone-Salmeterol (ADVAIR) 100-50 MCG/DOSE AEPB Inhale 1 puff into the lungs 2 (two) times daily.   Yes Pecola Lawless, MD  pravastatin (PRAVACHOL) 20 MG tablet TAKE 1 TABLET BY MOUTH ONCE DAILY 05/16/12  Yes Pecola Lawless, MD  VIAGRA 100 MG tablet TAKE 1 TABLET BY MOUTH AS NEEDED FOR ERECTILE DYSFUNCTION 05/29/12  Yes Pecola Lawless, MD   Physical Exam: Filed Vitals:   06/10/12 1720 06/10/12 2201 06/10/12 2240  BP: 126/83 114/71 110/66  Pulse: 79 94 86  Temp: 100.5 F (38.1 C) 98.9 F (37.2 C) 98.3 F (36.8 C)  TempSrc: Oral Oral Oral  Resp: 20 18 18   Weight:   74.4 kg (164 lb 0.4 oz)  SpO2: 94% 95% 95%    General:  NAD, resting comfortably in bed,  nontoxic appearing Eyes: PEERLA EOMI ENT: mucous membranes moist Neck: supple w/o JVD Cardiovascular: RRR w/o MRG Respiratory: CTA B Abdomen: soft, nt, nd, bs+ Skin: LLE with mild swelling and significant erythema noted from his foot to just above his knee, there is also a tender L inguinal lymph node, no obvious skin break down, no crepitus, mildly TTP, no pain beyond site of erythema Musculoskeletal: MAE, full ROM all 4 extremities Psychiatric: normal tone and affect Neurologic: AAOx3, grossly non-focal  Labs on Admission:  Basic Metabolic Panel:  Recent Labs Lab 06/10/12 1727  NA 135  K 4.2  CL 97  CO2 25  GLUCOSE 128*  BUN 11  CREATININE 0.91  CALCIUM 9.0   Liver Function Tests: No results found for this basename: AST, ALT, ALKPHOS, BILITOT, PROT, ALBUMIN,  in the last 168 hours No  results found for this basename: LIPASE, AMYLASE,  in the last 168 hours No results found for this basename: AMMONIA,  in the last 168 hours CBC:  Recent Labs Lab 06/10/12 1727  WBC 26.4*  NEUTROABS 24.7*  HGB 15.2  HCT 43.7  MCV 88.1  PLT 215   Cardiac Enzymes: No results found for this basename: CKTOTAL, CKMB, CKMBINDEX, TROPONINI,  in the last 168 hours  BNP (last 3 results) No results found for this basename: PROBNP,  in the last 8760 hours CBG: No results found for this basename: GLUCAP,  in the last 168 hours  Radiological Exams on Admission: Dg Chest 2 View  06/10/2012  *RADIOLOGY REPORT*  Clinical Data: Fever, left leg cellulitis  CHEST - 2 VIEW  Comparison: 05/15/11  Findings: Cardiomediastinal silhouette is stable.  No acute infiltrate or pleural effusion.  No pulmonary edema.  Bony thorax is stable.  IMPRESSION: No active disease.  No significant change.   Original Report Authenticated By: Natasha Mead, M.D.     EKG: Independently reviewed.  Assessment/Plan Active Problems:   Cellulitis of left leg   1. Cellulitis - suspect strep cellulitis which is what he has had multiple times in the past, in addition to ancef have added clindamycin for treatment.  Unclear why he keeps developing recurrent cellulitis, no obvious skin breakdown, but apparently his cellulitis is always the same presentation and always on the LLE.  May wish to see if ID has any ideas why he keeps re-developing this in the absence of an obvious cause. 2. Sepsis - with leukocytosis and mild fever documented of 100.5, CBC ordered for AM, fluids at 100 cc/hr for now.    Code Status: Full Code (must indicate code status--if unknown or must be presumed, indicate so) Family Communication: Spoke with wife at bedside (indicate person spoken with, if applicable, with phone number if by telephone) Disposition Plan: Admit to inpatient (indicate anticipated LOS)  Time spent: 50 min  Burl Tauzin M. [facility] Pager [phone]  If 7PM-7AM, please contact night-coverage [email or web address] Password [facility] 06/10/2012, 11:04 PM

## 2012-06-11 DIAGNOSIS — E782 Mixed hyperlipidemia: Secondary | ICD-10-CM

## 2012-06-11 DIAGNOSIS — J45909 Unspecified asthma, uncomplicated: Secondary | ICD-10-CM

## 2012-06-11 LAB — BASIC METABOLIC PANEL
BUN: 10 mg/dL (ref 6–23)
CO2: 26 mEq/L (ref 19–32)
Calcium: 8.4 mg/dL (ref 8.4–10.5)
Glucose, Bld: 114 mg/dL — ABNORMAL HIGH (ref 70–99)
Sodium: 138 mEq/L (ref 135–145)

## 2012-06-11 LAB — CBC
HCT: 37.5 % — ABNORMAL LOW (ref 39.0–52.0)
Hemoglobin: 13.1 g/dL (ref 13.0–17.0)
MCH: 30.1 pg (ref 26.0–34.0)
MCV: 86.2 fL (ref 78.0–100.0)
RBC: 4.35 MIL/uL (ref 4.22–5.81)

## 2012-06-11 MED ORDER — CEFAZOLIN SODIUM-DEXTROSE 2-3 GM-% IV SOLR
2.0000 g | Freq: Three times a day (TID) | INTRAVENOUS | Status: DC
  Administered 2012-06-11 – 2012-06-13 (×6): 2 g via INTRAVENOUS
  Filled 2012-06-11 (×8): qty 50

## 2012-06-11 NOTE — Consult Note (Signed)
[facility]  Total days of antibiotics 2        Day 2 cefazolin        Day 2 clindamycin               Reason for Consult: recurrent cellulitis    Referring Physician: [email or web address]  Active Problems:   Cellulitis of left leg   Sepsis    HPI: Gregory Phillips. is a 57 y.o. male  with history of seminoma s/p Right radical orchiectomy and inguinal hernia repair in 2005, hx of eczema, and chronic unilateraly left leg lymphedema dating back to mid 1980s who has had recurrent episodes of left leg cellulitis in April 2013, June 2013 when he was last admitted, treated with cefazolin-> keflex. He presents to ED with acute onset of fevers, rigors, dry heaves, left inguinal lymphadenopathy, and abrupt onset of erythema to his left leg lower extremity from toes to above the knee with associated worsening swelling of his leg. He denies trauma, any animal scratches, plant/shrub scratches to his leg. He recently returned from [address], [address] In the ED, he was found to be febrile 100.5, no tachycardia, no hypotension, but impressive leukocytosis of 26.4 with left shift, 94%N. He was started on cefazolin and clindamycin for which he has showed some improvement in the first 24hrs with leukocytosis decreased to 15.8. Remains afebrile.   He has been healthy otherwise. He only uses topical cream for eczema.  No history of diabetes.never known to have claudication. Or pain with ambulation. Wears compression stalking for lymphadema. 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 No recent illness.  Since starting antibiotics he feels that there is less heat eminating from the leg, less redness, receeding as well, no longer involving his knee. He says his leg still feels tight.    Past Medical History  Diagnosis Date  . Hyperlipidemia   . Asthma   . Eczema   . Cellulitis 2001     LLE ; [facility]  . Cellulitis 2007    R elbow ; S/ P drainage by Dr Priscille Kluver  . Edema   . Cancer 2004    hx of testicular  ca    Allergies: No Known Allergies  MEDICATIONS: .  ceFAZolin (ANCEF) IV  1 g Intravenous Q6H  . clindamycin (CLEOCIN) IV  600 mg Intravenous Q6H  . heparin  5,000 Units Subcutaneous Q8H  . mometasone-formoterol  2 puff Inhalation BID  . simvastatin  10 mg Oral q1800    History  Substance Use Topics  . Smoking status: Never Smoker   . Smokeless tobacco: Never Used  . Alcohol Use: 4.2 oz/week    7 Cans of beer per week     Comment: occasional    Family History  Problem Relation Age of Onset  . Cancer Father     liver  . Heart disease Father     dysrrhymthmia  . Colon cancer Father   . Liver cancer Father   . Stroke Mother     mini  . Asthma Sister   . Cancer Paternal Grandfather     stomach  . Eczema Maternal Uncle      Review of Systems  Constitutional: positive for fever, chills, diaphoresis,  But negative foractivity change, appetite change, fatigue and unexpected weight change.  HENT: Negative for congestion, sore throat, rhinorrhea, sneezing, trouble swallowing and  sinus pressure.  Eyes: Negative for photophobia and visual disturbance.  Respiratory: Negative for cough, chest tightness, shortness of breath, wheezing and stridor.  Cardiovascular: Negative for chest pain, palpitations and leg swelling.  Gastrointestinal: Negative for nausea, vomiting, abdominal pain, diarrhea, constipation, blood in stool, abdominal distention and anal bleeding.  Genitourinary: Negative for dysuria, hematuria, flank pain and difficulty urinating.  Musculoskeletal: Negative for myalgias, back pain, joint swelling, arthralgias and gait problem.  Skin: positive for rash Neurological: Negative for dizziness, tremors, weakness and light-headedness.  Hematological: Negative for adenopathy. Does not bruise/bleed easily.  Psychiatric/Behavioral:  Negative for behavioral problems, confusion, sleep disturbance, dysphoric mood, decreased concentration and agitation.     OBJECTIVE: Temp:  [98.3 F (36.8 C)-100.5 F (38.1 C)] 98.9 F (37.2 C) (03/22 0913) Pulse Rate:  [79-94] 92 (03/22 0913) Resp:  [18-20] 18 (03/22 0913) BP: (100-126)/(62-83) 116/66 mmHg (03/22 0913) SpO2:  [93 %-95 %] 94 % (03/22 0913) Weight:  [164 lb 0.4 oz (74.4 kg)] 164 lb 0.4 oz (74.4 kg) (03/21 2240)  Constitutional: He is oriented to person, place, and time. He appears well-developed and well-nourished. Flushed face HENT:  Head: Normocephalic.  Mouth/Throat: Oropharynx is clear and moist.  Eyes: Conjunctivae are normal. Pupils are equal, round, and reactive to light.  Neck: Normal range of motion. Neck supple.  Cardiovascular: Normal rate, regular rhythm and normal heart sounds.  Pulmonary/Chest: Breath sounds normal. No respiratory distress. He has no wheezes. He has no rales.  Abdominal: Soft. Bowel sounds are normal. He exhibits no distension. There is no tenderness. There is no rebound and no guarding.  Musculoskeletal: Tender L inguinal lymph node. Mild swelling of L leg. No calf tenderness. 2+ pulses bilaterally.  Neurological: He is alert and oriented to person, place, and time.  Skin: Skin is warm and dry. Circumferential cellulitis of L calf, blanching erythema no sharp demarcation, pitting edema of +1 on left leg, no obvious skin breakdown.  Psychiatric: He has a normal mood and affect. His behavior is normal. Judgment and thought content normal.   LABS: Results for orders placed during the hospital encounter of 06/10/12 (from the past 48 hour(s))  CBC WITH DIFFERENTIAL     Status: Abnormal   Collection Time    06/10/12  5:27 PM      Result Value Range   WBC 26.4 (*) 4.0 - 10.5 K/uL   RBC 4.96  4.22 - 5.81 MIL/uL   Hemoglobin 15.2  13.0 - 17.0 g/dL   HCT 16.1  09.6 - 04.5 %   MCV 88.1  78.0 - 100.0 fL   MCH 30.6  26.0 - 34.0 pg   MCHC  34.8  30.0 - 36.0 g/dL   RDW 40.9  81.1 - 91.4 %   Platelets 215  150 - 400 K/uL   Neutrophils Relative 94 (*) 43 - 77 %   Neutro Abs 24.7 (*) 1.7 - 7.7 K/uL   Lymphocytes Relative 2 (*) 12 - 46 %   Lymphs Abs 0.4 (*) 0.7 - 4.0 K/uL   Monocytes Relative 4  3 - 12 %   Monocytes Absolute 1.2 (*) 0.1 - 1.0 K/uL   Eosinophils Relative 0  0 - 5 %   Eosinophils Absolute 0.0  0.0 - 0.7 K/uL   Basophils Relative 0  0 - 1 %   Basophils Absolute 0.0  0.0 - 0.1 K/uL  BASIC METABOLIC PANEL     Status: Abnormal   Collection Time    06/10/12  5:27 PM  Result Value Range   Sodium 135  135 - 145 mEq/L   Potassium 4.2  3.5 - 5.1 mEq/L   Chloride 97  96 - 112 mEq/L   CO2 25  19 - 32 mEq/L   Glucose, Bld 128 (*) 70 - 99 mg/dL   BUN 11  6 - 23 mg/dL   Creatinine, Ser 1.61  0.50 - 1.35 mg/dL   Calcium 9.0  8.4 - 09.6 mg/dL   GFR calc non Af Amer >90  >90 mL/min   GFR calc Af Amer >90  >90 mL/min   Comment:            The eGFR has been calculated     using the CKD EPI equation.     This calculation has not been     validated in all clinical     situations.     eGFR's persistently     <90 mL/min signify     possible Chronic Kidney Disease.  URINALYSIS, ROUTINE W REFLEX MICROSCOPIC     Status: Abnormal   Collection Time    06/10/12  8:37 PM      Result Value Range   Color, Urine YELLOW  YELLOW   APPearance CLEAR  CLEAR   Specific Gravity, Urine 1.023  1.005 - 1.030   pH 7.5  5.0 - 8.0   Glucose, UA NEGATIVE  NEGATIVE mg/dL   Hgb urine dipstick NEGATIVE  NEGATIVE   Bilirubin Urine NEGATIVE  NEGATIVE   Ketones, ur 15 (*) NEGATIVE mg/dL   Protein, ur NEGATIVE  NEGATIVE mg/dL   Urobilinogen, UA 0.2  0.0 - 1.0 mg/dL   Nitrite NEGATIVE  NEGATIVE   Leukocytes, UA NEGATIVE  NEGATIVE   Comment: MICROSCOPIC NOT DONE ON URINES WITH NEGATIVE PROTEIN, BLOOD, LEUKOCYTES, NITRITE, OR GLUCOSE <1000 mg/dL.  LACTIC ACID, PLASMA     Status: Abnormal   Collection Time    06/10/12  8:55 PM      Result  Value Range   Lactic Acid, Venous 3.4 (*) 0.5 - 2.2 mmol/L  CBC     Status: Abnormal   Collection Time    06/11/12  5:55 AM      Result Value Range   WBC 15.8 (*) 4.0 - 10.5 K/uL   RBC 4.35  4.22 - 5.81 MIL/uL   Hemoglobin 13.1  13.0 - 17.0 g/dL   HCT 04.5 (*) 40.9 - 81.1 %   MCV 86.2  78.0 - 100.0 fL   MCH 30.1  26.0 - 34.0 pg   MCHC 34.9  30.0 - 36.0 g/dL   RDW 91.4  78.2 - 95.6 %   Platelets 187  150 - 400 K/uL  BASIC METABOLIC PANEL     Status: Abnormal   Collection Time    06/11/12  5:55 AM      Result Value Range   Sodium 138  135 - 145 mEq/L   Potassium 3.5  3.5 - 5.1 mEq/L   Chloride 103  96 - 112 mEq/L   CO2 26  19 - 32 mEq/L   Glucose, Bld 114 (*) 70 - 99 mg/dL   BUN 10  6 - 23 mg/dL   Creatinine, Ser 2.13  0.50 - 1.35 mg/dL   Calcium 8.4  8.4 - 08.6 mg/dL   GFR calc non Af Amer >90  >90 mL/min   GFR calc Af Amer >90  >90 mL/min   Comment:            The eGFR  has been calculated     using the CKD EPI equation.     This calculation has not been     validated in all clinical     situations.     eGFR's persistently     <90 mL/min signify     possible Chronic Kidney Disease.  GLUCOSE, CAPILLARY     Status: Abnormal   Collection Time    06/11/12  7:56 AM      Result Value Range   Glucose-Capillary 127 (*) 70 - 99 mg/dL    MICRO: 1/61 blood cx x 2 NGTD IMAGING: Dg Chest 2 View  06/10/2012  *RADIOLOGY REPORT*  Clinical Data: Fever, left leg cellulitis  CHEST - 2 VIEW  Comparison: 05/15/11  Findings: Cardiomediastinal silhouette is stable.  No acute infiltrate or pleural effusion.  No pulmonary edema.  Bony thorax is stable.  IMPRESSION: No active disease.  No significant change.   Original Report Authenticated By: Natasha Mead, M.D.    Assessment/Plan:  57yo Male with history of lymphadema and recurrent cellulitis, presents with quick onset erythema/cellulitis to left leg without any new injury and leukocytosis. Started on cefazolin and clindamycin.  -cellulitis =  continue with cefazolin. Can discontinue clindamycin tomorrow if cellulitis continues to improve.  - screen for MRSA carriage to see if need to do mupirocin and chg baths - not completely evident to how this process happens suddenly for him without trauma and he becomes significantly ill. If erythema on lower extremity not improved over the next day or two may need to consider imaging for deep tissue infection.

## 2012-06-11 NOTE — Progress Notes (Signed)
Pt arrived to the floor via stretcher accompanied by ED staff. Transferred to the bed and admission assessment and history completed. Bed lowered to lowest position, wheels locked, and bed alarm turned on. Pt has no complaints of pain or shortness of breath. Will continue to assess pt periodically.   

## 2012-06-11 NOTE — Progress Notes (Signed)
[facility] PROGRESS NOTE  [name]. ZOX:[id number] DOB: Jul 11, 1955 DOA: 06/10/2012 PCP: Marga Melnick, MD  Brief narrative 57 year old male patient with history of eczema, HL, asthma, prior cellulitis in left lower extremity was admitted on 06/10/12 with complaints of subacute onset of fever, rigors followed by painful swelling of left leg with associated redness. His last episode LLE cellulitis was in June 2013. He denies any cuts or wounds to left leg. He was admitted for further evaluation and management.  Assessment/Plan: 1. Cellulitis of left lower extremity: Continue IV clindamycin and Ancef. Clinically improving. Infectious disease consulted. 2. Sepsis: Present on admission, secondary to problem #1. Management as above. Improving. 3. Leukocytosis: Secondary to infection. Improving.  4. Asthma: Stable. Continue Advair. 5. Hyperlipidemia: Continue statins.  Code Status: Full code  Family Communication: Discussed with patient  Disposition Plan: Home when medically stable   Consultants:  Infectious disease  Procedures:  None  Antibiotics:  IV clindamycin 2/21 >  IV Ancef 2/21 >   HPI/Subjective: Pain, swelling and redness of left leg is improving.   Objective: Filed Vitals:   06/10/12 2240 06/10/12 2307 06/11/12 0006 06/11/12 0606  BP: 110/66   100/62  Pulse: 86   87  Temp: 98.3 F (36.8 C)   99.2 F (37.3 C)  TempSrc: Oral   Oral  Resp: 18   18  Height:  5\' 5"  (1.651 m)    Weight: 74.4 kg (164 lb 0.4 oz)     SpO2: 95%  95% 93%    Intake/Output Summary (Last 24 hours) at 06/11/12 0740 Last data filed at 06/11/12 8119  Gross per 24 hour  Intake 833.33 ml  Output      0 ml  Net 833.33 ml   Filed Weights   06/10/12 2240  Weight: 74.4 kg (164 lb 0.4 oz)    Exam:   General exam: Comfortable.  Respiratory system: Clear. No increased work of breathing.  Cardiovascular system: S1 & S2 heard, RRR. No JVD, murmurs, gallops, clicks or  pedal edema.  Gastrointestinal system: Abdomen is nondistended, soft and nontender. Normal bowel sounds heard.  Central nervous system: Alert and oriented. No focal neurological deficits.  Extremities: Symmetric 5 x 5 power.Left leg with moderate swelling from the toes to the mid thigh with associated warmth and packed she fainted redness. Nontender. No crepitus or open wounds.   Data Reviewed: Basic Metabolic Panel:  Recent Labs Lab 06/10/12 1727 06/11/12 0555  NA 135 138  K 4.2 3.5  CL 97 103  CO2 25 26  GLUCOSE 128* 114*  BUN 11 10  CREATININE 0.91 0.82  CALCIUM 9.0 8.4   Liver Function Tests: No results found for this basename: AST, ALT, ALKPHOS, BILITOT, PROT, ALBUMIN,  in the last 168 hours No results found for this basename: LIPASE, AMYLASE,  in the last 168 hours No results found for this basename: AMMONIA,  in the last 168 hours CBC:  Recent Labs Lab 06/10/12 1727 06/11/12 0555  WBC 26.4* 15.8*  NEUTROABS 24.7*  --   HGB 15.2 13.1  HCT 43.7 37.5*  MCV 88.1 86.2  PLT 215 187   Cardiac Enzymes: No results found for this basename: CKTOTAL, CKMB, CKMBINDEX, TROPONINI,  in the last 168 hours BNP (last 3 results) No results found for this basename: PROBNP,  in the last 8760 hours CBG: No results found for this basename: GLUCAP,  in the last 168 hours  No results found for this or any previous visit (from  the past 240 hour(s)).   Studies: Dg Chest 2 View  06/10/2012  *RADIOLOGY REPORT*  Clinical Data: Fever, left leg cellulitis  CHEST - 2 VIEW  Comparison: 05/15/11  Findings: Cardiomediastinal silhouette is stable.  No acute infiltrate or pleural effusion.  No pulmonary edema.  Bony thorax is stable.  IMPRESSION: No active disease.  No significant change.   Original Report Authenticated By: Natasha Mead, M.D.      Additional labs:   Scheduled Meds: . sodium chloride   Intravenous STAT  .  ceFAZolin (ANCEF) IV  1 g Intravenous Q6H  . clindamycin (CLEOCIN)  IV  600 mg Intravenous Q6H  . heparin  5,000 Units Subcutaneous Q8H  . mometasone-formoterol  2 puff Inhalation BID  . simvastatin  10 mg Oral q1800   Continuous Infusions:   Active Problems:   Cellulitis of left leg   Sepsis    Time spent: 30 minutes    [facility]  [facility] Pager [phone].   If 8PM-8AM, please contact night-coverage at [email or web address], password [facility] 06/11/2012, 7:40 AM  LOS: 1 day

## 2012-06-12 LAB — GLUCOSE, CAPILLARY: Glucose-Capillary: 104 mg/dL — ABNORMAL HIGH (ref 70–99)

## 2012-06-12 LAB — URINE CULTURE

## 2012-06-12 NOTE — Progress Notes (Signed)
    [facility]    Date of Admission:  06/10/2012   Total days of antibiotics 3        Day 3 cefazolin        Day 3 clindamycin           ID: Gregory Phillips. is a 57 y.o. male with hx of recurrent cellulitis of left leg  Active Problems:   HYPERLIPIDEMIA   ASTHMA   Cellulitis of left leg   Sepsis    Subjective: Afebrile, improved erythema to left leg  Medications:  .  ceFAZolin (ANCEF) IV  2 g Intravenous Q8H  . clindamycin (CLEOCIN) IV  600 mg Intravenous Q6H  . heparin  5,000 Units Subcutaneous Q8H  . mometasone-formoterol  2 puff Inhalation BID  . simvastatin  10 mg Oral q1800    Objective: Vital signs in last 24 hours: Temp:  [98 F (36.7 C)-98.7 F (37.1 C)] 98 F (36.7 C) (03/23 1024) Pulse Rate:  [77-87] 77 (03/23 1024) Resp:  [18-20] 20 (03/23 1024) BP: (122-129)/(75-82) 129/82 mmHg (03/23 1024) SpO2:  [95 %-99 %] 96 % (03/23 1024)   Physical Exam  Constitutional: He is oriented to person, place, and time. He appears well-developed and well-nourished. No distress.  HENT:  Mouth/Throat: Oropharynx is clear and moist. No oropharyngeal exudate.  Cardiovascular: Normal rate, regular rhythm and normal heart sounds. Exam reveals no gallop and no friction rub.  No murmur heard.  Pulmonary/Chest: Effort normal and breath sounds normal. No respiratory distress. He has no wheezes.  Abdominal: Soft. Bowel sounds are normal. He exhibits no distension. There is no tenderness.  Lymphadenopathy:  He has no cervical adenopathy.  Neurological: He is alert and oriented to person, place, and time.  Skin: Skin is warm and dry. Left leg still has edema/swelling at baseline, improved from yesterday, erythema improved below knee   Lab Results  Recent Labs  06/10/12 1727 06/11/12 0555  WBC 26.4* 15.8*  HGB 15.2 13.1  HCT 43.7 37.5*  NA 135 138  K 4.2 3.5  CL 97 103  CO2 25 26  BUN 11 10  CREATININE 0.91 0.82    Microbiology: 3/21  blood cx NGTD 3/21 urine cx NGTD 3/22 mrsa pcr screen negative  Studies/Results: Dg Chest 2 View  06/10/2012  *RADIOLOGY REPORT*  Clinical Data: Fever, left leg cellulitis  CHEST - 2 VIEW  Comparison: 05/15/11  Findings: Cardiomediastinal silhouette is stable.  No acute infiltrate or pleural effusion.  No pulmonary edema.  Bony thorax is stable.  IMPRESSION: No active disease.  No significant change.   Original Report Authenticated By: Natasha Mead, M.D.      Assessment/Plan: Recurrent cellulitis = will stop clindamycin now. Continue with cefazolin, if continues to improve, can transition to keflex 500mg  QID tonight/tomorrow. Treat for total of 14 days.  - we will see back in ID clinic in 7-10 days  Will sign off. Call if questions  Drue Second [facility] Cell: [phone]   06/12/2012, 10:54 AM

## 2012-06-12 NOTE — Progress Notes (Signed)
[facility] PROGRESS NOTE  Gregory Phillips. ZOX:[id number] DOB: 04-Nov-1955 DOA: 06/10/2012 PCP: Marga Melnick, MD  Brief narrative 57 year old male patient with history of eczema, HL, asthma, prior cellulitis in left lower extremity was admitted on 06/10/12 with complaints of subacute onset of fever, rigors followed by painful swelling of left leg with associated redness. His last episode LLE cellulitis was in June 2013. He denies any cuts or wounds to left leg. He was admitted for further evaluation and management.  Assessment/Plan: 1. Cellulitis of left lower extremity: Was initially started on IV clindamycin and Ancef. Infectious disease consultation appreciated. Clinically improving. ID has discontinued clindamycin and recommend transitioning to Keflex to complete total of 14 days treatment and outpatient followup with them. 2. Sepsis: Present on admission, secondary to problem #1. Management as above. Improving. 3. Leukocytosis: Secondary to infection. Improving. Followup CBC in a.m. 4. Asthma: Stable. Continue Advair. 5. Hyperlipidemia: Continue statins.  Code Status: Full code  Family Communication: Discussed with patient  Disposition Plan: Home when medically stable-possibly 3/24   Consultants:  Infectious disease  Procedures:  None  Antibiotics:  IV clindamycin 3/21 > 3/23  IV Ancef 3/21 >   HPI/Subjective: Pain, swelling and redness of left leg continue to improve. Ambulating comfortably.   Objective: Filed Vitals:   06/11/12 1734 06/11/12 2143 06/12/12 1024 06/12/12 1342  BP: 122/77 122/75 129/82 125/81  Pulse: 79 84 77 81  Temp: 98.7 F (37.1 C) 98.4 F (36.9 C) 98 F (36.7 C) 98.4 F (36.9 C)  TempSrc: Oral Oral Oral Oral  Resp: 18 20 20 20   Height:      Weight:      SpO2: 95% 96% 96% 95%    Intake/Output Summary (Last 24 hours) at 06/12/12 1351 Last data filed at 06/12/12 1340  Gross per 24 hour  Intake    960 ml  Output      0 ml  Net     960 ml   Filed Weights   06/10/12 2240  Weight: 74.4 kg (164 lb 0.4 oz)    Exam:   General exam: Comfortable.  Respiratory system: Clear. No increased work of breathing.  Cardiovascular system: S1 & S2 heard, RRR. No JVD, murmurs, gallops, clicks or pedal edema.  Gastrointestinal system: Abdomen is nondistended, soft and nontender. Normal bowel sounds heard.  Central nervous system: Alert and oriented. No focal neurological deficits.  Extremities: Symmetric 5 x 5 power.Left leg with moderate swelling from the toes to the mid thigh with associated warmth and packed she fainted redness-all significantly improved. Nontender. No crepitus or open wounds.   Data Reviewed: Basic Metabolic Panel:  Recent Labs Lab 06/10/12 1727 06/11/12 0555  NA 135 138  K 4.2 3.5  CL 97 103  CO2 25 26  GLUCOSE 128* 114*  BUN 11 10  CREATININE 0.91 0.82  CALCIUM 9.0 8.4   Liver Function Tests: No results found for this basename: AST, ALT, ALKPHOS, BILITOT, PROT, ALBUMIN,  in the last 168 hours No results found for this basename: LIPASE, AMYLASE,  in the last 168 hours No results found for this basename: AMMONIA,  in the last 168 hours CBC:  Recent Labs Lab 06/10/12 1727 06/11/12 0555  WBC 26.4* 15.8*  NEUTROABS 24.7*  --   HGB 15.2 13.1  HCT 43.7 37.5*  MCV 88.1 86.2  PLT 215 187   Cardiac Enzymes: No results found for this basename: CKTOTAL, CKMB, CKMBINDEX, TROPONINI,  in the last 168 hours BNP (last 3  results) No results found for this basename: PROBNP,  in the last 8760 hours CBG:  Recent Labs Lab 06/11/12 0756 06/12/12 0737  GLUCAP 127* 104*    Recent Results (from the past 240 hour(s))  URINE CULTURE     Status: None   Collection Time    06/10/12  8:37 PM      Result Value Range Status   Specimen Description URINE, CLEAN CATCH   Final   Special Requests NONE   Final   Culture  Setup Time 06/10/2012 21:05   Final   Colony Count 15,000 COLONIES/ML   Final    Culture     Final   Value: Multiple bacterial morphotypes present, none predominant. Suggest appropriate recollection if clinically indicated.   Report Status 06/12/2012 FINAL   Final  CULTURE, BLOOD (ROUTINE X 2)     Status: None   Collection Time    06/10/12  8:55 PM      Result Value Range Status   Specimen Description BLOOD ARM LEFT   Final   Special Requests BOTTLES DRAWN AEROBIC AND ANAEROBIC 10CC   Final   Culture  Setup Time 06/11/2012 02:26   Final   Culture     Final   Value:        BLOOD CULTURE RECEIVED NO GROWTH TO DATE CULTURE WILL BE HELD FOR 5 DAYS BEFORE ISSUING A FINAL NEGATIVE REPORT   Report Status PENDING   Incomplete  CULTURE, BLOOD (ROUTINE X 2)     Status: None   Collection Time    06/10/12  9:04 PM      Result Value Range Status   Specimen Description BLOOD HAND LEFT   Final   Special Requests BOTTLES DRAWN AEROBIC AND ANAEROBIC 10CC   Final   Culture  Setup Time 06/11/2012 02:26   Final   Culture     Final   Value:        BLOOD CULTURE RECEIVED NO GROWTH TO DATE CULTURE WILL BE HELD FOR 5 DAYS BEFORE ISSUING A FINAL NEGATIVE REPORT   Report Status PENDING   Incomplete  MRSA PCR SCREENING     Status: None   Collection Time    06/11/12  3:55 PM      Result Value Range Status   MRSA by PCR NEGATIVE  NEGATIVE Final   Comment:            The GeneXpert MRSA Assay (FDA     approved for NASAL specimens     only), is one component of a     comprehensive MRSA colonization     surveillance program. It is not     intended to diagnose MRSA     infection nor to guide or     monitor treatment for     MRSA infections.     Studies: Dg Chest 2 View  06/10/2012  *RADIOLOGY REPORT*  Clinical Data: Fever, left leg cellulitis  CHEST - 2 VIEW  Comparison: 05/15/11  Findings: Cardiomediastinal silhouette is stable.  No acute infiltrate or pleural effusion.  No pulmonary edema.  Bony thorax is stable.  IMPRESSION: No active disease.  No significant change.   Original Report  Authenticated By: Natasha Mead, M.D.      Additional labs:   Scheduled Meds: .  ceFAZolin (ANCEF) IV  2 g Intravenous Q8H  . clindamycin (CLEOCIN) IV  600 mg Intravenous Q6H  . heparin  5,000 Units Subcutaneous Q8H  . mometasone-formoterol  2 puff Inhalation BID  .  simvastatin  10 mg Oral q1800   Continuous Infusions:   Active Problems:   HYPERLIPIDEMIA   ASTHMA   Cellulitis of left leg   Sepsis    Time spent: 30 minutes    [facility]  [facility] Pager [phone].   If 8PM-8AM, please contact night-coverage at [email or web address], password [facility] 06/12/2012, 1:51 PM  LOS: 2 days

## 2012-06-13 LAB — CBC
HCT: 39.4 % (ref 39.0–52.0)
Hemoglobin: 13.5 g/dL (ref 13.0–17.0)
MCV: 88.1 fL (ref 78.0–100.0)
RBC: 4.47 MIL/uL (ref 4.22–5.81)
RDW: 13.3 % (ref 11.5–15.5)
WBC: 8.7 10*3/uL (ref 4.0–10.5)

## 2012-06-13 LAB — GLUCOSE, CAPILLARY: Glucose-Capillary: 99 mg/dL (ref 70–99)

## 2012-06-13 MED ORDER — CEPHALEXIN 500 MG PO CAPS: 500.0000 mg | ORAL_CAPSULE | Freq: Four times a day (QID) | ORAL | Status: DC

## 2012-06-13 NOTE — Progress Notes (Signed)
Discussed discharge instructions with pt. Pt showed no barriers to discharge. IV removed. Medications discussed. Pt requested a list of the antibiotics he was treated with this admission - this was given to pt - because this is a recurrent problem. Assessment unchanged from morning. Pt discharged to home with wife.

## 2012-06-13 NOTE — Discharge Summary (Signed)
Physician Discharge Summary  Gregory Phillips. ZOX:[id number] DOB: 12-02-1955 DOA: 06/10/2012  PCP: Marga Melnick, MD  Admit date: 06/10/2012 Discharge date: 06/13/2012  Time spent: Less than 30 minutes  Recommendations for Outpatient Follow-up:  1. With Dr. Marga Melnick, PCP 2. With Dr. Judyann Munson, Infectious Disease M.D. in 1 week  Discharge Diagnoses:  Active Problems:   HYPERLIPIDEMIA   ASTHMA   Cellulitis of left leg   Sepsis   Discharge Condition: Improved & Stable  Diet recommendation: Heart healthy  Filed Weights   06/10/12 2240 06/12/12 2020  Weight: 74.4 kg (164 lb 0.4 oz) 73.4 kg (161 lb 13.1 oz)    History of present illness:  57 year old male with history of seminoma, status post right radical orchiectomy, eczema, chronic unilateral left leg lymphedema, history of recurrent episodes of left leg cellulitis was admitted on 06/10/12 with acute onset of fever, rigors, dry heaves, left inguinal lymphadenopathy and abrupt onset of erythema and swelling of left leg from toes to above the knee. He denied history of trauma. In the ED, temperature 100.39F and leukocytosis of 26.4 with left shift. He was admitted for further evaluation and management.  Hospital Course:  1. Left leg cellulitis, recurrent: In the context of chronic lymphedema. Patient was initially started on IV cefazolin and clindamycin. Infectious disease was consulted. Patient clinically improved. Clindamycin was discontinued. ID recommends changing to by mouth Keflex and complete total 14 days of antibiotics and followup with them in 7-10 days from discharge. Patient indicates that his leg is back to baseline. Blood cultures x2 are negative to date. 2. Sepsis, present on admission: Secondary to problem #1. Resolved. 3. Leukocytosis: Secondary to cellulitis. Resolved. 4. Asthma: Stable. 5. Hyperlipidemia: Continue home statins.   Procedures:  None   Consultations:  Infectious  disease  Discharge Exam:  Complaints: Patient denies complaints. He indicates that his left leg is back to baseline. The leg does have chronic swelling but he denies pain or worsening redness at this time.  Filed Vitals:   06/12/12 2106 06/13/12 0527 06/13/12 0845 06/13/12 0940  BP:  115/67  114/69  Pulse:  66  82  Temp:  97.7 F (36.5 C)  97.9 F (36.6 C)  TempSrc:  Oral  Oral  Resp:  18  18  Height:      Weight:      SpO2: 96% 97% 97% 95%     General exam: Comfortable.  Respiratory system: Clear. No increased work of breathing.  Cardiovascular system: S1 and S2 heard, RRR. No JVD, murmurs or pedal edema.  Gastrointestinal system: Abdomen is nondistended, soft and nontender. Normal bowel sounds heard.  Central nervous system: Alert and oriented. No focal neurological deficits.  Extremities: Symmetric 5 x 5 power. Left leg with chronic swelling to the knee, minimal increased warmth compared to right leg, faint patchy redness but much improved compared to admission. Nontender.  Discharge Instructions      Discharge Orders   Future Appointments Provider Department Dept Phone   07/22/2012 10:30 AM Pecola Lawless, MD [facility]  [place] [place] [phone]   Future Orders Complete By Expires     Call MD for:  redness, tenderness, or signs of infection (pain, swelling, redness, odor or green/yellow discharge around incision site)  As directed     Call MD for:  severe uncontrolled pain  As directed     Call MD for:  temperature >100.4  As directed     Diet - low sodium heart healthy  As directed     Increase activity slowly  As directed         Medication List    TAKE these medications       betamethasone dipropionate 0.05 % ointment  Commonly known as:  DIPROLENE  Apply 1 application topically at bedtime.     cephALEXin 500 MG capsule  Commonly known as:  KEFLEX  Take 1 capsule (500 mg total) by mouth 4 (four) times daily.     desonide 0.05 %  ointment  Commonly known as:  DESOWEN  Apply 1 application topically at bedtime.     Fluticasone-Salmeterol 100-50 MCG/DOSE Aepb  Commonly known as:  ADVAIR  Inhale 1 puff into the lungs 2 (two) times daily.     pravastatin 20 MG tablet  Commonly known as:  PRAVACHOL  TAKE 1 TABLET BY MOUTH ONCE DAILY     VIAGRA 100 MG tablet  Generic drug:  sildenafil  TAKE 1 TABLET BY MOUTH AS NEEDED FOR ERECTILE DYSFUNCTION       Follow-up Information   Schedule an appointment as soon as possible for a visit with Marga Melnick, MD.   Contact information:   [phone] [facility] [address] [place] [address] [phone]       Follow up with Judyann Munson, MD. Schedule an appointment as soon as possible for a visit in 1 week.   [address] [place] [address] [phone]        The results of significant diagnostics from this hospitalization (including imaging, microbiology, ancillary and laboratory) are listed below for reference.    Significant Diagnostic Studies: Dg Chest 2 View  06/10/2012  *RADIOLOGY REPORT*  Clinical Data: Fever, left leg cellulitis  CHEST - 2 VIEW  Comparison: 05/15/11  Findings: Cardiomediastinal silhouette is stable.  No acute infiltrate or pleural effusion.  No pulmonary edema.  Bony thorax is stable.  IMPRESSION: No active disease.  No significant change.   Original Report Authenticated By: Natasha Mead, M.D.     Microbiology: Recent Results (from the past 240 hour(s))  URINE CULTURE     Status: None   Collection Time    06/10/12  8:37 PM      Result Value Range Status   Specimen Description URINE, CLEAN CATCH   Final   Special Requests NONE   Final   Culture  Setup Time 06/10/2012 21:05   Final   Colony Count 15,000 COLONIES/ML   Final   Culture     Final   Value: Multiple bacterial morphotypes present, none predominant. Suggest appropriate recollection if clinically indicated.   Report Status  06/12/2012 FINAL   Final  CULTURE, BLOOD (ROUTINE X 2)     Status: None   Collection Time    06/10/12  8:55 PM      Result Value Range Status   Specimen Description BLOOD ARM LEFT   Final   Special Requests BOTTLES DRAWN AEROBIC AND ANAEROBIC 10CC   Final   Culture  Setup Time 06/11/2012 02:26   Final   Culture     Final   Value:        BLOOD CULTURE RECEIVED NO GROWTH TO DATE CULTURE WILL BE HELD FOR 5 DAYS BEFORE ISSUING A FINAL NEGATIVE REPORT   Report Status PENDING   Incomplete  CULTURE, BLOOD (ROUTINE X 2)     Status: None   Collection Time    06/10/12  9:04 PM  Result Value Range Status   Specimen Description BLOOD HAND LEFT   Final   Special Requests BOTTLES DRAWN AEROBIC AND ANAEROBIC 10CC   Final   Culture  Setup Time 06/11/2012 02:26   Final   Culture     Final   Value:        BLOOD CULTURE RECEIVED NO GROWTH TO DATE CULTURE WILL BE HELD FOR 5 DAYS BEFORE ISSUING A FINAL NEGATIVE REPORT   Report Status PENDING   Incomplete  MRSA PCR SCREENING     Status: None   Collection Time    06/11/12  3:55 PM      Result Value Range Status   MRSA by PCR NEGATIVE  NEGATIVE Final   Comment:            The GeneXpert MRSA Assay (FDA     approved for NASAL specimens     only), is one component of a     comprehensive MRSA colonization     surveillance program. It is not     intended to diagnose MRSA     infection nor to guide or     monitor treatment for     MRSA infections.     Labs: Basic Metabolic Panel:  Recent Labs Lab 06/10/12 1727 06/11/12 0555  NA 135 138  K 4.2 3.5  CL 97 103  CO2 25 26  GLUCOSE 128* 114*  BUN 11 10  CREATININE 0.91 0.82  CALCIUM 9.0 8.4   Liver Function Tests: No results found for this basename: AST, ALT, ALKPHOS, BILITOT, PROT, ALBUMIN,  in the last 168 hours No results found for this basename: LIPASE, AMYLASE,  in the last 168 hours No results found for this basename: AMMONIA,  in the last 168 hours CBC:  Recent Labs Lab  06/10/12 1727 06/11/12 0555 06/13/12 0640  WBC 26.4* 15.8* 8.7  NEUTROABS 24.7*  --   --   HGB 15.2 13.1 13.5  HCT 43.7 37.5* 39.4  MCV 88.1 86.2 88.1  PLT 215 187 211   Cardiac Enzymes: No results found for this basename: CKTOTAL, CKMB, CKMBINDEX, TROPONINI,  in the last 168 hours BNP: BNP (last 3 results) No results found for this basename: PROBNP,  in the last 8760 hours CBG:  Recent Labs Lab 06/11/12 0756 06/12/12 0737 06/13/12 0739  GLUCAP 127* 104* 99    Additional labs:    Signed:  Alyn Riedinger  [facility] 06/13/2012, 10:31 AM

## 2012-06-17 LAB — CULTURE, BLOOD (ROUTINE X 2)
Culture: NO GROWTH
Culture: NO GROWTH

## 2012-06-23 ENCOUNTER — Inpatient Hospital Stay: Payer: 59 | Admitting: Internal Medicine

## 2012-06-28 ENCOUNTER — Inpatient Hospital Stay: Payer: 59 | Admitting: Internal Medicine

## 2012-06-29 DIAGNOSIS — L309 Dermatitis, unspecified: Secondary | ICD-10-CM | POA: Insufficient documentation

## 2012-07-14 ENCOUNTER — Inpatient Hospital Stay: Payer: 59 | Admitting: Internal Medicine

## 2012-07-21 ENCOUNTER — Encounter: Payer: Self-pay | Admitting: Internal Medicine

## 2012-07-21 ENCOUNTER — Ambulatory Visit (INDEPENDENT_AMBULATORY_CARE_PROVIDER_SITE_OTHER): Payer: 59 | Admitting: Internal Medicine

## 2012-07-21 VITALS — BP 132/87 | HR 71 | Temp 97.6°F | Wt 163.0 lb

## 2012-07-21 DIAGNOSIS — L0390 Cellulitis, unspecified: Secondary | ICD-10-CM

## 2012-07-21 DIAGNOSIS — L0291 Cutaneous abscess, unspecified: Secondary | ICD-10-CM

## 2012-07-21 MED ORDER — CEPHALEXIN 500 MG PO CAPS: 500.0000 mg | ORAL_CAPSULE | Freq: Four times a day (QID) | ORAL | Status: DC

## 2012-07-21 NOTE — Progress Notes (Signed)
[facility]   RFV: hospital follow up for recurrent cellulitis Subjective:    Patient ID: Gregory Phillips., male    DOB: 08-04-1955, 57 y.o.   MRN: [id number]  HPI Gregory Phillips is a 57yo M with hx of seminoma, and few medical problems including hyperlipidemia and eczema. He was hospitalized roughly 1 year ago and then again last month for cellulitis of his lower extremity. It has quick onset and rapidly progress to SIRS like scenario. Since his last hospitalization, he has been doing well. Treated with cefazolin as inpatient and transitioned to cephalexin for oral therapy. No site of injury as cause of onset. He does have dry skin from eczema which has not worsened. No recurrent cellulitis after hospitalization. Went to [employer] recently and did well.  Current Outpatient Prescriptions on File Prior to Visit  Medication Sig Dispense Refill  . betamethasone dipropionate (DIPROLENE) 0.05 % ointment Apply 1 application topically at bedtime.       . Fluticasone-Salmeterol (ADVAIR) 100-50 MCG/DOSE AEPB Inhale 1 puff into the lungs 2 (two) times daily.      Marland Kitchen VIAGRA 100 MG tablet TAKE 1 TABLET BY MOUTH AS NEEDED FOR ERECTILE DYSFUNCTION  12 tablet  0  . desonide (DESOWEN) 0.05 % ointment Apply 1 application topically at bedtime.        No current facility-administered medications on file prior to visit.   Active Ambulatory Problems    Diagnosis Date Noted  . SEMINOMA 03/21/2007  . HYPERLIPIDEMIA 03/21/2007  . ASTHMA 03/28/2009  . ECZEMA, ATOPIC 03/21/2007  . COLONIC POLYPS, HX OF 03/28/2009  . Cellulitis of left leg 09/01/2011   Resolved Ambulatory Problems    Diagnosis Date Noted  . ARTHROSCOPY, RIGHT KNEE, HX OF 03/21/2007  . DIZZINESS 03/05/2010  . Headache 03/05/2010  . Sepsis 06/10/2012   Past Medical History  Diagnosis Date  . Hyperlipidemia   . Asthma   . Eczema   . Cellulitis 2001  . Cellulitis 2007  . Edema   . Cancer 2004  . Cellulitis 2014   History   Substance Use Topics  . Smoking status: Never Smoker   . Smokeless tobacco: Never Used  . Alcohol Use: 4.2 oz/week    7 Cans of beer per week     Comment: occasionally  family history includes Asthma in his sister; Cancer in his paternal grandfather; Colon cancer in his father; Eczema in his maternal uncle; Heart disease in his father; Liver cancer in his father; and Transient ischemic attack in his mother.  There is no history of Diabetes.  Review of Systems  Constitutional: Negative for fever, chills, diaphoresis, activity change, appetite change, fatigue and unexpected weight change.  HENT: Negative for congestion, sore throat, rhinorrhea, sneezing, trouble swallowing and sinus pressure.  Eyes: Negative for photophobia and visual disturbance.  Respiratory: Negative for cough, chest tightness, shortness of breath, wheezing and stridor.  Cardiovascular: Negative for chest pain, palpitations and leg swelling.  Gastrointestinal: Negative for nausea, vomiting, abdominal pain, diarrhea, constipation, blood in stool, abdominal distention and anal bleeding.  Genitourinary: Negative for dysuria, hematuria, flank pain and difficulty urinating.  Musculoskeletal: Negative for myalgias, back pain, joint swelling, arthralgias and gait problem.  Skin: Negative for color change, pallor, rash and wound.  Neurological: Negative for dizziness, tremors, weakness and light-headedness.  Hematological: Negative for adenopathy. Does not bruise/bleed easily.  Psychiatric/Behavioral: Negative for behavioral problems, confusion, sleep disturbance, dysphoric mood, decreased concentration and agitation.       Objective:  Physical Exam BP 132/87  Pulse 71  Temp(Src) 97.6 F (36.4 C) (Oral)  Wt 163 lb (73.936 kg)  BMI 27.12 kg/m2 Physical Exam  Constitutional: He is oriented to person, place, and time. He appears well-developed and well-nourished. No distress.  HENT:  Mouth/Throat: Oropharynx is clear and  moist. No oropharyngeal exudate.  Cardiovascular: Normal rate, regular rhythm and normal heart sounds. Exam reveals no gallop and no friction rub.  No murmur heard.  Pulmonary/Chest: Effort normal and breath sounds normal. No respiratory distress. He has no wheezes.  Abdominal: Soft. Bowel sounds are normal. He exhibits no distension. There is no tenderness.  Lymphadenopathy:  He has no cervical adenopathy.  Neurological: He is alert and oriented to person, place, and time.  Skin: Skin is warm and dry. No rash noted. No erythema. Left leg looks well. Wears compression stalking Psychiatric: He has a normal mood and affect. His behavior is normal.        Assessment & Plan:   cellulitis : will give rx  For compression stalking and give keflex as needed for next episode with caveat to call us when he initiates antibiotics. Consider getting vascular studies but not clear if that would help [place] determine why he has recurrent episodes   rtc prn

## 2012-07-22 ENCOUNTER — Encounter: Payer: Self-pay | Admitting: Internal Medicine

## 2012-07-22 ENCOUNTER — Ambulatory Visit (INDEPENDENT_AMBULATORY_CARE_PROVIDER_SITE_OTHER): Payer: 59 | Admitting: Internal Medicine

## 2012-07-22 VITALS — BP 124/72 | HR 80 | Temp 98.2°F | Resp 12 | Ht 66.0 in | Wt 164.0 lb

## 2012-07-22 DIAGNOSIS — E785 Hyperlipidemia, unspecified: Secondary | ICD-10-CM

## 2012-07-22 DIAGNOSIS — Z0000 Encounter for general adult medical examination without abnormal findings: Secondary | ICD-10-CM

## 2012-07-22 LAB — HEMOGLOBIN A1C: Hgb A1c MFr Bld: 5.6 % (ref 4.6–6.5)

## 2012-07-22 NOTE — Progress Notes (Signed)
  Subjective:    Patient ID: Gregory Rattler., male    DOB: 07/11/1955, 57 y.o.   MRN: [id number]  HPI   Gregory Phillips is here for a physical; he denies acute issues .  He was recently hospitalized for 3 days with cellulitis associated with sepsis for which he received parenteral antibiotics. He completed a 14 day course of oral cephalexin. At this time he denies any fever, chills, or pustules.     Review of Systems He is not on a heart healthy diet; he exercises as walking 60 minutes 1 time per week without symptoms. Specifically he denies chest pain, palpitations, dyspnea, or claudication. Family history is negative for premature coronary disease. Advanced cholesterol testing reveals his LDL goal was less than 100, ideally < 75.    Objective:   Physical Exam Gen.:  well-nourished in appearance. Alert, appropriate and cooperative throughout exam. Head: Normocephalic without obvious abnormalities;  no alopecia  Eyes: No corneal or conjunctival inflammation noted.  Extraocular motion intact. Vision grossly normal without lenses Ears: External  ear exam reveals no significant lesions or deformities. Canals clear .TMs normal. Hearing is grossly normal bilaterally. Nose: External nasal exam reveals no deformity or inflammation. Nasal mucosa are pink and moist. No lesions or exudates noted.  Mouth: Oral mucosa and oropharynx reveal no lesions or exudates. Teeth in excellent repair. Neck: No deformities, masses, or tenderness noted. Range of motion & Thyroid normal. Lungs: Normal respiratory effort; chest expands symmetrically. Lungs are clear to auscultation without rales, wheezes, or increased work of breathing. Heart: Normal rate and rhythm. Normal S1 and S2. No gallop, click, or rub. Grade 1/6 systolic murmur Abdomen: Bowel sounds normal; abdomen soft and nontender. No masses, organomegaly or hernias noted. Genitalia: Genitalia normal except for absent R testicle & left varices. Prostate could not be  assessed due to increased rectal tone; significant external hemorrhoidal tissue.                 Musculoskeletal/extremities: No deformity or scoliosis noted of  the thoracic or lumbar spine.  No clubbing, cyanosis,  or significant extremity  deformity noted. Range of motion normal .Tone & strength  Normal. Trace -1/2+ edema L foot/ankle. Joints normal / reveal mild  DJD DIP changes. Fingernails short.  Able to lie down & sit up w/o help. Negative SLR bilaterally Vascular: Carotid, radial artery,  and  posterior tibial pulses are full and equal. DPP slightly decreased.No bruits present. Neurologic: Alert and oriented x3. Deep tendon reflexes symmetrical and normal.     Skin: Diffuse eczematoid lesions Lymph: No cervical, axillary, or inguinal lymphadenopathy present. Psych: Mood and affect are normal. Normally interactive                                                                                        Assessment & Plan:  #1 comprehensive physical exam; no acute findings  Plan: see Orders  & Recommendations

## 2012-07-22 NOTE — Patient Instructions (Addendum)
Review and correct the record as indicated. Please share record with all medical staff seen.  

## 2012-07-25 LAB — LIPID PANEL
Cholesterol: 228 mg/dL — ABNORMAL HIGH (ref 0–200)
HDL: 71.6 mg/dL (ref >39.00)
Triglycerides: 82 mg/dL (ref 0.0–149.0)

## 2012-07-26 ENCOUNTER — Telehealth: Payer: Self-pay | Admitting: General Practice

## 2012-07-26 DIAGNOSIS — T887XXA Unspecified adverse effect of drug or medicament, initial encounter: Secondary | ICD-10-CM

## 2012-07-26 DIAGNOSIS — E785 Hyperlipidemia, unspecified: Secondary | ICD-10-CM

## 2012-07-26 MED ORDER — SIMVASTATIN 20 MG PO TABS: 20.0000 mg | ORAL_TABLET | Freq: Every day | ORAL | Status: DC

## 2012-07-26 NOTE — Telephone Encounter (Signed)
Message copied by Jackson Latino on Tue Jul 26, 2012  2:03 PM ------      Message from: Pecola Lawless      Created: Tue Jul 26, 2012 12:48 PM       Please send a prescription for simvastatin 20 mg qhs IN PLACE OF pravastatin 20; dispense 90.      Please  schedule fasting Labs 10-11 weeks later : CK,Lipids, hepatic panel. Codes: 272.4,995.20. ------

## 2012-07-26 NOTE — Telephone Encounter (Signed)
Med filled,orders place,and chart updated.

## 2012-10-17 ENCOUNTER — Other Ambulatory Visit: Payer: Self-pay | Admitting: Internal Medicine

## 2013-01-26 ENCOUNTER — Other Ambulatory Visit: Payer: Self-pay

## 2013-02-10 ENCOUNTER — Other Ambulatory Visit: Payer: Self-pay | Admitting: Internal Medicine

## 2013-02-10 NOTE — Telephone Encounter (Signed)
Patient is requesting refill for Ambien  Last seen-07/22/2012  Last filled-10/17/2012  UDS-no UDS or contract on file  Please advise. SW

## 2013-02-10 NOTE — Telephone Encounter (Signed)
OK #10 

## 2013-08-08 ENCOUNTER — Ambulatory Visit (INDEPENDENT_AMBULATORY_CARE_PROVIDER_SITE_OTHER): Payer: 59 | Admitting: Internal Medicine

## 2013-08-08 ENCOUNTER — Other Ambulatory Visit (INDEPENDENT_AMBULATORY_CARE_PROVIDER_SITE_OTHER): Payer: 59

## 2013-08-08 ENCOUNTER — Encounter: Payer: Self-pay | Admitting: Internal Medicine

## 2013-08-08 VITALS — BP 128/92 | HR 81 | Temp 98.0°F | Ht 65.0 in | Wt 165.8 lb

## 2013-08-08 DIAGNOSIS — R7309 Other abnormal glucose: Secondary | ICD-10-CM

## 2013-08-08 DIAGNOSIS — L309 Dermatitis, unspecified: Secondary | ICD-10-CM

## 2013-08-08 DIAGNOSIS — Z0000 Encounter for general adult medical examination without abnormal findings: Secondary | ICD-10-CM

## 2013-08-08 DIAGNOSIS — L259 Unspecified contact dermatitis, unspecified cause: Secondary | ICD-10-CM

## 2013-08-08 DIAGNOSIS — E782 Mixed hyperlipidemia: Secondary | ICD-10-CM

## 2013-08-08 DIAGNOSIS — K649 Unspecified hemorrhoids: Secondary | ICD-10-CM

## 2013-08-08 LAB — BASIC METABOLIC PANEL
BUN: 11 mg/dL (ref 6–23)
CALCIUM: 9.3 mg/dL (ref 8.4–10.5)
CO2: 29 mEq/L (ref 19–32)
CREATININE: 0.8 mg/dL (ref 0.4–1.5)
Chloride: 100 mEq/L (ref 96–112)
GFR: 111.17 mL/min (ref >60.00)
Glucose, Bld: 105 mg/dL — ABNORMAL HIGH (ref 70–99)
Potassium: 4.5 mEq/L (ref 3.5–5.1)
Sodium: 136 mEq/L (ref 135–145)

## 2013-08-08 LAB — CBC WITH DIFFERENTIAL/PLATELET
BASOS PCT: 0.6 % (ref 0.0–3.0)
Basophils Absolute: 0.1 10*3/uL (ref 0.0–0.1)
EOS ABS: 0.4 10*3/uL (ref 0.0–0.7)
EOS PCT: 3.6 % (ref 0.0–5.0)
HCT: 45.5 % (ref 39.0–52.0)
Hemoglobin: 15.6 g/dL (ref 13.0–17.0)
LYMPHS PCT: 11.2 % — AB (ref 12.0–46.0)
Lymphs Abs: 1.1 10*3/uL (ref 0.7–4.0)
MCHC: 34.3 g/dL (ref 30.0–36.0)
MCV: 89.4 fl (ref 78.0–100.0)
Monocytes Absolute: 0.8 10*3/uL (ref 0.1–1.0)
Monocytes Relative: 7.9 % (ref 3.0–12.0)
NEUTROS PCT: 76.7 % (ref 43.0–77.0)
Neutro Abs: 7.7 10*3/uL (ref 1.4–7.7)
Platelets: 241 10*3/uL (ref 150.0–400.0)
RBC: 5.09 Mil/uL (ref 4.22–5.81)
RDW: 13.6 % (ref 11.5–15.5)
WBC: 10 10*3/uL (ref 4.0–10.5)

## 2013-08-08 LAB — LIPID PANEL
Cholesterol: 222 mg/dL — ABNORMAL HIGH (ref 0–200)
HDL: 70 mg/dL (ref >39.00)
LDL Cholesterol: 136 mg/dL — ABNORMAL HIGH (ref 0–99)
Total CHOL/HDL Ratio: 3
Triglycerides: 82 mg/dL (ref 0.0–149.0)
VLDL: 16.4 mg/dL (ref 0.0–40.0)

## 2013-08-08 LAB — TSH: TSH: 1.52 u[IU]/mL (ref 0.35–4.50)

## 2013-08-08 LAB — HEPATIC FUNCTION PANEL
ALK PHOS: 51 U/L (ref 39–117)
ALT: 28 U/L (ref 0–53)
AST: 24 U/L (ref 0–37)
Albumin: 4.1 g/dL (ref 3.5–5.2)
BILIRUBIN DIRECT: 0.2 mg/dL (ref 0.0–0.3)
TOTAL PROTEIN: 7.6 g/dL (ref 6.0–8.3)
Total Bilirubin: 1.1 mg/dL (ref 0.2–1.2)

## 2013-08-08 MED ORDER — BETAMETHASONE DIPROPIONATE 0.05 % EX OINT: 1.0000 "application " | TOPICAL_OINTMENT | Freq: Every day | CUTANEOUS | Status: DC

## 2013-08-08 MED ORDER — DESONIDE 0.05 % EX OINT: 1.0000 "application " | TOPICAL_OINTMENT | Freq: Every day | CUTANEOUS | Status: DC

## 2013-08-08 NOTE — Patient Instructions (Signed)
Your next office appointment will be determined based upon review of your pending labs. Those instructions will be transmitted to you through My Chart . 

## 2013-08-08 NOTE — Progress Notes (Signed)
Subjective:    Patient ID: Gregory Hard., male    DOB: November 07, 1955, 58 y.o.   MRN: [id number]  HPI  He is here for a physical;acute issues denied.  A heart healthy diet is not followed; exercise encompasses 60 minutes 2  times per week as  walking without symptoms.  Family history is negative for premature coronary disease. Advanced cholesterol testing reveals  LDL goal is less than 100; ideally < 70 . There is medication non compliance with the statin. He stopped it 6 months ago. "I'm not regimented". Low dose ASA not taken      Review of Systems Specifically denied are  chest pain, palpitations, dyspnea, or claudication.      Objective:   Physical Exam Gen.: well-nourished in appearance. Alert, appropriate and cooperative throughout exam. Appears younger than stated age  Head: Normocephalic without obvious abnormalities;no alopecia  Eyes: No corneal or conjunctival inflammation noted. Pupils equal round reactive to light and accommodation. Extraocular motion intact.  Ears: External  ear exam reveals no significant lesions or deformities. Canals clear .TMs normal. Hearing is grossly normal bilaterally. Nose: External nasal exam reveals no deformity or inflammation. Nasal mucosa are pink and moist. No lesions or exudates noted.   Mouth: Oral mucosa and oropharynx reveal no lesions or exudates. Teeth in good repair. Neck: No deformities, masses, or tenderness noted. Range of motion &Thyroid normal. Lungs: Normal respiratory effort; chest expands symmetrically. Lungs are clear to auscultation without rales, wheezes, or increased work of breathing. Heart: Normal rate and rhythm. Normal S1 and S2. No gallop, click, or rub. No murmur. Abdomen: Bowel sounds normal; abdomen soft and nontender. No masses, organomegaly or hernias noted. Genitalia: Genitalia normal except for left varices and absent right testicle. Significant external hemorrhoids present. Prostate is normal without  enlargement, asymmetry, nodularity, or induration    Musculoskeletal/extremities: No deformity or scoliosis noted of  the thoracic or lumbar spine.  No clubbing, cyanosis, edema, or significant extremity  deformity noted. Range of motion normal .Tone & strength normal. Hand joints normal  Fingernails deformed. Able to lie down & sit up w/o help. Negative SLR bilaterally Vascular: Carotid, radial artery, dorsalis pedis and  posterior tibial pulses are full and equal. No bruits present. Neurologic: Alert and oriented x3. Deep tendon reflexes symmetrical and normal.  Gait normal  . Skin: Diffuse eczematoid rash and erythema involving trunk and extremities and to lesser extent the face. Lymph: No cervical, axillary, or inguinal lymphadenopathy present. Psych: Mood and affect are normal. Normally interactive                                                                                       Assessment & Plan:  #1 comprehensive physical exam; no acute findings  Plan: see Orders  & Recommendations

## 2013-08-08 NOTE — Progress Notes (Signed)
Pre visit review using our clinic review tool, if applicable. No additional management support is needed unless otherwise documented below in the visit note. 

## 2013-08-13 DIAGNOSIS — R7309 Other abnormal glucose: Secondary | ICD-10-CM | POA: Insufficient documentation

## 2013-09-14 ENCOUNTER — Encounter: Payer: Self-pay | Admitting: Internal Medicine

## 2013-09-14 ENCOUNTER — Telehealth: Payer: Self-pay

## 2013-09-14 NOTE — Telephone Encounter (Signed)
Letter has been mailed.

## 2013-09-14 NOTE — Telephone Encounter (Signed)
Message copied by Shelly Coss on Thu Sep 14, 2013  9:45 AM ------      Message from: Hendricks Limes      Created: Thu Sep 14, 2013  9:33 AM       Please mail ltter ------

## 2013-09-24 ENCOUNTER — Other Ambulatory Visit: Payer: Self-pay | Admitting: Internal Medicine

## 2013-09-25 NOTE — Telephone Encounter (Signed)
Last office visit 08-08-13

## 2013-09-25 NOTE — Telephone Encounter (Signed)
OK X1 

## 2013-10-09 ENCOUNTER — Other Ambulatory Visit: Payer: Self-pay | Admitting: Internal Medicine

## 2013-10-09 NOTE — Telephone Encounter (Signed)
OK , R X1 

## 2013-11-25 ENCOUNTER — Encounter (HOSPITAL_COMMUNITY): Payer: Self-pay | Admitting: Emergency Medicine

## 2013-11-25 ENCOUNTER — Emergency Department (HOSPITAL_COMMUNITY)
Admission: EM | Admit: 2013-11-25 | Discharge: 2013-11-25 | Disposition: A | Discharge status: Home / Self Care | Payer: 59 | Source: Home / Self Care | Attending: Family Medicine | Admitting: Family Medicine

## 2013-11-25 DIAGNOSIS — H6122 Impacted cerumen, left ear: Secondary | ICD-10-CM

## 2013-11-25 DIAGNOSIS — H6120 Impacted cerumen, unspecified ear: Secondary | ICD-10-CM

## 2013-11-25 NOTE — ED Provider Notes (Signed)
CSN: [id number]     Arrival date & time 11/25/13  1221 History   First MD Initiated Contact with Patient 11/25/13 1249     Chief Complaint  Patient presents with  . Otalgia   (Consider location/radiation/quality/duration/timing/severity/associated sxs/prior Treatment) HPI Comments: Patient presents with left ear discomfort and pressure x 1 week. Denies changes in hearing and endorses he has had scant watery drainage from left ear canal. Has tried cleaning ear with OTC products with no relief. Denies fever.   Patient is a 58 y.o. male presenting with ear pain. The history is provided by the patient.  Otalgia Location:  Left   Past Medical History  Diagnosis Date  . Hyperlipidemia   . Asthma   . Eczema   . Cellulitis 2001     LLE ; [facility]  . Cellulitis 2007    R elbow ; S/ P drainage by Dr Telford Nab  . Edema   . Cancer 2004    hx of testicular  cancer; Dr Jeffie Pollock  . Cellulitis 2014    with sepsis   Past Surgical History  Procedure Laterality Date  . Seminoma  resection  2005     Dr Jeffie Pollock  . Arthroscopy of knee       X 2 Dr Noemi Chapel; 2 nd surgery for patellar instability  . Refractive surgery  2004  . Colonoscopy w/ polypectomy  05/14/2008 & 04/2012    Tubular adenoma; Dr. Verl Blalock  . I&d extremity  2004    elbow   Family History  Problem Relation Age of Onset  . Heart disease Father     dysrrhymthmia  . Colon cancer Father   . Liver cancer Father   . Transient ischemic attack Mother     in 53s  . Asthma Sister   . Cancer Paternal Grandfather     stomach  . Eczema Maternal Uncle   . Diabetes Neg Hx    History  Substance Use Topics  . Smoking status: Never Smoker   . Smokeless tobacco: Never Used  . Alcohol Use: 5.4 oz/week    9 Cans of beer per week     Comment: occasionally    Review of Systems  HENT: Positive for ear pain.   All other systems reviewed and are negative.   Allergies  Review of patient's allergies indicates no known  allergies.  Home Medications   Prior to Admission medications   Medication Sig Start Date End Date Taking? Authorizing Provider  betamethasone dipropionate (DIPROLENE) 0.05 % ointment Apply 1 application topically at bedtime. 08/08/13   Hendricks Limes, MD  desonide (DESOWEN) 0.05 % ointment Apply 1 application topically at bedtime. 08/08/13   Hendricks Limes, MD  Fluticasone-Salmeterol (ADVAIR) 100-50 MCG/DOSE AEPB Inhale 1 puff into the lungs 2 (two) times daily.    Hendricks Limes, MD  VIAGRA 100 MG tablet TAKE ONE TABLET BY MOUTH AS NEEDED FOR ERECTILE DYSFUNCTION. 10/09/13   Hendricks Limes, MD  zolpidem (AMBIEN) 5 MG tablet TAKE 1 TABLET BY MOUTH EVERY THIRD NIGHT AS NEEDED ONLY    Hendricks Limes, MD   BP 144/84  Pulse 63  Temp(Src) 98.2 F (36.8 C) (Oral)  Resp 16  SpO2 96% Physical Exam  Nursing note and vitals reviewed. Constitutional: He is oriented to person, place, and time. He appears well-developed and well-nourished. No distress.  HENT:  Head: Normocephalic and atraumatic.  Right Ear: Hearing, tympanic membrane, external ear and ear canal normal.  Left Ear:  Hearing and tympanic membrane normal.  Nose: Nose normal.  Mouth/Throat: Uvula is midline, oropharynx is clear and moist and mucous membranes are normal. No oral lesions.  Left ear canal occupied by large collection of dark cerumen  Eyes: Conjunctivae are normal.  Cardiovascular: Normal rate.   Pulmonary/Chest: Effort normal.  Musculoskeletal: Normal range of motion.  Neurological: He is alert and oriented to person, place, and time.  Skin: Skin is warm and dry. No rash noted. No erythema.  Psychiatric: He has a normal mood and affect. His behavior is normal.    ED Course  Procedures (including critical care time) Labs Review Labs Reviewed - No data to display  Imaging Review No results found.   MDM   1. Excessive cerumen in ear canal, left    Left ear canal irrigated at [facility] and ear canal clear  of debris following irrigation. Follow up prn.    Lutricia Feil, PA 11/25/13 1359

## 2013-11-25 NOTE — Discharge Instructions (Signed)
Cerumen Impaction °A cerumen impaction is when the wax in your ear forms a plug. This plug usually causes reduced hearing. Sometimes it also causes an earache or dizziness. Removing a cerumen impaction can be difficult and painful. The wax sticks to the ear canal. The canal is sensitive and bleeds easily. If you try to remove a heavy wax buildup with a cotton tipped swab, you may push it in further. °Irrigation with water, suction, and small ear curettes may be used to clear out the wax. If the impaction is fixed to the skin in the ear canal, ear drops may be needed for a few days to loosen the wax. People who build up a lot of wax frequently can use ear wax removal products available in your local drugstore. °SEEK MEDICAL CARE IF:  °You develop an earache, increased hearing loss, or marked dizziness. °Document Released: 04/16/2004 Document Revised: 06/01/2011 Document Reviewed: 06/06/2009 °ExitCare® Patient Information ©2015 [employer]. This information is not intended to replace advice given to you by your health care provider. Make sure you discuss any questions you have with your health care provider. ° °

## 2013-11-25 NOTE — ED Provider Notes (Signed)
Medical screening examination/treatment/procedure(s) were performed by resident physician or non-physician practitioner and as supervising physician I was immediately available for consultation/collaboration.   Pauline Good MD.   Billy Fischer, MD 11/25/13 [phone]

## 2013-11-25 NOTE — ED Notes (Signed)
C/o  Left ear pain x 1 wk.  States "ear is sore and aching.  Some drainage."  Denies fever, n/v/d.   No relief with cleaning ears with peroxide.

## 2014-01-03 ENCOUNTER — Encounter: Payer: Self-pay | Admitting: Gastroenterology

## 2014-01-09 ENCOUNTER — Other Ambulatory Visit: Payer: Self-pay | Admitting: Internal Medicine

## 2014-01-09 NOTE — Telephone Encounter (Signed)
Ok #10 q 3rd night prn

## 2014-01-09 NOTE — Telephone Encounter (Signed)
Zolpidem called to [facility].

## 2014-03-09 ENCOUNTER — Other Ambulatory Visit: Payer: Self-pay | Admitting: Internal Medicine

## 2014-03-09 NOTE — Telephone Encounter (Signed)
Zolpidem has been called to [facility] on [address] and [address]

## 2014-03-09 NOTE — Telephone Encounter (Signed)
OK as written

## 2014-04-22 ENCOUNTER — Other Ambulatory Visit: Payer: Self-pay | Admitting: Internal Medicine

## 2014-06-08 ENCOUNTER — Other Ambulatory Visit: Payer: Self-pay | Admitting: Internal Medicine

## 2014-06-08 MED ORDER — ZOLPIDEM TARTRATE 5 MG PO TABS: ORAL_TABLET | ORAL | Status: DC

## 2014-06-08 NOTE — Telephone Encounter (Signed)
#  10 1 q 3rd night prn 

## 2014-06-08 NOTE — Telephone Encounter (Signed)
Called refill into [facility] spoke with Fraser Din Mullins/pharmacist gave md approval.../lmb

## 2014-08-09 ENCOUNTER — Encounter (HOSPITAL_COMMUNITY): Payer: Self-pay | Admitting: Emergency Medicine

## 2014-08-09 ENCOUNTER — Telehealth: Payer: Self-pay | Admitting: Internal Medicine

## 2014-08-09 ENCOUNTER — Inpatient Hospital Stay (HOSPITAL_COMMUNITY)
Admission: EM | Admit: 2014-08-09 | Discharge: 2014-08-12 | DRG: 872 | Disposition: A | Discharge status: Home / Self Care | Payer: 59 | Attending: Internal Medicine | Admitting: Internal Medicine

## 2014-08-09 DIAGNOSIS — A419 Sepsis, unspecified organism: Principal | ICD-10-CM | POA: Diagnosis present

## 2014-08-09 DIAGNOSIS — Z8547 Personal history of malignant neoplasm of testis: Secondary | ICD-10-CM | POA: Diagnosis not present

## 2014-08-09 DIAGNOSIS — I890 Lymphedema, not elsewhere classified: Secondary | ICD-10-CM | POA: Diagnosis present

## 2014-08-09 DIAGNOSIS — Z96651 Presence of right artificial knee joint: Secondary | ICD-10-CM | POA: Diagnosis present

## 2014-08-09 DIAGNOSIS — L309 Dermatitis, unspecified: Secondary | ICD-10-CM | POA: Diagnosis present

## 2014-08-09 DIAGNOSIS — L0390 Cellulitis, unspecified: Secondary | ICD-10-CM | POA: Diagnosis present

## 2014-08-09 DIAGNOSIS — L03116 Cellulitis of left lower limb: Secondary | ICD-10-CM | POA: Diagnosis present

## 2014-08-09 DIAGNOSIS — J45909 Unspecified asthma, uncomplicated: Secondary | ICD-10-CM | POA: Diagnosis not present

## 2014-08-09 DIAGNOSIS — E785 Hyperlipidemia, unspecified: Secondary | ICD-10-CM | POA: Diagnosis present

## 2014-08-09 DIAGNOSIS — L209 Atopic dermatitis, unspecified: Secondary | ICD-10-CM | POA: Diagnosis present

## 2014-08-09 DIAGNOSIS — B9689 Other specified bacterial agents as the cause of diseases classified elsewhere: Secondary | ICD-10-CM | POA: Diagnosis not present

## 2014-08-09 DIAGNOSIS — C6291 Malignant neoplasm of right testis, unspecified whether descended or undescended: Secondary | ICD-10-CM | POA: Diagnosis not present

## 2014-08-09 DIAGNOSIS — Z9079 Acquired absence of other genital organ(s): Secondary | ICD-10-CM | POA: Diagnosis not present

## 2014-08-09 HISTORY — DX: Malignant neoplasm of unspecified testis, unspecified whether descended or undescended: C62.90

## 2014-08-09 LAB — COMPREHENSIVE METABOLIC PANEL
ALT: 25 U/L (ref 17–63)
ANION GAP: 10 (ref 5–15)
AST: 26 U/L (ref 15–41)
Albumin: 3.4 g/dL — ABNORMAL LOW (ref 3.5–5.0)
Alkaline Phosphatase: 63 U/L (ref 38–126)
BUN: 12 mg/dL (ref 6–20)
CALCIUM: 9.2 mg/dL (ref 8.9–10.3)
CO2: 28 mmol/L (ref 22–32)
CREATININE: 1.03 mg/dL (ref 0.61–1.24)
Chloride: 102 mmol/L (ref 101–111)
GFR calc Af Amer: >60 mL/min (ref >60)
GFR calc non Af Amer: >60 mL/min (ref >60)
GLUCOSE: 109 mg/dL — AB (ref 65–99)
Potassium: 3.8 mmol/L (ref 3.5–5.1)
Sodium: 140 mmol/L (ref 135–145)
Total Bilirubin: 1.4 mg/dL — ABNORMAL HIGH (ref 0.3–1.2)
Total Protein: 7.1 g/dL (ref 6.5–8.1)

## 2014-08-09 LAB — CBC WITH DIFFERENTIAL/PLATELET
BASOS ABS: 0 10*3/uL (ref 0.0–0.1)
Basophils Relative: 0 % (ref 0–1)
Eosinophils Absolute: 0.4 10*3/uL (ref 0.0–0.7)
Eosinophils Relative: 3 % (ref 0–5)
HCT: 45.1 % (ref 39.0–52.0)
Hemoglobin: 15.3 g/dL (ref 13.0–17.0)
Lymphocytes Relative: 5 % — ABNORMAL LOW (ref 12–46)
Lymphs Abs: 0.7 10*3/uL (ref 0.7–4.0)
MCH: 30.8 pg (ref 26.0–34.0)
MCHC: 33.9 g/dL (ref 30.0–36.0)
MCV: 90.9 fL (ref 78.0–100.0)
Monocytes Absolute: 0.7 10*3/uL (ref 0.1–1.0)
Monocytes Relative: 5 % (ref 3–12)
NEUTROS ABS: 14 10*3/uL — AB (ref 1.7–7.7)
NEUTROS PCT: 87 % — AB (ref 43–77)
Platelets: 207 10*3/uL (ref 150–400)
RBC: 4.96 MIL/uL (ref 4.22–5.81)
RDW: 13.6 % (ref 11.5–15.5)
WBC: 15.9 10*3/uL — ABNORMAL HIGH (ref 4.0–10.5)

## 2014-08-09 MED ORDER — ONDANSETRON HCL 4 MG/2ML IJ SOLN: 4.0000 mg | Freq: Four times a day (QID) | INTRAMUSCULAR | Status: DC | PRN

## 2014-08-09 MED ORDER — CLINDAMYCIN PHOSPHATE 600 MG/50ML IV SOLN
600.0000 mg | Freq: Three times a day (TID) | INTRAVENOUS | Status: DC
  Administered 2014-08-09 – 2014-08-11 (×6): 600 mg via INTRAVENOUS
  Filled 2014-08-09 (×8): qty 50

## 2014-08-09 MED ORDER — BISACODYL 5 MG PO TBEC: 5.0000 mg | DELAYED_RELEASE_TABLET | Freq: Every day | ORAL | Status: DC | PRN

## 2014-08-09 MED ORDER — ENOXAPARIN SODIUM 40 MG/0.4ML ~~LOC~~ SOLN
40.0000 mg | SUBCUTANEOUS | Status: DC
  Filled 2014-08-09 (×4): qty 0.4

## 2014-08-09 MED ORDER — ACETAMINOPHEN 325 MG PO TABS: 650.0000 mg | ORAL_TABLET | Freq: Four times a day (QID) | ORAL | Status: DC | PRN

## 2014-08-09 MED ORDER — HYDROCODONE-ACETAMINOPHEN 5-325 MG PO TABS: 1.0000 | ORAL_TABLET | ORAL | Status: DC | PRN

## 2014-08-09 MED ORDER — VANCOMYCIN HCL IN DEXTROSE 750-5 MG/150ML-% IV SOLN
750.0000 mg | Freq: Two times a day (BID) | INTRAVENOUS | Status: DC
  Administered 2014-08-09 – 2014-08-11 (×5): 750 mg via INTRAVENOUS
  Filled 2014-08-09 (×6): qty 150

## 2014-08-09 MED ORDER — CLINDAMYCIN PHOSPHATE 600 MG/50ML IV SOLN
600.0000 mg | Freq: Once | INTRAVENOUS | Status: AC
  Administered 2014-08-09: 600 mg via INTRAVENOUS
  Filled 2014-08-09: qty 50

## 2014-08-09 MED ORDER — SODIUM CHLORIDE 0.9 % IV SOLN
INTRAVENOUS | Status: AC
  Administered 2014-08-09 – 2014-08-10 (×2): via INTRAVENOUS

## 2014-08-09 MED ORDER — CEFAZOLIN SODIUM 1-5 GM-% IV SOLN
1.0000 g | Freq: Three times a day (TID) | INTRAVENOUS | Status: DC
  Filled 2014-08-09 (×2): qty 50

## 2014-08-09 MED ORDER — POLYETHYLENE GLYCOL 3350 17 G PO PACK
17.0000 g | PACK | Freq: Every day | ORAL | Status: DC | PRN
  Filled 2014-08-09: qty 1

## 2014-08-09 MED ORDER — ACETAMINOPHEN 650 MG RE SUPP: 650.0000 mg | Freq: Four times a day (QID) | RECTAL | Status: DC | PRN

## 2014-08-09 MED ORDER — ONDANSETRON HCL 4 MG PO TABS: 4.0000 mg | ORAL_TABLET | Freq: Four times a day (QID) | ORAL | Status: DC | PRN

## 2014-08-09 MED ORDER — CEFAZOLIN SODIUM 1-5 GM-% IV SOLN
1.0000 g | Freq: Once | INTRAVENOUS | Status: AC
Start: 2014-08-09 — End: 2014-08-09
  Administered 2014-08-09: 1 g via INTRAVENOUS
  Filled 2014-08-09: qty 50

## 2014-08-09 NOTE — Progress Notes (Addendum)
ANTIBIOTIC CONSULT NOTE - INITIAL  Pharmacy Consult for ancef Indication: cellulitis  No Known Allergies  Patient Measurements:   Adjusted Body Weight:   Vital Signs: Temp: 98 F (36.7 C) (05/19 1411) Temp Source: Oral (05/19 1411) BP: 131/74 mmHg (05/19 1530) Pulse Rate: 78 (05/19 1530) Intake/Output from previous day:   Intake/Output from this shift:    Labs:  Recent Labs  08/09/14 1158  WBC 15.9*  HGB 15.3  PLT 207  CREATININE 1.03   CrCl cannot be calculated (Unknown ideal weight.). No results for input(s): VANCOTROUGH, VANCOPEAK, VANCORANDOM, GENTTROUGH, GENTPEAK, GENTRANDOM, TOBRATROUGH, TOBRAPEAK, TOBRARND, AMIKACINPEAK, AMIKACINTROU, AMIKACIN in the last 72 hours.   Microbiology: No results found for this or any previous visit (from the past 720 hour(s)).  Medical History: Past Medical History  Diagnosis Date  . Hyperlipidemia   . Asthma   . Eczema   . Cellulitis 2001     LLE ; [facility]  . Cellulitis 2007    R elbow ; S/ P drainage by Dr Telford Nab  . Edema   . Cancer 2004    hx of testicular  cancer; Dr Jeffie Pollock  . Cellulitis 2014    with sepsis    Medications:  Scheduled:  . clindamycin (CLEOCIN) IV  600 mg Intravenous Once  . clindamycin (CLEOCIN) IV  600 mg Intravenous 3 times per day  . enoxaparin (LOVENOX) injection  40 mg Subcutaneous Q24H   Infusions:  . sodium chloride     Assessment: 59 yo male with cellulitis will be put on cefazolin.  Patient received 1g of ancef @ 1543 on 08/09/14.  SCr 1.03 (CrCl ~ 69.6)  Goal of Therapy:  Resolution of infection  Plan:  - cefazolin 1g iv q8h, next dose @ 2330  Alie Hardgrove, Tsz-Yin 08/09/2014,4:23 PM   Addendum: Team has decided to change antibiotic to clindamycin and vancomycin.  CrCl ~69.6 and patient weighs 75.2 kg.  Plan - vancomycin 750 mg iv q12h - check vancomycin trough when it's appropriate - monitor renal function

## 2014-08-09 NOTE — ED Notes (Signed)
Pt states reddness is worsening.

## 2014-08-09 NOTE — Telephone Encounter (Signed)
Advised patient of dr hoppers note, patient stated he was already at the ED

## 2014-08-09 NOTE — Telephone Encounter (Signed)
I recommend [facility] or [facility] today; he has been hospitalized with this in past

## 2014-08-09 NOTE — Telephone Encounter (Signed)
Patient has history with cellulitis and he woke up today with a pretty bad flair up. I couldn't get him in till tomorrow and he is wondering if you can call him in cephALEXin (KEFLEX) 500 MG capsule [[id number]. Pharmacy is [facility] on [address]. Please advise.

## 2014-08-09 NOTE — Progress Notes (Signed)
Pt requesting to speak with doctor. Pt has questions regarding IV antibiotic dosage and frequency given. Paged on call MD. Awaiting callback, will continue to monitor pt.

## 2014-08-09 NOTE — ED Notes (Signed)
RN visualized leg; area has moved to above knee.

## 2014-08-09 NOTE — Telephone Encounter (Signed)
Wants rx for keflex until he sees you tomorrow---please advise, thanks

## 2014-08-09 NOTE — ED Provider Notes (Addendum)
CSN: [id number]     Arrival date & time 08/09/14  1140 History   First MD Initiated Contact with Patient 08/09/14 1503     Chief Complaint  Patient presents with  . Cellulitis     (Consider location/radiation/quality/duration/timing/severity/associated sxs/prior Treatment) HPI Comments: Patient with a history of eczema, lymphedema and recurrent cellulitis presents today with a one-day history of fever, chills and redness to the left lower leg. He states the last time he had cellulitis of his left lower leg was in 2014. At that time he had sepsis associated with cellulitis. He was evaluated by infectious disease at that time and started on cefazolin and clindamycin which seemed to improve his cellulitis. He was given a prescription for Keflex in his follow-up visit to start if he felt the cellulitis was returning however the prescription he had received 2 years ago was expired and he was unable to get any of antibiotics so came here for further evaluation. He denies any nausea, vomiting, abdominal pain, dizziness or syncope.  The history is provided by the patient and the spouse.    Past Medical History  Diagnosis Date  . Hyperlipidemia   . Asthma   . Eczema   . Cellulitis 2001     LLE ; [facility]  . Cellulitis 2007    R elbow ; S/ P drainage by Dr Telford Nab  . Edema   . Cancer 2004    hx of testicular  cancer; Dr Jeffie Pollock  . Cellulitis 2014    with sepsis   Past Surgical History  Procedure Laterality Date  . Seminoma  resection  2005     Dr Jeffie Pollock  . Arthroscopy of knee       X 2 Dr Noemi Chapel; 2 nd surgery for patellar instability  . Refractive surgery  2004  . Colonoscopy w/ polypectomy  05/14/2008 & 04/2012    Tubular adenoma; Dr. Verl Blalock  . I&d extremity  2004    elbow   Family History  Problem Relation Age of Onset  . Heart disease Father     dysrrhymthmia  . Colon cancer Father   . Liver cancer Father   . Transient ischemic attack Mother     in 76s  .  Asthma Sister   . Cancer Paternal Grandfather     stomach  . Eczema Maternal Uncle   . Diabetes Neg Hx    History  Substance Use Topics  . Smoking status: Never Smoker   . Smokeless tobacco: Never Used  . Alcohol Use: 5.4 oz/week    9 Cans of beer per week     Comment: occasionally    Review of Systems  All other systems reviewed and are negative.     Allergies  Review of patient's allergies indicates no known allergies.  Home Medications   Prior to Admission medications   Medication Sig Start Date End Date Taking? Authorizing Provider  ADVAIR DISKUS 100-50 MCG/DOSE AEPB INHALE 1 PUFF BY MOUTH TWICE DAILY 04/23/14  Yes Hendricks Limes, MD  betamethasone dipropionate (DIPROLENE) 0.05 % ointment Apply 1 application topically at bedtime. 08/08/13  Yes Hendricks Limes, MD  desonide (DESOWEN) 0.05 % ointment Apply 1 application topically at bedtime. 08/08/13  Yes Hendricks Limes, MD  ranitidine (ZANTAC) 150 MG tablet Take 150 mg by mouth 2 (two) times daily.   Yes Historical Provider, MD  VIAGRA 100 MG tablet TAKE ONE TABLET BY MOUTH AS NEEDED FOR ERECTILE DYSFUNCTION. 10/09/13  Yes Darrick Penna  Linna Darner, MD  simvastatin (ZOCOR) 20 MG tablet TAKE 1 TABLET BY MOUTH EVERY NIGHT AT BEDTIME Patient not taking: Reported on 08/09/2014 06/08/14   Hendricks Limes, MD  VIAGRA 100 MG tablet TAKE ONE TABLET BY MOUTH AS NEEDED FOR ERECTILE DYSFUNCTION. Patient not taking: Reported on 08/09/2014 06/08/14   Hendricks Limes, MD  zolpidem (AMBIEN) 5 MG tablet TAKE 1 TABLET BY MOUTH EVERY THIRD NIGHT AS NEEDED. Patient not taking: Reported on 08/09/2014 06/08/14   Hendricks Limes, MD   BP 118/76 mmHg  Pulse 70  Temp(Src) 98 F (36.7 C) (Oral)  Resp 18  SpO2 100% Physical Exam  Constitutional: He is oriented to person, place, and time. He appears well-developed and well-nourished. No distress.  HENT:  Head: Normocephalic and atraumatic.  Mouth/Throat: Oropharynx is clear and moist.  Eyes:  Conjunctivae and EOM are normal. Pupils are equal, round, and reactive to light.  Neck: Normal range of motion. Neck supple.  Cardiovascular: Normal rate, regular rhythm and intact distal pulses.   No murmur heard. Pulmonary/Chest: Effort normal and breath sounds normal. No respiratory distress. He has no wheezes. He has no rales.  Abdominal: Soft. He exhibits no distension. There is no tenderness. There is no rebound and no guarding.  Musculoskeletal: Normal range of motion. He exhibits edema. He exhibits no tenderness.  Lymphedema present of the left lower extremity  Neurological: He is alert and oriented to person, place, and time.  Skin: Skin is warm and dry. Rash noted. There is erythema.     Papular circular crusting lesions over the upper and lower extremities consistent with eczema  Psychiatric: He has a normal mood and affect. His behavior is normal.  Nursing note and vitals reviewed.   ED Course  Procedures (including critical care time) Labs Review Labs Reviewed  COMPREHENSIVE METABOLIC PANEL - Abnormal; Notable for the following:    Glucose, Bld 109 (*)    Albumin 3.4 (*)    Total Bilirubin 1.4 (*)    All other components within normal limits  CBC WITH DIFFERENTIAL/PLATELET - Abnormal; Notable for the following:    WBC 15.9 (*)    Neutrophils Relative % 87 (*)    Neutro Abs 14.0 (*)    Lymphocytes Relative 5 (*)    All other components within normal limits    Imaging Review No results found.   EKG Interpretation None      MDM   Final diagnoses:  Cellulitis of left lower extremity    Patient with recurrent cellulitis in his left lower extremity where he has chronic lymphedema. He has had multiple bouts of the cellulitis over the years. Most recent bout with cellulitis with sepsis 2 years ago. They state multiple antibiotics were tried but what ended up working with cefazolin and clindamycin per ID. Patient came early today and did not wait for his symptoms  to worsen. On exam he has evidence of cellulitis to the left lower extremity. He is otherwise in no acute distress. He was started on cefazolin and clindamycin as that is what worked last time. CBC and CMP were within normal limits except for a white blood cell count of 16,000. Will admit to the hospitalist for further care    Blanchie Dessert, MD 08/09/14 [place], MD 08/09/14 1531

## 2014-08-09 NOTE — H&P (Signed)
[facility] History and Physical  Gregory Phillips. [id number] DOB: 15-Apr-1955 DOA: 08/09/2014  Referring physician:  Maryan Phillips PCP:  Gregory Cobble, MD   Chief Complaint:  LLE redness and swelling  HPI:  The patient is a 59 y.o. year-old male with history of recurrent left lower extremity cellulitis secondary to chronic lymphedema, asthma, severe eczema followed by an allergist who presents with left lower extremity.  The patient was last at their baseline health yesterday when he developed some fullness and pain in his left groin with some enlarged lymph nodes. He states this is similar to when he had previous cellulitis of the left extremity. He subsequently developed a fever to 101.4 Fahrenheit and then noticed some increased redness and swelling of the medial portion of his left shin. Over the last 18 hours, he has had rapidly progressive erythema that has extended circumferentially around his left leg and has extended and streaky fashion up the medial aspect of his left anterior leg. He has some tenderness in that area and warmth. The last time he was admitted was in 2014 at which time he was seen by infectious disease because of slow response to his antibiotics. He was started on clindamycin and Ancef. He did not follow-up with infectious disease.    In the emergency department, vital signs are stable. His labs were notable for a white blood cell count of 15.9.  The emergency department physician ordered clindamycin and Ancef. He is being admitted for rapidly progressive cellulitis of the left lower extremity.  Review of Systems:  GeneraPositive fevers, denies chillsls, weight loss or gain HEENT:  Denies changes to hearing and vision, rhinorrhea, sinus congestion, sore throat CV:  Denies chest pain and palpitations, lower extremity edema.  PULM:  Denies SOB, wheezing, cough.   GI:  Denies nausea, vomiting, constipation, diarrhea.   GU:  Denies dysuria, frequency, urgency ENDO:   Denies polyuria, polydipsia.   HEME:  Denies hematemesis, blood in stools, melena, abnormal bruising or bleeding.  LYMPH:Swollen lymph node in the left groin MSK:  Denies arthralgias, myalgias.   DERM:  Denies skin rash or ulcer.   NEURO:  Denies focal numbness, weakness, slurred speech, confusion, facial droop.  PSYCH:  Denies anxiety and depression.    Past Medical History  Diagnosis Date  . Hyperlipidemia   . Asthma   . Eczema   . Cellulitis 2001     LLE ; Gregory Phillips  . Cellulitis 2007    R elbow ; S/ P drainage by Gregory Gregory Phillips  . Edema   . Cancer 2004    hx of testicular  cancer; Gregory Phillips  . Cellulitis 2014    with sepsis   Past Surgical History  Procedure Laterality Date  . Seminoma  resection  2005     Gregory Phillips  . Arthroscopy of knee       X 2 Gregory Phillips; 2 nd surgery for patellar instability  . Refractive surgery  2004  . Colonoscopy w/ polypectomy  05/14/2008 & 04/2012    Tubular adenoma; Gregory. Verl Phillips  . I&d extremity  2004    elbow   Social History:  reports that he has never smoked. He has never used smokeless tobacco. He reports that he drinks about 5.4 oz of alcohol per week. He reports that he does not use illicit drugs.   No Known Allergies  Family History  Problem Relation Age of Onset  . Heart disease Father     dysrrhymthmia  .  Colon cancer Father   . Liver cancer Father   . Transient ischemic attack Mother     in 18s  . Asthma Sister   . Cancer Paternal Grandfather     stomach  . Eczema Maternal Uncle   . Diabetes Neg Hx      Prior to Admission medications   Medication Sig Start Date End Date Taking? Authorizing Provider  ADVAIR DISKUS 100-50 MCG/DOSE AEPB INHALE 1 PUFF BY MOUTH TWICE DAILY 04/23/14  Yes Gregory Limes, MD  betamethasone dipropionate (DIPROLENE) 0.05 % ointment Apply 1 application topically at bedtime. 08/08/13  Yes Gregory Limes, MD  desonide (DESOWEN) 0.05 % ointment Apply 1 application topically at bedtime.  08/08/13  Yes Gregory Limes, MD  ranitidine (ZANTAC) 150 MG tablet Take 150 mg by mouth daily as needed.    Yes Historical Provider, MD  VIAGRA 100 MG tablet TAKE ONE TABLET BY MOUTH AS NEEDED FOR ERECTILE DYSFUNCTION. 10/09/13  Yes Gregory Limes, MD   Physical Exam: Filed Vitals:   08/09/14 1146 08/09/14 1411 08/09/14 1508 08/09/14 1530  BP: 130/86 131/90 118/76 131/74  Pulse: 78 70  78  Temp: 97.7 F (36.5 C) 98 F (36.7 C)    TempSrc: Oral Oral    Resp: 20 18    SpO2: 96% 100%  99%     GeneraWell-appearing adult male, no acute distress  Eyes:  PERRL, anicteric, non-injected.  ENT:  Nares clear.  OP clear, non-erythematous without plaques or exudates.  MMM.  Neck:  Supple without TM or JVD.    Lymph:  No cervical, supraclavicular, or submandibular LAD.  Cardiovascular:  RRR, normal S1, S2, without m/r/g.  2+ pulses, warm extremities  Respiratory:  CTA bilaterally without increased WOB.  Abdomen:  NABS.  Soft, ND/NT.    Skin:  Diffuse eczematous rash on arms, chest, legs with scaling. His left lower extremity has cellulitis which I marked that extends to just below the patella and then proceeds cranially along the medial aspects of the leg along the lymphatics. The erythema is bright pink with well demarcated lines and surrounds his leg circumferentially. It extends to just above the ankle. He has a separate area of erythema along the rim and part of the dorsum of the left foot.   Musculoskeletal:  Normal bulk and tone.  1+ left lower extremity edema   Psychiatric:  A & O x 4.  Appropriate affect.  Neurologic:  CN 3-12 intact.  5/5 strength.  Sensation intact.  Labs on Admission:  Basic Metabolic Panel:  Recent Labs Lab 08/09/14 1158  NA 140  K 3.8  CL 102  CO2 28  GLUCOSE 109*  BUN 12  CREATININE 1.03  CALCIUM 9.2   Liver Function Tests:  Recent Labs Lab 08/09/14 1158  AST 26  ALT 25  ALKPHOS 63  BILITOT 1.4*  PROT 7.1  ALBUMIN 3.4*   No  results for input(s): LIPASE, AMYLASE in the last 168 hours. No results for input(s): AMMONIA in the last 168 hours. CBC:  Recent Labs Lab 08/09/14 1158  WBC 15.9*  NEUTROABS 14.0*  HGB 15.3  HCT 45.1  MCV 90.9  PLT 207   Cardiac Enzymes: No results for input(s): CKTOTAL, CKMB, CKMBINDEX, TROPONINI in the last 168 hours.  BNP (last 3 results) No results for input(s): BNP in the last 8760 hours.  ProBNP (last 3 results) No results for input(s): PROBNP in the last 8760 hours.  CBG: No results for input(s): GLUCAP  in the last 168 hours.  Radiological Exams on Admission: No results found.  EKG: pending   Assessment/Plan Active Problems:   Cellulitis  ---  Rapidly progressive cellulitis of the left lower extremity. The case was discussed with Gregory Phillips from infectious disease who recommended starting the patient on clindamycin and vancomycin.  Elevate the left lower extremity. Tylenol and Vicodin for pain control.    Asthma and eczema appears stable. Continue Advair and steroid creams.   Leukocytosis, likely related to cellulitis. Antibiotics as above and repeat in a.m.  DietRegular AccePIV IVF:Yes PropLovenox  Code StatDO NOT RESUSCITATE Family Commpatient and his wife Disposition Plan: Admit to  MedSurg  Time spent: 60 min Gregory Phillips [facility] Pager [phone]  If 7PM-7AM, please contact night-coverage [email or web address] Password [email or web address] 08/09/2014, 4:16 PM

## 2014-08-09 NOTE — ED Notes (Addendum)
Pt has left leg redness and swelling to lower extremity to knee and below. Had temp this morning at 0400. Reports hx of such and trying to not wait till gets out of control. Dr. Heide Guile did not return call and did not want to wait.

## 2014-08-10 ENCOUNTER — Ambulatory Visit: Payer: Self-pay | Admitting: Internal Medicine

## 2014-08-10 DIAGNOSIS — C6291 Malignant neoplasm of right testis, unspecified whether descended or undescended: Secondary | ICD-10-CM

## 2014-08-10 DIAGNOSIS — Z9079 Acquired absence of other genital organ(s): Secondary | ICD-10-CM

## 2014-08-10 DIAGNOSIS — J45909 Unspecified asthma, uncomplicated: Secondary | ICD-10-CM

## 2014-08-10 DIAGNOSIS — L209 Atopic dermatitis, unspecified: Secondary | ICD-10-CM

## 2014-08-10 DIAGNOSIS — L03116 Cellulitis of left lower limb: Secondary | ICD-10-CM

## 2014-08-10 DIAGNOSIS — A419 Sepsis, unspecified organism: Principal | ICD-10-CM

## 2014-08-10 DIAGNOSIS — B9689 Other specified bacterial agents as the cause of diseases classified elsewhere: Secondary | ICD-10-CM

## 2014-08-10 DIAGNOSIS — R600 Localized edema: Secondary | ICD-10-CM

## 2014-08-10 DIAGNOSIS — Z9889 Other specified postprocedural states: Secondary | ICD-10-CM

## 2014-08-10 DIAGNOSIS — L309 Dermatitis, unspecified: Secondary | ICD-10-CM

## 2014-08-10 LAB — BASIC METABOLIC PANEL
ANION GAP: 7 (ref 5–15)
BUN: 11 mg/dL (ref 6–20)
CHLORIDE: 107 mmol/L (ref 101–111)
CO2: 27 mmol/L (ref 22–32)
Calcium: 8.4 mg/dL — ABNORMAL LOW (ref 8.9–10.3)
Creatinine, Ser: 0.89 mg/dL (ref 0.61–1.24)
GFR calc non Af Amer: >60 mL/min (ref >60)
Glucose, Bld: 112 mg/dL — ABNORMAL HIGH (ref 65–99)
POTASSIUM: 3.8 mmol/L (ref 3.5–5.1)
Sodium: 141 mmol/L (ref 135–145)

## 2014-08-10 LAB — CBC
HEMATOCRIT: 41.7 % (ref 39.0–52.0)
Hemoglobin: 14 g/dL (ref 13.0–17.0)
MCH: 30.4 pg (ref 26.0–34.0)
MCHC: 33.6 g/dL (ref 30.0–36.0)
MCV: 90.7 fL (ref 78.0–100.0)
Platelets: 206 10*3/uL (ref 150–400)
RBC: 4.6 MIL/uL (ref 4.22–5.81)
RDW: 13.7 % (ref 11.5–15.5)
WBC: 9.9 10*3/uL (ref 4.0–10.5)

## 2014-08-10 LAB — MRSA PCR SCREENING: MRSA BY PCR: NEGATIVE

## 2014-08-10 MED ORDER — BETAMETHASONE DIPROPIONATE 0.05 % EX OINT
1.0000 "application " | TOPICAL_OINTMENT | Freq: Every day | CUTANEOUS | Status: DC
  Administered 2014-08-11: 1 via TOPICAL
  Filled 2014-08-10 (×9): qty 1

## 2014-08-10 MED ORDER — MOMETASONE FURO-FORMOTEROL FUM 100-5 MCG/ACT IN AERO
2.0000 | INHALATION_SPRAY | Freq: Two times a day (BID) | RESPIRATORY_TRACT | Status: DC
  Administered 2014-08-10 – 2014-08-12 (×4): 2 via RESPIRATORY_TRACT
  Filled 2014-08-10: qty 8.8

## 2014-08-10 MED ORDER — DESONIDE 0.05 % EX OINT: 1.0000 "application " | TOPICAL_OINTMENT | Freq: Every day | CUTANEOUS | Status: DC

## 2014-08-10 NOTE — Consult Note (Signed)
[facility]  Date of Admission:  08/09/2014  Date of Consult:  08/10/2014  Reason for Consult: recurrent cellulitis Referring Physician: Short  Impression/Recommendation Recurrent cellulitis Seminoma, R orchiectomy, inguinal hernia repair 2005  Would Continue vanco/clinda Keep leg elevated.  consdier derm f/u for improved control of his eczema.  Dr Tommy Medal will follow up over the weekend.   Comment-  I spoke with him and his wife at length. I have given him a slightly lower dose of clinda due to concerns about C diff. I used vanco as he is heavily anbx experienced and wanted to cover him slightly more broadly.  I suggested that he could be on chronic low dose suppressive anbx when he is improved. Would ask that he has clinic follow up to start this.  As for other ways to decrease his recurrences, improving his edema (compression devices) and improving his eczema (derm f/u) would be very helpful.   I apologized that he did not receive a return call from the clinic. I will follow up with our clinic about this.   Thank you so much for this interesting consult,   Gregory Phillips (pager) [phone] [email or web address]  Gregory Phillips. is an 59 y.o. male.  HPI: 59 yo M with hx chronic lymphedema, and cellulitis of his L leg 06-2011, 08-2011, 05-2012 He returns 5-19 with temp 101.4 and increasing erythema, swelling of his L shin. This progressed over next 24h (including proximal streaking) and he came to ED.  In ED he was found to have WBC 15.9 and temp 97.7.He was started on clinda and vanco.  Since admission his WBC has normalized and he has remained afebrile.   Past Medical History  Diagnosis Date  . Hyperlipidemia   . Asthma   . Eczema   . Cellulitis 2001     LLE ; [facility]  . Cellulitis 2007    R elbow ; S/ P drainage by Dr Telford Nab  . Edema   . Cellulitis 2014    with sepsis  . Testicular cancer 2004    Dr Jeffie Pollock  .  Cellulitis 08/09/2014    LLE    Past Surgical History  Procedure Laterality Date  . Tumor excision  2005     Seminoma; Dr Jeffie Pollock  . Knee arthroscopy Right 1981     Dr Noemi Chapel  . Refractive surgery Bilateral 2004  . Colonoscopy w/ polypectomy  05/14/2008 & 04/2012    Tubular adenoma; Dr. Verl Blalock  . I&d extremity Left 2004    elbow  . Foot fracture surgery Left 05/2012  . Knee arthroplasty Right      Dr Noemi Chapel,  for patellar instability  . Fracture surgery       No Known Allergies  Medications:  Scheduled: . clindamycin (CLEOCIN) IV  600 mg Intravenous 3 times per day  . enoxaparin (LOVENOX) injection  40 mg Subcutaneous Q24H  . vancomycin  750 mg Intravenous Q12H    Abtx:  Anti-infectives    Start     Dose/Rate Route Frequency Ordered Stop   08/09/14 2330  ceFAZolin (ANCEF) IVPB 1 g/50 mL premix  Status:  Discontinued     1 g 100 mL/hr over 30 Minutes Intravenous Every 8 hours 08/09/14 1627 08/09/14 1627   08/09/14 2200  clindamycin (CLEOCIN) IVPB 600 mg     600 mg 100 mL/hr over 30 Minutes Intravenous 3 times per day 08/09/14 1620     08/09/14 1800  vancomycin (  VANCOCIN) IVPB 750 mg/150 ml premix     750 mg 150 mL/hr over 60 Minutes Intravenous Every 12 hours 08/09/14 1643     08/09/14 1530  ceFAZolin (ANCEF) IVPB 1 g/50 mL premix     1 g 100 mL/hr over 30 Minutes Intravenous  Once 08/09/14 1521 08/09/14 1621   08/09/14 1530  clindamycin (CLEOCIN) IVPB 600 mg     600 mg 100 mL/hr over 30 Minutes Intravenous  Once 08/09/14 1521 08/09/14 1656      Total days of antibiotics: 1 (vanco/clinda)          Social History:  reports that he has never smoked. He has never used smokeless tobacco. He reports that he drinks about 7.2 oz of alcohol per week. He reports that he does not use illicit drugs.  Family History  Problem Relation Age of Onset  . Heart disease Father     dysrrhymthmia  . Colon cancer Father   . Liver cancer Father   . Transient ischemic attack  Mother     in 36s  . Asthma Sister   . Cancer Paternal Grandfather     stomach  . Eczema Maternal Uncle   . Diabetes Neg Hx     General ROS: no diarrhea, fever better. erythema improved, swelling slightly better. see HPI.   Blood pressure 120/83, pulse 70, temperature 98.2 F (36.8 C), temperature source Oral, resp. rate 16, height 5' 6"  (1.676 m), weight 76.3 kg (168 lb 3.4 oz), SpO2 96 %. General appearance: alert, cooperative and no distress Eyes: negative findings: conjunctivae and sclerae normal and pupils equal, round, reactive to light and accomodation Throat: lips, mucosa, and tongue normal; teeth and gums normal Neck: no adenopathy and supple, symmetrical, trachea midline Lungs: clear to auscultation bilaterally Heart: regular rate and rhythm Abdomen: normal findings: bowel sounds normal and soft, non-tender Extremities: erythema centered a mid calf on LLE. eczema. mild increase in heat. mild swelling.    Results for orders placed or performed during the hospital encounter of 08/09/14 (from the past 48 hour(s))  Comprehensive metabolic panel     Status: Abnormal   Collection Time: 08/09/14 11:58 AM  Result Value Ref Range   Sodium 140 135 - 145 mmol/L   Potassium 3.8 3.5 - 5.1 mmol/L   Chloride 102 101 - 111 mmol/L   CO2 28 22 - 32 mmol/L   Glucose, Bld 109 (H) 65 - 99 mg/dL   BUN 12 6 - 20 mg/dL   Creatinine, Ser 1.03 0.61 - 1.24 mg/dL   Calcium 9.2 8.9 - 10.3 mg/dL   Total Protein 7.1 6.5 - 8.1 g/dL   Albumin 3.4 (L) 3.5 - 5.0 g/dL   AST 26 15 - 41 U/L   ALT 25 17 - 63 U/L   Alkaline Phosphatase 63 38 - 126 U/L   Total Bilirubin 1.4 (H) 0.3 - 1.2 mg/dL   GFR calc non Af Amer >60 >60 mL/min   GFR calc Af Amer >60 >60 mL/min    Comment: (NOTE) The eGFR has been calculated using the CKD EPI equation. This calculation has not been validated in all clinical situations. eGFR's persistently <60 mL/min signify possible Chronic Kidney Disease.    Anion gap 10 5 - 15   CBC with Differential     Status: Abnormal   Collection Time: 08/09/14 11:58 AM  Result Value Ref Range   WBC 15.9 (H) 4.0 - 10.5 K/uL   RBC 4.96 4.22 - 5.81 MIL/uL  Hemoglobin 15.3 13.0 - 17.0 g/dL   HCT 45.1 39.0 - 52.0 %   MCV 90.9 78.0 - 100.0 fL   MCH 30.8 26.0 - 34.0 pg   MCHC 33.9 30.0 - 36.0 g/dL   RDW 13.6 11.5 - 15.5 %   Platelets 207 150 - 400 K/uL   Neutrophils Relative % 87 (H) 43 - 77 %   Neutro Abs 14.0 (H) 1.7 - 7.7 K/uL   Lymphocytes Relative 5 (L) 12 - 46 %   Lymphs Abs 0.7 0.7 - 4.0 K/uL   Monocytes Relative 5 3 - 12 %   Monocytes Absolute 0.7 0.1 - 1.0 K/uL   Eosinophils Relative 3 0 - 5 %   Eosinophils Absolute 0.4 0.0 - 0.7 K/uL   Basophils Relative 0 0 - 1 %   Basophils Absolute 0.0 0.0 - 0.1 K/uL  MRSA PCR Screening     Status: None   Collection Time: 08/09/14 11:19 PM  Result Value Ref Range   MRSA by PCR NEGATIVE NEGATIVE    Comment:        The GeneXpert MRSA Assay (FDA approved for NASAL specimens only), is one component of a comprehensive MRSA colonization surveillance program. It is not intended to diagnose MRSA infection nor to guide or monitor treatment for MRSA infections.   Basic metabolic panel     Status: Abnormal   Collection Time: 08/10/14  4:23 AM  Result Value Ref Range   Sodium 141 135 - 145 mmol/L   Potassium 3.8 3.5 - 5.1 mmol/L   Chloride 107 101 - 111 mmol/L   CO2 27 22 - 32 mmol/L   Glucose, Bld 112 (H) 65 - 99 mg/dL   BUN 11 6 - 20 mg/dL   Creatinine, Ser 0.89 0.61 - 1.24 mg/dL   Calcium 8.4 (L) 8.9 - 10.3 mg/dL   GFR calc non Af Amer >60 >60 mL/min   GFR calc Af Amer >60 >60 mL/min    Comment: (NOTE) The eGFR has been calculated using the CKD EPI equation. This calculation has not been validated in all clinical situations. eGFR's persistently <60 mL/min signify possible Chronic Kidney Disease.    Anion gap 7 5 - 15  CBC     Status: None   Collection Time: 08/10/14  4:23 AM  Result Value Ref Range   WBC 9.9  4.0 - 10.5 K/uL   RBC 4.60 4.22 - 5.81 MIL/uL   Hemoglobin 14.0 13.0 - 17.0 g/dL   HCT 41.7 39.0 - 52.0 %   MCV 90.7 78.0 - 100.0 fL   MCH 30.4 26.0 - 34.0 pg   MCHC 33.6 30.0 - 36.0 g/dL   RDW 13.7 11.5 - 15.5 %   Platelets 206 150 - 400 K/uL      Component Value Date/Time   SDES BLOOD HAND LEFT 06/10/2012 2104   SPECREQUEST BOTTLES DRAWN AEROBIC AND ANAEROBIC 10CC 06/10/2012 2104   CULT NO GROWTH 5 DAYS 06/10/2012 2104   REPTSTATUS 06/17/2012 FINAL 06/10/2012 2104   No results found. Recent Results (from the past 240 hour(s))  MRSA PCR Screening     Status: None   Collection Time: 08/09/14 11:19 PM  Result Value Ref Range Status   MRSA by PCR NEGATIVE NEGATIVE Final    Comment:        The GeneXpert MRSA Assay (FDA approved for NASAL specimens only), is one component of a comprehensive MRSA colonization surveillance program. It is not intended to diagnose MRSA infection nor to  guide or monitor treatment for MRSA infections.       08/10/2014, 3:45 PM     LOS: 1 day

## 2014-08-10 NOTE — Progress Notes (Signed)
Utilization review completed.  

## 2014-08-10 NOTE — Plan of Care (Signed)
Problem: Phase I Progression Outcomes Goal: Initial discharge plan identified Outcome: Completed/Met Date Met:  08/10/14 To return home with wife

## 2014-08-10 NOTE — Progress Notes (Signed)
[facility] Progress Note   Gregory Phillips. [id number] DOB: [birth year] DOA: 08/09/2014 PCP: Unice Cobble, MD  Brief narrative: Gregory Phillips. is a 59 y.o. male with chronic lymphedema of the left leg, asthma, severe eczema and multiple episodes of left leg cellulitis who presents to the hospital for left leg cellulitis once again. It started with fullness and pain in left groin and was followed by a rapidly progressive erythema and swelling of the foot and leg up to the knee. He had a fever of 101.4.     Subjective: Decreased erythema and pain in left leg/ foot. He is able to ambulate without difficulty. No fevers, chills, nausea, vomiting diarrhea.   Assessment/Plan: Principal Problem:   Cellulitis/  Sepsis - left leg- cont Vanc and Clindamycin which appears to be working well- leukocytosis and fever esolved- erythema improving. - the patient has requested an ID consult and I have communicated this to the on call ID physician   Active Problems:   Asthma - cont Advair    Atopic eczema - cont topical ointments   Code Status: full code  Disposition Plan: home when stable DVT prophylaxis: lovenox Consultants:ID Procedures:  Antibiotics: Anti-infectives    Start     Dose/Rate Route Frequency Ordered Stop   08/09/14 2330  ceFAZolin (ANCEF) IVPB 1 g/50 mL premix  Status:  Discontinued     1 g 100 mL/hr over 30 Minutes Intravenous Every 8 hours 08/09/14 1627 08/09/14 1627   08/09/14 2200  clindamycin (CLEOCIN) IVPB 600 mg     600 mg 100 mL/hr over 30 Minutes Intravenous 3 times per day 08/09/14 1620     08/09/14 1800  vancomycin (VANCOCIN) IVPB 750 mg/150 ml premix     750 mg 150 mL/hr over 60 Minutes Intravenous Every 12 hours 08/09/14 1643     08/09/14 1530  ceFAZolin (ANCEF) IVPB 1 g/50 mL premix     1 g 100 mL/hr over 30 Minutes Intravenous  Once 08/09/14 1521 08/09/14 1621   08/09/14 1530  clindamycin (CLEOCIN) IVPB 600 mg     600 mg 100 mL/hr  over 30 Minutes Intravenous  Once 08/09/14 1521 08/09/14 1656      Objective: Filed Weights   08/10/14 0500  Weight: 76.3 kg (168 lb 3.4 oz)    Intake/Output Summary (Last 24 hours) at 08/10/14 1627 Last data filed at 08/10/14 0931  Gross per 24 hour  Intake    240 ml  Output    700 ml  Net   -460 ml     Vitals Filed Vitals:   08/09/14 1530 08/09/14 1629 08/10/14 0500 08/10/14 1419  BP: 131/74 119/79  120/83  Pulse: 78 70    Temp:  98.2 F (36.8 C)  98.2 F (36.8 C)  TempSrc:  Oral  Oral  Resp:  16  16  Height:   5\' 6"  (1.676 m)   Weight:   76.3 kg (168 lb 3.4 oz)   SpO2: 99% 97%  96%    Exam:  General:  Pt is alert, not in acute distress  HEENT: No icterus, No thrush  Cardiovascular: regular rate and rhythm, S1/S2 No murmur  Respiratory: clear to auscultation bilaterally   Abdomen: Soft, +Bowel sounds, non tender, non distended, no guarding  MSK: No LE cyanosis or clubbing- left leg edema, no significant tenderness  Skin: erythema from left knee to toes,   Data Reviewed: Basic Metabolic Panel:  Recent Labs Lab 08/09/14 1158 08/10/14 0423  NA  140 141  K 3.8 3.8  CL 102 107  CO2 28 27  GLUCOSE 109* 112*  BUN 12 11  CREATININE 1.03 0.89  CALCIUM 9.2 8.4*   Liver Function Tests:  Recent Labs Lab 08/09/14 1158  AST 26  ALT 25  ALKPHOS 63  BILITOT 1.4*  PROT 7.1  ALBUMIN 3.4*   No results for input(s): LIPASE, AMYLASE in the last 168 hours. No results for input(s): AMMONIA in the last 168 hours. CBC:  Recent Labs Lab 08/09/14 1158 08/10/14 0423  WBC 15.9* 9.9  NEUTROABS 14.0*  --   HGB 15.3 14.0  HCT 45.1 41.7  MCV 90.9 90.7  PLT 207 206   Cardiac Enzymes: No results for input(s): CKTOTAL, CKMB, CKMBINDEX, TROPONINI in the last 168 hours. BNP (last 3 results) No results for input(s): BNP in the last 8760 hours.  ProBNP (last 3 results) No results for input(s): PROBNP in the last 8760 hours.  CBG: No results for input(s):  GLUCAP in the last 168 hours.  Recent Results (from the past 240 hour(s))  MRSA PCR Screening     Status: None   Collection Time: 08/09/14 11:19 PM  Result Value Ref Range Status   MRSA by PCR NEGATIVE NEGATIVE Final    Comment:        The GeneXpert MRSA Assay (FDA approved for NASAL specimens only), is one component of a comprehensive MRSA colonization surveillance program. It is not intended to diagnose MRSA infection nor to guide or monitor treatment for MRSA infections.      Studies: No results found.  Scheduled Meds:  Scheduled Meds: . betamethasone dipropionate  1 application Topical QHS  . clindamycin (CLEOCIN) IV  600 mg Intravenous 3 times per day  . desonide  1 application Topical QHS  . enoxaparin (LOVENOX) injection  40 mg Subcutaneous Q24H  . mometasone-formoterol  2 puff Inhalation BID  . vancomycin  750 mg Intravenous Q12H   Continuous Infusions:   Time spent on care of this patient: 30 min   [place], MD 08/10/2014, 4:27 PM  LOS: 1 day   [facility] Office  [phone] Pager - Text Page per [email or web address]  If 7PM-7AM, please contact night-coverage [email or web address]

## 2014-08-11 ENCOUNTER — Other Ambulatory Visit: Payer: Self-pay | Admitting: Internal Medicine

## 2014-08-11 DIAGNOSIS — I890 Lymphedema, not elsewhere classified: Secondary | ICD-10-CM | POA: Insufficient documentation

## 2014-08-11 LAB — BASIC METABOLIC PANEL
Anion gap: 8 (ref 5–15)
BUN: 9 mg/dL (ref 6–20)
CALCIUM: 8.3 mg/dL — AB (ref 8.9–10.3)
CHLORIDE: 103 mmol/L (ref 101–111)
CO2: 27 mmol/L (ref 22–32)
Creatinine, Ser: 0.72 mg/dL (ref 0.61–1.24)
GFR calc non Af Amer: >60 mL/min (ref >60)
Glucose, Bld: 115 mg/dL — ABNORMAL HIGH (ref 65–99)
POTASSIUM: 4.2 mmol/L (ref 3.5–5.1)
SODIUM: 138 mmol/L (ref 135–145)

## 2014-08-11 LAB — CBC
HEMATOCRIT: 43.1 % (ref 39.0–52.0)
Hemoglobin: 14.3 g/dL (ref 13.0–17.0)
MCH: 30.1 pg (ref 26.0–34.0)
MCHC: 33.2 g/dL (ref 30.0–36.0)
MCV: 90.7 fL (ref 78.0–100.0)
PLATELETS: 214 10*3/uL (ref 150–400)
RBC: 4.75 MIL/uL (ref 4.22–5.81)
RDW: 13.5 % (ref 11.5–15.5)
WBC: 8 10*3/uL (ref 4.0–10.5)

## 2014-08-11 MED ORDER — CEFAZOLIN SODIUM-DEXTROSE 2-3 GM-% IV SOLR
2.0000 g | Freq: Three times a day (TID) | INTRAVENOUS | Status: DC
  Administered 2014-08-11 – 2014-08-12 (×3): 2 g via INTRAVENOUS
  Filled 2014-08-11 (×4): qty 50

## 2014-08-11 NOTE — Progress Notes (Signed)
[facility]         Subjective:  Patient really wanted to be back on cefazolin as he thinks this works best for him.  Note when I came by earlier the patient was not in his room but apparently walking off the floor. I informed him that walking at a great deal while good for DVT prophylaxis was not good for making cellulitis looked better quickly. Rather leg elevation above the heart is important to making cellulitis improve more rapidly.   Antibiotics:  Anti-infectives    Start     Dose/Rate Route Frequency Ordered Stop   08/11/14 1800  ceFAZolin (ANCEF) IVPB 2 g/50 mL premix     2 g 100 mL/hr over 30 Minutes Intravenous Every 8 hours 08/11/14 1733     08/09/14 2330  ceFAZolin (ANCEF) IVPB 1 g/50 mL premix  Status:  Discontinued     1 g 100 mL/hr over 30 Minutes Intravenous Every 8 hours 08/09/14 1627 08/09/14 1627   08/09/14 2200  clindamycin (CLEOCIN) IVPB 600 mg  Status:  Discontinued     600 mg 100 mL/hr over 30 Minutes Intravenous 3 times per day 08/09/14 1620 08/11/14 1733   08/09/14 1800  vancomycin (VANCOCIN) IVPB 750 mg/150 ml premix  Status:  Discontinued     750 mg 150 mL/hr over 60 Minutes Intravenous Every 12 hours 08/09/14 1643 08/11/14 1733   08/09/14 1530  ceFAZolin (ANCEF) IVPB 1 g/50 mL premix     1 g 100 mL/hr over 30 Minutes Intravenous  Once 08/09/14 1521 08/09/14 1621   08/09/14 1530  clindamycin (CLEOCIN) IVPB 600 mg     600 mg 100 mL/hr over 30 Minutes Intravenous  Once 08/09/14 1521 08/09/14 1656      Medications: Scheduled Meds: . betamethasone dipropionate  1 application Topical QHS  .  ceFAZolin (ANCEF) IV  2 g Intravenous Q8H  . enoxaparin (LOVENOX) injection  40 mg Subcutaneous Q24H  . mometasone-formoterol  2 puff Inhalation BID   Continuous Infusions:  PRN Meds:.acetaminophen **OR** acetaminophen, bisacodyl, HYDROcodone-acetaminophen, ondansetron **OR** ondansetron (ZOFRAN) IV, polyethylene  glycol    Objective: Weight change:   Intake/Output Summary (Last 24 hours) at 08/11/14 1908 Last data filed at 08/11/14 1747  Gross per 24 hour  Intake    832 ml  Output   1375 ml  Net   -543 ml   Blood pressure 131/74, pulse 75, temperature 98.2 F (36.8 C), temperature source Oral, resp. rate 18, height 5\' 6"  (1.676 m), weight 168 lb 3.4 oz (76.3 kg), SpO2 100 %. Temp:  [98 F (36.7 C)-98.2 F (36.8 C)] 98.2 F (36.8 C) (05/21 1324) Pulse Rate:  [64-83] 75 (05/21 1324) Resp:  [18] 18 (05/21 1324) BP: (97-131)/(59-84) 131/74 mmHg (05/21 1324) SpO2:  [95 %-100 %] 100 % (05/21 1324)  Physical Exam: General: Alert and awake, oriented x3, not in any acute distress. HEENT: anicteric sclera, pupils reactive to light and accommodation, EOMI CVS regular rate Chest: no wheezing, r Abdomen:  nondistended,   Extremities:   08/11/2014:  Still some confluent erythema and edema. He states it has not changed much since yesterday      Neuro: nonfocal  CBC: @LABBLAST3 (wbc3,Hgb:3,Hct:3,Plt:3,INR:3APTT:3)@   BMET  Recent Labs  08/10/14 0423 08/11/14 0651  NA 141 138  K 3.8 4.2  CL 107 103  CO2 27 27  GLUCOSE 112* 115*  BUN 11 9  CREATININE 0.89 0.72  CALCIUM 8.4* 8.3*  Liver Panel   Recent Labs  08/09/14 1158  PROT 7.1  ALBUMIN 3.4*  AST 26  ALT 25  ALKPHOS 63  BILITOT 1.4*       Sedimentation Rate No results for input(s): ESRSEDRATE in the last 72 hours. C-Reactive Protein No results for input(s): CRP in the last 72 hours.  Micro Results: Recent Results (from the past 720 hour(s))  MRSA PCR Screening     Status: None   Collection Time: 08/09/14 11:19 PM  Result Value Ref Range Status   MRSA by PCR NEGATIVE NEGATIVE Final    Comment:        The GeneXpert MRSA Assay (FDA approved for NASAL specimens only), is one component of a comprehensive MRSA colonization surveillance program. It is not intended to diagnose MRSA infection nor to  guide or monitor treatment for MRSA infections.     Studies/Results: No results found.    Assessment/Plan:  Principal Problem:   Cellulitis Active Problems:   Asthma   Atopic eczema   Sepsis    Gregory Phillips. is a 59 y.o. male with chronic lymphedema and history of at least 5 admissions for sialitis also with eczema, now admitted again with a nonpurulent cellulitis.  #1 Cellulitis in a patient with chronic lymphedema: He certainly has improved since admission having been given various antibiotics including cefazolin vancomycin vancomycin and clindamycin. He is very emphatic that cefazolin is the best antibiotic for him.  I had an extensive and exhaustive conversation with him with regards to which antibiotics he had been given in the past and present would cover which microbes. We had extensive discussions with regards to narrow versus broader therapy and in the and I agreed to narrow his therapy to cefazolin. I feel this is reasonable given that he has a nonpurulent cellulitis. I did warn him however that if we narrowed his therapy and he worsened the narrowed therapy would be blamed and he would likely go back to more broad antimicrobial therapy.      I told him to matter what he was going to need to be patient and the elevation of his leg above his heart was going to be critical.  I spent greater than 60  minutes with the patient including greater than 50% of time in face to face counsel of the patient with an exhaustive discussion about antimicrobial choices and in coordination of their care.    LOS: 2 days   Alcide Evener 08/11/2014, 7:08 PM

## 2014-08-11 NOTE — Progress Notes (Addendum)
[facility] Progress Note   Gregory Phillips. NTZ:[id number] DOB: 04-Apr-1955 DOA: 08/09/2014 PCP: Unice Cobble, MD  Brief narrative: Gregory Schriver. is a 59 y.o. male with chronic lymphedema of the left leg, asthma, severe eczema and multiple episodes of left leg cellulitis who presents to the hospital for left leg cellulitis once again. It started with fullness and pain in left groin and was followed by a rapidly progressive erythema and swelling of the foot and leg up to the knee. He had a fever of 101.4.     Subjective: Erythema and left leg continues to improve. No fevers, chills, nausea, vomiting diarrhea.   Assessment/Plan: Principal Problem:   Cellulitis/  Sepsis - left leg- cont Vanc and Clindamycin which appears to be working well- leukocytosis and fever esolved- erythema improving. -The patient was evaluated by ID yesterday per his request   Active Problems:   Asthma - cont Advair    Atopic eczema - cont topical ointments   Code Status: full code  Disposition Plan: home when stable DVT prophylaxis: lovenox Consultants:ID Procedures:  Antibiotics: Anti-infectives    Start     Dose/Rate Route Frequency Ordered Stop   08/09/14 2330  ceFAZolin (ANCEF) IVPB 1 g/50 mL premix  Status:  Discontinued     1 g 100 mL/hr over 30 Minutes Intravenous Every 8 hours 08/09/14 1627 08/09/14 1627   08/09/14 2200  clindamycin (CLEOCIN) IVPB 600 mg     600 mg 100 mL/hr over 30 Minutes Intravenous 3 times per day 08/09/14 1620     08/09/14 1800  vancomycin (VANCOCIN) IVPB 750 mg/150 ml premix     750 mg 150 mL/hr over 60 Minutes Intravenous Every 12 hours 08/09/14 1643     08/09/14 1530  ceFAZolin (ANCEF) IVPB 1 g/50 mL premix     1 g 100 mL/hr over 30 Minutes Intravenous  Once 08/09/14 1521 08/09/14 1621   08/09/14 1530  clindamycin (CLEOCIN) IVPB 600 mg     600 mg 100 mL/hr over 30 Minutes Intravenous  Once 08/09/14 1521 08/09/14 1656       Objective: Filed Weights   08/10/14 0500  Weight: 76.3 kg (168 lb 3.4 oz)    Intake/Output Summary (Last 24 hours) at 08/11/14 1200 Last data filed at 08/11/14 0849  Gross per 24 hour  Intake   1520 ml  Output   1050 ml  Net    470 ml     Vitals Filed Vitals:   08/10/14 2231 08/11/14 0558 08/11/14 0629 08/11/14 0846  BP:  97/59 112/70   Pulse: 71  64   Temp:  98 F (36.7 C) 98 F (36.7 C)   TempSrc:  Oral Oral   Resp: 18 18 18    Height:      Weight:      SpO2: 97%  95% 97%    Exam:  General:  Pt is alert, not in acute distress  HEENT: No icterus, No thrush  Cardiovascular: regular rate and rhythm, S1/S2 No murmur  Respiratory: clear to auscultation bilaterally   Abdomen: Soft, +Bowel sounds, non tender, non distended, no guarding  MSK: No LE cyanosis or clubbing- left leg edema, no significant tenderness  Skin: Mild erythema from left knee to toes  Data Reviewed: Basic Metabolic Panel:  Recent Labs Lab 08/09/14 1158 08/10/14 0423 08/11/14 0651  NA 140 141 138  K 3.8 3.8 4.2  CL 102 107 103  CO2 28 27 27   GLUCOSE 109* 112* 115*  BUN 12 11 9   CREATININE 1.03 0.89 0.72  CALCIUM 9.2 8.4* 8.3*   Liver Function Tests:  Recent Labs Lab 08/09/14 1158  AST 26  ALT 25  ALKPHOS 63  BILITOT 1.4*  PROT 7.1  ALBUMIN 3.4*   No results for input(s): LIPASE, AMYLASE in the last 168 hours. No results for input(s): AMMONIA in the last 168 hours. CBC:  Recent Labs Lab 08/09/14 1158 08/10/14 0423 08/11/14 0651  WBC 15.9* 9.9 8.0  NEUTROABS 14.0*  --   --   HGB 15.3 14.0 14.3  HCT 45.1 41.7 43.1  MCV 90.9 90.7 90.7  PLT 207 206 214   Cardiac Enzymes: No results for input(s): CKTOTAL, CKMB, CKMBINDEX, TROPONINI in the last 168 hours. BNP (last 3 results) No results for input(s): BNP in the last 8760 hours.  ProBNP (last 3 results) No results for input(s): PROBNP in the last 8760 hours.  CBG: No results for input(s): GLUCAP in the last  168 hours.  Recent Results (from the past 240 hour(s))  MRSA PCR Screening     Status: None   Collection Time: 08/09/14 11:19 PM  Result Value Ref Range Status   MRSA by PCR NEGATIVE NEGATIVE Final    Comment:        The GeneXpert MRSA Assay (FDA approved for NASAL specimens only), is one component of a comprehensive MRSA colonization surveillance program. It is not intended to diagnose MRSA infection nor to guide or monitor treatment for MRSA infections.      Studies: No results found.  Scheduled Meds:  Scheduled Meds: . betamethasone dipropionate  1 application Topical QHS  . clindamycin (CLEOCIN) IV  600 mg Intravenous 3 times per day  . enoxaparin (LOVENOX) injection  40 mg Subcutaneous Q24H  . mometasone-formoterol  2 puff Inhalation BID  . vancomycin  750 mg Intravenous Q12H   Continuous Infusions:   Time spent on care of this patient: 30 min   [place], MD 08/11/2014, 12:00 PM  LOS: 2 days   [facility] Office  [phone] Pager - Text Page per [email or web address]  If 7PM-7AM, please contact night-coverage [email or web address]

## 2014-08-12 DIAGNOSIS — L03116 Cellulitis of left lower limb: Secondary | ICD-10-CM | POA: Insufficient documentation

## 2014-08-12 DIAGNOSIS — I890 Lymphedema, not elsewhere classified: Secondary | ICD-10-CM

## 2014-08-12 LAB — BASIC METABOLIC PANEL
ANION GAP: 8 (ref 5–15)
BUN: 10 mg/dL (ref 6–20)
CO2: 27 mmol/L (ref 22–32)
CREATININE: 0.86 mg/dL (ref 0.61–1.24)
Calcium: 9.1 mg/dL (ref 8.9–10.3)
Chloride: 103 mmol/L (ref 101–111)
GFR calc non Af Amer: >60 mL/min (ref >60)
Glucose, Bld: 100 mg/dL — ABNORMAL HIGH (ref 65–99)
Potassium: 3.8 mmol/L (ref 3.5–5.1)
Sodium: 138 mmol/L (ref 135–145)

## 2014-08-12 LAB — CBC
HEMATOCRIT: 45.2 % (ref 39.0–52.0)
HEMOGLOBIN: 14.9 g/dL (ref 13.0–17.0)
MCH: 29.8 pg (ref 26.0–34.0)
MCHC: 33 g/dL (ref 30.0–36.0)
MCV: 90.4 fL (ref 78.0–100.0)
Platelets: 259 10*3/uL (ref 150–400)
RBC: 5 MIL/uL (ref 4.22–5.81)
RDW: 13.4 % (ref 11.5–15.5)
WBC: 7.8 10*3/uL (ref 4.0–10.5)

## 2014-08-12 MED ORDER — CEPHALEXIN 500 MG PO CAPS: 500.0000 mg | ORAL_CAPSULE | Freq: Four times a day (QID) | ORAL | Status: DC

## 2014-08-12 MED ORDER — ORITAVANCIN DIPHOSPHATE 400 MG IV SOLR
1200.0000 mg | Freq: Once | INTRAVENOUS | Status: AC
  Administered 2014-08-12: 1200 mg via INTRAVENOUS
  Filled 2014-08-12: qty 120

## 2014-08-12 NOTE — Progress Notes (Signed)
ANTIBIOTIC CONSULT NOTE - INITIAL  Pharmacy Consult for oritavancin Indication: Cellulitis  No Known Allergies  Patient Measurements: Height: 5\' 6"  (167.6 cm) Weight: 168 lb 3.4 oz (76.3 kg) IBW/kg (Calculated) : 63.8 Adjusted Body Weight:   Vital Signs: Temp: 98.5 F (36.9 C) (05/22 1333) Temp Source: Oral (05/22 1333) BP: 129/73 mmHg (05/22 1333) Pulse Rate: 70 (05/22 1333) Intake/Output from previous day: 05/21 0701 - 05/22 0700 In: 1362 [P.O.:1062; IV Piggyback:300] Out: 775 [Urine:775] Intake/Output from this shift: Total I/O In: 718 [P.O.:718] Out: 900 [Urine:900]  Labs:  Recent Labs  08/10/14 0423 08/11/14 0651 08/12/14 0551  WBC 9.9 8.0 7.8  HGB 14.0 14.3 14.9  PLT 206 214 259  CREATININE 0.89 0.72 0.86   Estimated Creatinine Clearance: 86.6 mL/min (by C-G formula based on Cr of 0.86). No results for input(s): VANCOTROUGH, VANCOPEAK, VANCORANDOM, GENTTROUGH, GENTPEAK, GENTRANDOM, TOBRATROUGH, TOBRAPEAK, TOBRARND, AMIKACINPEAK, AMIKACINTROU, AMIKACIN in the last 72 hours.   Microbiology: Recent Results (from the past 720 hour(s))  MRSA PCR Screening     Status: None   Collection Time: 08/09/14 11:19 PM  Result Value Ref Range Status   MRSA by PCR NEGATIVE NEGATIVE Final    Comment:        The GeneXpert MRSA Assay (FDA approved for NASAL specimens only), is one component of a comprehensive MRSA colonization surveillance program. It is not intended to diagnose MRSA infection nor to guide or monitor treatment for MRSA infections.     Medical History: Past Medical History  Diagnosis Date  . Hyperlipidemia   . Asthma   . Eczema   . Cellulitis 2001     LLE ; [facility]  . Cellulitis 2007    R elbow ; S/ P drainage by Dr Telford Nab  . Edema   . Cellulitis 2014    with sepsis  . Testicular cancer 2004    Dr Jeffie Pollock  . Cellulitis 08/09/2014    LLE    Medications:  Scheduled:  . betamethasone dipropionate  1 application Topical QHS   .  ceFAZolin (ANCEF) IV  2 g Intravenous Q8H  . enoxaparin (LOVENOX) injection  40 mg Subcutaneous Q24H  . mometasone-formoterol  2 puff Inhalation BID  . oritavancin (ORBACTIV) IVPB  1,200 mg Intravenous Once   Infusions:   Assessment: 59 yo who has been here for recurrent non-purulent cellulitis. He has been on multiple abx in the past.  The plan is to use oritavancin x1 for this issue. He is currently on ancef.   Plan:   Dc ancef Oritavancin 1200mg  IV Wende Mott, PharmD Pager: [phone] 08/12/2014 2:24 PM

## 2014-08-12 NOTE — Progress Notes (Signed)
Patient discharge teaching given, including activity, diet, follow-up appoints, and medications. Patient verbalized understanding of all discharge instructions. IV access was d/c'd. Vitals are stable. Skin is intact except as charted in most recent assessments. Pt to be escorted out by RN, to be driven home by family.  

## 2014-08-12 NOTE — Progress Notes (Signed)
[facility]         Subjective:  Patient really wanted to be back on cefazolin as he thinks this works best for him.  Note when I came by earlier the patient was not in his room but apparently walking off the floor. I informed him that walking at a great deal while good for DVT prophylaxis was not good for making cellulitis looked better quickly. Rather leg elevation above the heart is important to making cellulitis improve more rapidly.   Antibiotics:  Anti-infectives    Start     Dose/Rate Route Frequency Ordered Stop   08/12/14 1500  Oritavancin Diphosphate (ORBACTIV) 1,200 mg in dextrose 5 % IVPB     1,200 mg 333.3 mL/hr over 180 Minutes Intravenous Once 08/12/14 1418     08/11/14 1800  ceFAZolin (ANCEF) IVPB 2 g/50 mL premix  Status:  Discontinued     2 g 100 mL/hr over 30 Minutes Intravenous Every 8 hours 08/11/14 1733 08/12/14 1424   08/09/14 2330  ceFAZolin (ANCEF) IVPB 1 g/50 mL premix  Status:  Discontinued     1 g 100 mL/hr over 30 Minutes Intravenous Every 8 hours 08/09/14 1627 08/09/14 1627   08/09/14 2200  clindamycin (CLEOCIN) IVPB 600 mg  Status:  Discontinued     600 mg 100 mL/hr over 30 Minutes Intravenous 3 times per day 08/09/14 1620 08/11/14 1733   08/09/14 1800  vancomycin (VANCOCIN) IVPB 750 mg/150 ml premix  Status:  Discontinued     750 mg 150 mL/hr over 60 Minutes Intravenous Every 12 hours 08/09/14 1643 08/11/14 1733   08/09/14 1530  ceFAZolin (ANCEF) IVPB 1 g/50 mL premix     1 g 100 mL/hr over 30 Minutes Intravenous  Once 08/09/14 1521 08/09/14 1621   08/09/14 1530  clindamycin (CLEOCIN) IVPB 600 mg     600 mg 100 mL/hr over 30 Minutes Intravenous  Once 08/09/14 1521 08/09/14 1656      Medications: Scheduled Meds: . betamethasone dipropionate  1 application Topical QHS  . enoxaparin (LOVENOX) injection  40 mg Subcutaneous Q24H  . mometasone-formoterol  2 puff Inhalation BID  . oritavancin (ORBACTIV) IVPB   1,200 mg Intravenous Once   Continuous Infusions:  PRN Meds:.acetaminophen **OR** acetaminophen, bisacodyl, HYDROcodone-acetaminophen, ondansetron **OR** ondansetron (ZOFRAN) IV, polyethylene glycol    Objective: Weight change:   Intake/Output Summary (Last 24 hours) at 08/12/14 1508 Last data filed at 08/12/14 1334  Gross per 24 hour  Intake   1448 ml  Output   1675 ml  Net   -227 ml   Blood pressure 129/73, pulse 70, temperature 98.5 F (36.9 C), temperature source Oral, resp. rate 18, height 5\' 6"  (1.676 m), weight 168 lb 3.4 oz (76.3 kg), SpO2 97 %. Temp:  [97.9 F (36.6 C)-98.5 F (36.9 C)] 98.5 F (36.9 C) (05/22 1333) Pulse Rate:  [63-76] 70 (05/22 1333) Resp:  [17-18] 18 (05/22 1333) BP: (117-134)/(64-80) 129/73 mmHg (05/22 1333) SpO2:  [96 %-99 %] 97 % (05/22 1333)  Physical Exam: General: Alert and awake, oriented x3, not in any acute distress. HEENT: anicteric sclera, pupils reactive to light and accommodation, EOMI CVS regular rate Chest: no wheezing, r Abdomen:  nondistended,   Extremities:   08/11/2014:      5/222/16      Neuro: nonfocal  CBC:  CBC Latest Ref Rng 08/12/2014 08/11/2014 08/10/2014  WBC 4.0 - 10.5 K/uL 7.8 8.0 9.9  Hemoglobin 13.0 - 17.0 g/dL 14.9  14.3 14.0  Hematocrit 39.0 - 52.0 % 45.2 43.1 41.7  Platelets 150 - 400 K/uL 259 214 206       BMET  Recent Labs  08/11/14 0651 08/12/14 0551  NA 138 138  K 4.2 3.8  CL 103 103  CO2 27 27  GLUCOSE 115* 100*  BUN 9 10  CREATININE 0.72 0.86  CALCIUM 8.3* 9.1     Liver Panel  No results for input(s): PROT, ALBUMIN, AST, ALT, ALKPHOS, BILITOT, BILIDIR, IBILI in the last 72 hours.     Sedimentation Rate No results for input(s): ESRSEDRATE in the last 72 hours. C-Reactive Protein No results for input(s): CRP in the last 72 hours.  Micro Results: Recent Results (from the past 720 hour(s))  MRSA PCR Screening     Status: None   Collection Time: 08/09/14 11:19 PM    Result Value Ref Range Status   MRSA by PCR NEGATIVE NEGATIVE Final    Comment:        The GeneXpert MRSA Assay (FDA approved for NASAL specimens only), is one component of a comprehensive MRSA colonization surveillance program. It is not intended to diagnose MRSA infection nor to guide or monitor treatment for MRSA infections.     Studies/Results: No results found.    Assessment/Plan:  Principal Problem:   Cellulitis Active Problems:   Asthma   Atopic eczema   Sepsis   Lymphedema    Cristina Mattern. is a 59 y.o. male with chronic lymphedema and history of at least 5 admissions for sialitis also with eczema, now admitted again with a nonpurulent cellulitis.  #1 Cellulitis in a patient with chronic lymphedema:   We again had extensive discussions about various options for treatment for his cellulitis. He was not comfortable with the idea of going home on oral cephalexin today.  I had extensive discussion with him and his wife with regards the possibility of giving him a one-time dose of ORITAVANCIN which would be similarly to him being on IV antibiotics for the next 2 weeks as this is a long-acting agent with a very long half-life.  Ultimately he felt comfortable with that option. The ORITAVANCIN is active vs MRSA, MSSA, Streptococcal species so he should NOT need to take any oral antibiotics during the next 2 weeks.  I would like him to also have a prescription for Keflex 500 mg 4 times daily with a two-week supply with multiple refills. He is to fill this and have this on hand for the future so that he has an active antibiotic that he can start at the first signs of cellulitis developing.   I spent greater than 60  minutes with the patient including greater than 50% of time in face to face counsel of the patient with an exhaustive discussion about antimicrobial choices and in coordination of their care.  He can he safely discharged to home after his dose of  long-acting ORITAVANCIN with followup in [facility] with Dr. Baxter Flattery, myself, Dr. Johnnye Sima, Dr> Megan Salon or Dr Linus Salmons in the next 2-3 weeks  I will sign off for now please call with further questions.   LOS: 3 days   Alcide Evener 08/12/2014, 3:08 PM

## 2014-08-12 NOTE — Discharge Summary (Signed)
Physician Discharge Summary  Kearney Hard. POE:[id number] DOB: 1956-02-27 DOA: 08/09/2014  PCP: Unice Cobble, MD  Admit date: 08/09/2014 Discharge date: 08/12/2014  Time spent: 50 minutes    Discharge Condition: stable Diet recommendation: heart healthy  Discharge Diagnoses:  Principal Problem:   Cellulitis Active Problems:   Asthma   Atopic eczema   Sepsis   Lymphedema   Cellulitis of left lower extremity   History of present illness:  Gregory Phillips. is a 59 y.o. male with chronic lymphedema of the left leg, asthma, severe eczema and multiple episodes of left leg cellulitis who presents to the hospital for left leg cellulitis once again. It started with fullness and pain in left groin and was followed by a rapidly progressive erythema and swelling of the foot and leg up to the knee. He had a fever of 101.4.   Hospital Course:  Principal Problem:  Cellulitis/ Sepsis - recurrent left leg cellulitis in related to severe eczema and chronic lymphedema of the left leg- initially started on Vanc and Clindamycin which appeared to be working well- leukocytosis and fever resolved- erythema improving. -The patient requested an ID eval - he requested be transitioned to Cefazolin as it has worked well for him in the past- this was done by Dr Tommy Medal on 5/21-  He has given the patient a dose of Oritavancin today and is recommending that I give him a prescription for Keflex if/ when he is to develop another episode of cellulitis which I have done.   Active Problems:  Chronic Lymphedema left lower extremity   Asthma - cont Advair   Atopic eczema - cont topical ointments    Consultations:  ID  Discharge Exam: Filed Weights   08/10/14 0500  Weight: 76.3 kg (168 lb 3.4 oz)   Filed Vitals:   08/12/14 1333  BP: 129/73  Pulse: 70  Temp: 98.5 F (36.9 C)  Resp: 18    General: AAO x 3, no distress Cardiovascular: RRR, no murmurs  Respiratory: clear to  auscultation bilaterally GI: soft, non-tender, non-distended, bowel sound positive  Discharge Instructions You were cared for by a hospitalist during your hospital stay. If you have any questions about your discharge medications or the care you received while you were in the hospital after you are discharged, you can call the unit and asked to speak with the hospitalist on call if the hospitalist that took care of you is not available. Once you are discharged, your primary care physician will handle any further medical issues. Please note that NO REFILLS for any discharge medications will be authorized once you are discharged, as it is imperative that you return to your primary care physician (or establish a relationship with a primary care physician if you do not have one) for your aftercare needs so that they can reassess your need for medications and monitor your lab values.      Discharge Instructions    Diet - low sodium heart healthy    Complete by:  As directed      Increase activity slowly    Complete by:  As directed             Medication List    TAKE these medications        ADVAIR DISKUS 100-50 MCG/DOSE Aepb  Generic drug:  Fluticasone-Salmeterol  INHALE 1 PUFF BY MOUTH TWICE DAILY     betamethasone dipropionate 0.05 % ointment  Commonly known as:  DIPROLENE  Apply 1  application topically at bedtime.     cephALEXin 500 MG capsule  Commonly known as:  KEFLEX  Take 1 capsule (500 mg total) by mouth 4 (four) times daily.     desonide 0.05 % ointment  Commonly known as:  DESOWEN  Apply 1 application topically at bedtime.     ranitidine 150 MG tablet  Commonly known as:  ZANTAC  Take 150 mg by mouth daily as needed.     VIAGRA 100 MG tablet  Generic drug:  sildenafil  TAKE ONE TABLET BY MOUTH AS NEEDED FOR ERECTILE DYSFUNCTION.       No Known Allergies    The results of significant diagnostics from this hospitalization (including imaging, microbiology,  ancillary and laboratory) are listed below for reference.    Significant Diagnostic Studies: No results found.  Microbiology: Recent Results (from the past 240 hour(s))  MRSA PCR Screening     Status: None   Collection Time: 08/09/14 11:19 PM  Result Value Ref Range Status   MRSA by PCR NEGATIVE NEGATIVE Final    Comment:        The GeneXpert MRSA Assay (FDA approved for NASAL specimens only), is one component of a comprehensive MRSA colonization surveillance program. It is not intended to diagnose MRSA infection nor to guide or monitor treatment for MRSA infections.      Labs: Basic Metabolic Panel:  Recent Labs Lab 08/09/14 1158 08/10/14 0423 08/11/14 0651 08/12/14 0551  NA 140 141 138 138  K 3.8 3.8 4.2 3.8  CL 102 107 103 103  CO2 28 27 27 27   GLUCOSE 109* 112* 115* 100*  BUN 12 11 9 10   CREATININE 1.03 0.89 0.72 0.86  CALCIUM 9.2 8.4* 8.3* 9.1   Liver Function Tests:  Recent Labs Lab 08/09/14 1158  AST 26  ALT 25  ALKPHOS 63  BILITOT 1.4*  PROT 7.1  ALBUMIN 3.4*   No results for input(s): LIPASE, AMYLASE in the last 168 hours. No results for input(s): AMMONIA in the last 168 hours. CBC:  Recent Labs Lab 08/09/14 1158 08/10/14 0423 08/11/14 0651 08/12/14 0551  WBC 15.9* 9.9 8.0 7.8  NEUTROABS 14.0*  --   --   --   HGB 15.3 14.0 14.3 14.9  HCT 45.1 41.7 43.1 45.2  MCV 90.9 90.7 90.7 90.4  PLT 207 206 214 259   Cardiac Enzymes: No results for input(s): CKTOTAL, CKMB, CKMBINDEX, TROPONINI in the last 168 hours. BNP: BNP (last 3 results) No results for input(s): BNP in the last 8760 hours.  ProBNP (last 3 results) No results for input(s): PROBNP in the last 8760 hours.  CBG: No results for input(s): GLUCAP in the last 168 hours.     SignedDebbe Odea, MD [facility] 08/12/2014, 5:11 PM

## 2014-08-13 LAB — HIV ANTIBODY (ROUTINE TESTING W REFLEX): HIV SCREEN 4TH GENERATION: NONREACTIVE

## 2014-08-13 LAB — HEMOGLOBIN A1C
HEMOGLOBIN A1C: 5.7 % — AB (ref 4.8–5.6)
MEAN PLASMA GLUCOSE: 117 mg/dL

## 2014-08-15 ENCOUNTER — Ambulatory Visit (INDEPENDENT_AMBULATORY_CARE_PROVIDER_SITE_OTHER): Payer: 59 | Admitting: Infectious Disease

## 2014-08-15 ENCOUNTER — Encounter: Payer: Self-pay | Admitting: Infectious Disease

## 2014-08-15 ENCOUNTER — Telehealth: Payer: Self-pay | Admitting: *Deleted

## 2014-08-15 VITALS — BP 127/87 | HR 67 | Temp 97.9°F | Wt 166.0 lb

## 2014-08-15 DIAGNOSIS — L03119 Cellulitis of unspecified part of limb: Secondary | ICD-10-CM

## 2014-08-15 DIAGNOSIS — L03116 Cellulitis of left lower limb: Secondary | ICD-10-CM | POA: Diagnosis not present

## 2014-08-15 DIAGNOSIS — I890 Lymphedema, not elsewhere classified: Secondary | ICD-10-CM

## 2014-08-15 DIAGNOSIS — I809 Phlebitis and thrombophlebitis of unspecified site: Secondary | ICD-10-CM | POA: Diagnosis not present

## 2014-08-15 HISTORY — DX: Cellulitis of unspecified part of limb: L03.119

## 2014-08-15 HISTORY — DX: Phlebitis and thrombophlebitis of unspecified site: I80.9

## 2014-08-15 LAB — HEPATITIS C ANTIBODY: HCV Ab: <0.1 s/co ratio — AB (ref 0.0–0.9)

## 2014-08-15 MED ORDER — CIPROFLOXACIN HCL 500 MG PO TABS: 500.0000 mg | ORAL_TABLET | Freq: Two times a day (BID) | ORAL | Status: DC

## 2014-08-15 NOTE — Telephone Encounter (Signed)
Patient called stating his right forearm is red and swollen and feels it is from his previous picc line. He is requesting to be seen today and per Dr. Tommy Medal given a work in appt for 4:00 pm. Myrtis Hopping

## 2014-08-15 NOTE — Progress Notes (Signed)
Subjective:    Patient ID: Gregory Phillips., male    DOB: [birth year], 57 y.o.   MRN: [id number]  HPI  59 y.o. male with chronic lymphedema and history of at least 5 admissions for sialitis also with eczema, now admitted again with a nonpurulent cellulitis.  He was initially placed on vancomycin and clindamycin by Dr. Johnnye Sima then after extensive discussion wanted to be placed on cefazolin, then when he was doing sufficiently well to be dc on oral keflex but did not wish to go home we gave him a dose of ORITAVANCIN which will last him for more than 2 weeks.  He called today insisting on being seen despite the fact that clinic was substantially overbooked. I worked him in to clinic for this complaint.  Apparently he had developed an infiltrate in right arm at site of prior IV site. IV was switched to opposite side but that site also stopped working during ORITAVANCIN infusion and it was switched to the arm where he had original infiltrate. He developed substantial redness and swelling at the site of the former PIV with findings on exam concerning for thrombophlebitis possibly complicated by infection with swollen tender area. We endeavored to obtain Duplex tonight but this could not be obtained outside of ER until tomorrow am. I offered I and D tonight but he preferred to proceed with abx in the meantime and then followup with PCP and or CCS for potential I and D should he require one.  Review of Systems  Constitutional: Negative for fever, chills, diaphoresis, activity change, appetite change, fatigue and unexpected weight change.  HENT: Negative for congestion, rhinorrhea, sinus pressure, sneezing, sore throat and trouble swallowing.   Eyes: Negative for photophobia and visual disturbance.  Respiratory: Negative for cough, chest tightness, shortness of breath, wheezing and stridor.   Cardiovascular: Negative for chest pain, palpitations and leg swelling.  Gastrointestinal: Negative for  nausea, vomiting, abdominal pain, diarrhea, constipation, blood in stool, abdominal distention and anal bleeding.  Genitourinary: Negative for dysuria, hematuria, flank pain and difficulty urinating.  Musculoskeletal: Negative for myalgias, back pain, joint swelling, arthralgias and gait problem.  Skin: Positive for color change and wound. Negative for pallor and rash.  Neurological: Negative for dizziness, tremors, weakness and light-headedness.  Hematological: Negative for adenopathy. Does not bruise/bleed easily.  Psychiatric/Behavioral: Negative for behavioral problems, confusion, sleep disturbance, dysphoric mood, decreased concentration and agitation.       Objective:   Physical Exam  Constitutional: He is oriented to person, place, and time. He appears well-developed and well-nourished.  HENT:  Head: Normocephalic and atraumatic.  Eyes: Conjunctivae and EOM are normal.  Neck: Normal range of motion. Neck supple.  Cardiovascular: Normal rate and regular rhythm.   Pulmonary/Chest: Effort normal. No respiratory distress. He has no wheezes.  Abdominal: Soft. He exhibits no distension.  Musculoskeletal: Normal range of motion. He exhibits no edema or tenderness.  Neurological: He is alert and oriented to person, place, and time.  Skin: Skin is warm and dry. There is erythema.  Psychiatric: His mood appears anxious.   LLE  08/11/14:          5/22:16      5/25:16     LUE 525/16:            Assessment & Plan:   Thrombophlebitis +/- septic thrombophlebitis:  --obtain doppler tomorrow am if appears to be complicated case of septic thromblophebitis will admit and ask VVS to see --if he has a DVT will  admit and have anticoagulated --I have stared oral cipro 500mg  bid to cover GNR NOT covered by the ORITOVANCIN  --he may need I and D by Primary Care, CCS or ED if he has abscess without thrombus that is in the soft tissue alone.  Recurrent cellulitis of LLE  due to lymphedema: this area seems dramatically improved with ORITAVANCIN and should continue to improve. I do not know what his baseline level of erythema is. I would like him to have keflex that is filled and not expired so he can start it as earliest sings of cellulitis   We spent greater than 25  minutes with the patient including greater than 50% of time in face to face counsel of the patient  Re septic thombophlebitis, DVT, soft tissue abscess and in coordination of their care with vascular lab

## 2014-08-16 ENCOUNTER — Ambulatory Visit (HOSPITAL_COMMUNITY)
Admission: RE | Admit: 2014-08-16 | Discharge: 2014-08-16 | Disposition: A | Discharge status: Home / Self Care | Payer: 59 | Source: Ambulatory Visit | Attending: Infectious Disease | Admitting: Infectious Disease

## 2014-08-16 DIAGNOSIS — I809 Phlebitis and thrombophlebitis of unspecified site: Secondary | ICD-10-CM

## 2014-08-16 DIAGNOSIS — I82611 Acute embolism and thrombosis of superficial veins of right upper extremity: Secondary | ICD-10-CM | POA: Insufficient documentation

## 2014-08-16 DIAGNOSIS — M79631 Pain in right forearm: Secondary | ICD-10-CM | POA: Diagnosis present

## 2014-08-16 NOTE — Progress Notes (Signed)
Right Upper Ext. Venous Duplex Completed.  PRELIMINARY RESULTS BY TECH - Negative for deep vein thrombosis in the right arm. Positive for superficial vein thrombosis in the right cephalic vein from the proximal forearm extending into the distal forearm.  Dr. Tommy Medal was given these results.  Oda Cogan, BS, RDMS, RVT

## 2014-08-23 ENCOUNTER — Ambulatory Visit (INDEPENDENT_AMBULATORY_CARE_PROVIDER_SITE_OTHER): Payer: 59 | Admitting: Internal Medicine

## 2014-08-23 ENCOUNTER — Encounter: Payer: Self-pay | Admitting: Internal Medicine

## 2014-08-23 VITALS — BP 118/76 | HR 69 | Temp 98.3°F | Wt 169.0 lb

## 2014-08-23 DIAGNOSIS — L309 Dermatitis, unspecified: Secondary | ICD-10-CM | POA: Diagnosis not present

## 2014-08-23 DIAGNOSIS — E782 Mixed hyperlipidemia: Secondary | ICD-10-CM | POA: Diagnosis not present

## 2014-08-23 DIAGNOSIS — C6211 Malignant neoplasm of descended right testis: Secondary | ICD-10-CM

## 2014-08-23 DIAGNOSIS — L659 Nonscarring hair loss, unspecified: Secondary | ICD-10-CM | POA: Insufficient documentation

## 2014-08-23 DIAGNOSIS — L209 Atopic dermatitis, unspecified: Secondary | ICD-10-CM

## 2014-08-23 MED ORDER — BETAMETHASONE DIPROPIONATE 0.05 % EX OINT: 1.0000 "application " | TOPICAL_OINTMENT | Freq: Every day | CUTANEOUS | Status: DC

## 2014-08-23 MED ORDER — FLUTICASONE-SALMETEROL 100-50 MCG/DOSE IN AEPB: 1.0000 | INHALATION_SPRAY | Freq: Two times a day (BID) | RESPIRATORY_TRACT | Status: DC

## 2014-08-23 MED ORDER — DESONIDE 0.05 % EX OINT: TOPICAL_OINTMENT | CUTANEOUS | Status: DC

## 2014-08-23 NOTE — Assessment & Plan Note (Signed)
PSA Testosterone level

## 2014-08-23 NOTE — Progress Notes (Signed)
Pre visit review using our clinic review tool, if applicable. No additional management support is needed unless otherwise documented below in the visit note. 

## 2014-08-23 NOTE — Assessment & Plan Note (Addendum)
NMR  Were testosterone replacement seemingly necessary; it will be imperative to reduce the lipid risk

## 2014-08-23 NOTE — Progress Notes (Signed)
Subjective:    Patient ID: Gregory Phillips., male    DOB: March 19, 1956, 59 y.o.   MRN: [id number]  HPI The patient is here to assess status of active health conditions.  PMH, FH, & Social History reviewed & updated.  He was hospitalized 5/19-5/22/15 with cellulitis of the left lower extremity and septic thrombophlebitis in the right forearm. He is completing Cipro. He is not having fever, chills, or sweats at this time. Labs were reviewed; glucose was high as 115; but his A1c was nondiabetic at 5.7%. He did have mild elevation of total bilirubin at 1.4 suggesting Gilbert syndrome.  He has had hair loss and loss of the eyebrows over the last 12 months. He also describes decreased muscle tone. Despite using Viagra he still has issues with ED. Significant past history includes resection of a seminoma in 2005 by Dr. Jeffie Pollock. He had no chemotherapy or radiation following that surgery. The last PSA on record was 0.5. His father did have hepatic and prostate malignancy.  He is questioning entering a  trial for an agent to treat atopic dermatitis. He has an established relationship with Dr. Beverly Gust at [facility] and will contact him.  He has never smoked. He has 12 beers per week. He had been walking 60 minutes 2 times a week prior to developing cellulitis. He does eat red meat on a regular basis. He is not on a statin; an advance cholesterol test in 2008 revealed an LDL of 160 with 2069 total particles and 1228 small dense particles. His LDL goal would be less than 100, ideally less than 70. There is no family history of myocardial infarction;his mother had a TIA in her 32s.  His asthma is well controlled with maintenance Advair. He does not use his rescue inhaler.  Review of Systems He does have some cold intolerance at times.  Chest pain, palpitations, tachycardia, exertional dyspnea, paroxysmal nocturnal dyspnea, claudication or edema are absent. No unexplained weight loss, abdominal pain,  significant dyspepsia, dysphagia, melena, rectal bleeding, or persistently small caliber stools. Dysuria, pyuria, hematuria, frequency, nocturia or polyuria are denied.  No bowel changes of constipation or diarrhea      Objective:   Physical Exam  Pertinent or positive findings include: Significant alopecia is not present. There is thinning of the eyebrows laterally. Scarring of tympanic membranes, the right > the left. He has diffuse low-grade eczematoid rash on the trunk and extremities. There are nail deformities bilaterally. Small granulomas present over the vein of the right forearm. He has edema of the left lower extremity; he's wearing support hose. He has crepitus of the knees.  General appearance :adequately nourished; in no distress. Eyes: No conjunctival inflammation or scleral icterus is present. Oral exam:  Lips and gums are healthy appearing.There is no oropharyngeal erythema or exudate noted. Dental hygiene is good. Heart:  Normal rate and regular rhythm. S1 and S2 normal without gallop, murmur, click, rub or other extra sounds   Lungs:Chest clear to auscultation; no wheezes, rhonchi,rales ,or rubs present.No increased work of breathing.  Abdomen: bowel sounds normal, soft and non-tender without masses, organomegaly or hernias noted.  No guarding or rebound.  Vascular : all pulses equal ; no bruits present. Skin:Warm & dry; no tenting or jaundice  Lymphatic: No lymphadenopathy is noted about the head, neck, axilla GU deferred to Dr Jeffie Pollock Neuro: Strength, tone & DTRs normal.         Assessment & Plan:  See Current Assessment & Plan in  Problem List under specific Diagnosis

## 2014-08-23 NOTE — Patient Instructions (Signed)
  Your next office appointment will be determined based upon review of your pending labs. Those written interpretation of the lab results and instructions will be transmitted to you by My Chart  Critical results will be called.   Followup as needed for any active or acute issue. Please report any significant change in your symptoms. 

## 2014-08-23 NOTE — Assessment & Plan Note (Signed)
TFT

## 2014-08-23 NOTE — Assessment & Plan Note (Signed)
He is questioning entering Dupilumab Trial  for atopic dermatitis; he'll contact Dr Robin Searing @ [facility]

## 2014-09-03 ENCOUNTER — Other Ambulatory Visit: Payer: Self-pay | Admitting: Internal Medicine

## 2014-09-03 ENCOUNTER — Other Ambulatory Visit (INDEPENDENT_AMBULATORY_CARE_PROVIDER_SITE_OTHER): Payer: 59

## 2014-09-03 DIAGNOSIS — L659 Nonscarring hair loss, unspecified: Secondary | ICD-10-CM | POA: Diagnosis not present

## 2014-09-03 DIAGNOSIS — E782 Mixed hyperlipidemia: Secondary | ICD-10-CM | POA: Diagnosis not present

## 2014-09-03 DIAGNOSIS — C6211 Malignant neoplasm of descended right testis: Secondary | ICD-10-CM

## 2014-09-03 LAB — PSA: PSA: 0.53 ng/mL (ref 0.10–4.00)

## 2014-09-03 LAB — T4, FREE: FREE T4: 0.73 ng/dL (ref 0.60–1.60)

## 2014-09-03 LAB — TSH: TSH: 2.78 u[IU]/mL (ref 0.35–4.50)

## 2014-09-03 LAB — TESTOSTERONE: TESTOSTERONE: 337.94 ng/dL (ref 300.00–890.00)

## 2014-09-05 LAB — NMR LIPOPROFILE WITH LIPIDS
CHOLESTEROL, TOTAL: 191 mg/dL (ref 100–199)
HDL PARTICLE NUMBER: 38 umol/L (ref >=30.5)
HDL Size: 9.1 nm — ABNORMAL LOW (ref >=9.2)
HDL-C: 63 mg/dL (ref >39)
LARGE HDL: 6.9 umol/L (ref >=4.8)
LARGE VLDL-P: 8.5 nmol/L — AB (ref <=2.7)
LDL (calc): 102 mg/dL — ABNORMAL HIGH (ref 0–99)
LDL Particle Number: 1391 nmol/L — ABNORMAL HIGH (ref <1000)
LDL Size: 21.4 nm (ref >=20.8)
LP-IR Score: 62 — ABNORMAL HIGH (ref <=45)
SMALL LDL PARTICLE NUMBER: 455 nmol/L (ref <=527)
Triglycerides: 129 mg/dL (ref 0–149)
VLDL Size: 58.9 nm — ABNORMAL HIGH (ref <=46.6)

## 2015-02-01 ENCOUNTER — Other Ambulatory Visit: Payer: Self-pay | Admitting: Internal Medicine

## 2015-04-08 ENCOUNTER — Other Ambulatory Visit: Payer: Self-pay | Admitting: Internal Medicine

## 2015-05-07 ENCOUNTER — Encounter: Payer: Self-pay | Admitting: Internal Medicine

## 2015-08-28 ENCOUNTER — Telehealth: Payer: Self-pay | Admitting: Family Medicine

## 2015-08-28 NOTE — Telephone Encounter (Signed)
Pt and wife would like to come in to have a CPE they are transferring from Dr. Linna Darner is it okay to use any new pt slot?

## 2015-08-29 NOTE — Telephone Encounter (Signed)
Please see message. Is it okay?

## 2015-08-29 NOTE — Telephone Encounter (Signed)
Can Dr. Martinique see them? I am starting to get backed up on having available slots for my current patients. If they were to see me would want 8 15, 8 45, 1 15 1  45 slot only and would need to be for establish visit, not CPE until I have gone through medical history- appears to be some complexity to his background.

## 2015-08-30 NOTE — Telephone Encounter (Signed)
Dr Yong Channel this pt and wife only wanted to see a male MD would you consider them

## 2015-08-30 NOTE — Telephone Encounter (Signed)
Please see message. °

## 2015-08-31 NOTE — Telephone Encounter (Signed)
I see they have been scheduled in September with me. These will not be physicals- establish visits only. I am willing to accept though at time of that visit. Lab visit should be cancelled as I am unsure if I will be able to do physical and do not know how charges will be processed if billed preventative but I do not do a physical  Thanks for your help

## 2015-09-03 NOTE — Telephone Encounter (Signed)
Pt is aware no cpx labs just est appt

## 2015-09-03 NOTE — Telephone Encounter (Signed)
lmom for pt to call back

## 2015-09-16 ENCOUNTER — Other Ambulatory Visit: Payer: Self-pay | Admitting: Internal Medicine

## 2015-12-02 ENCOUNTER — Other Ambulatory Visit: Payer: 59

## 2015-12-09 ENCOUNTER — Encounter: Payer: Self-pay | Admitting: Family Medicine

## 2015-12-09 ENCOUNTER — Ambulatory Visit (INDEPENDENT_AMBULATORY_CARE_PROVIDER_SITE_OTHER): Payer: 59 | Admitting: Family Medicine

## 2015-12-09 DIAGNOSIS — J4540 Moderate persistent asthma, uncomplicated: Secondary | ICD-10-CM | POA: Diagnosis not present

## 2015-12-09 DIAGNOSIS — C6211 Malignant neoplasm of descended right testis: Secondary | ICD-10-CM

## 2015-12-09 DIAGNOSIS — L03119 Cellulitis of unspecified part of limb: Secondary | ICD-10-CM

## 2015-12-09 DIAGNOSIS — N529 Male erectile dysfunction, unspecified: Secondary | ICD-10-CM | POA: Insufficient documentation

## 2015-12-09 DIAGNOSIS — K219 Gastro-esophageal reflux disease without esophagitis: Secondary | ICD-10-CM | POA: Insufficient documentation

## 2015-12-09 MED ORDER — CEPHALEXIN 500 MG PO CAPS: 500.0000 mg | ORAL_CAPSULE | Freq: Four times a day (QID) | ORAL | 0 refills | Status: DC

## 2015-12-09 MED ORDER — FLUTICASONE-SALMETEROL 100-50 MCG/DOSE IN AEPB: 1.0000 | INHALATION_SPRAY | Freq: Two times a day (BID) | RESPIRATORY_TRACT | 11 refills | Status: DC

## 2015-12-09 MED ORDER — ALBUTEROL SULFATE HFA 108 (90 BASE) MCG/ACT IN AERS: 2.0000 | INHALATION_SPRAY | Freq: Four times a day (QID) | RESPIRATORY_TRACT | 11 refills | Status: DC | PRN

## 2015-12-09 NOTE — Assessment & Plan Note (Signed)
S: patient has symbicort and advair. Sparingly uses advair, uses symbicort as rescue inhaler. Has seen Dr. Donneta Romberg in the past but not recently. Has chronic dry cough and feels that he cannot take very deep breath. Sometimes gets to wheezing and will feel tight across chest with this.  A/P: educated that albuterol would be proper rescue inhaler. Start advair BID with chronic cough and wheezing episodes. If does not improve within 3-4 weeks, get plugged back in with Dr. Donneta Romberg.

## 2015-12-09 NOTE — Assessment & Plan Note (Signed)
S:Dr. Jeffie Phillips resected the seminoma 2005. He was followed by Dr. Edrick Kins- apparently he was released in the last few years after 10 years of observance. He received no radiation or chemotherapy. A/P: Patient has questions about recurrence of prior cancer and monitoring. No recent labs and cannot view prior Dr. Marin Olp note- patient is going to reach out to Dr. Marin Olp about follow up potential at least to guide me with long term monitoring (or none if not necessary). We also could just place a referral back to oncology if needed at follo wup.

## 2015-12-09 NOTE — Patient Instructions (Addendum)
I want you to take the advair consistently twice a day. Then use albuterol as needed for coughing fits. Give me an update in about 6 weeks- if not significantly improving- get back in with Dr. Donneta Romberg  Refilled keflex- toss the old one  Schedule physical in 3-6 months with labs a week before (ask them to add testosterone as well as psa and a1c). We can recheck blood pressure at that time. You were high today but completely normal previously

## 2015-12-09 NOTE — Assessment & Plan Note (Signed)
S:Recurrent episodes including 2014, 2016 but has had several more. Has keflex on hand as can rapidly progress. Followed by ID in past.  A/P: refilled keflex for him to have on hand and will toss the older bottle.

## 2015-12-09 NOTE — Progress Notes (Signed)
Subjective:  Gregory Zinck. is a 60 y.o. year old very pleasant male patient who presents for/with See problem oriented charting ROS- no nausea or diaphoresis.  No headache or blurry vision. No recent cellulitis.see any ROS included in HPI as well.   Past Medical History-  Patient Active Problem List   Diagnosis Date Noted  . Recurrent cellulitis of lower leg 08/15/2014    Priority: High  . Atopic eczema 03/21/2007    Priority: High  . Lymphedema     Priority: Medium  . Other abnormal glucose 08/13/2013    Priority: Medium  . Asthma 03/28/2009    Priority: Medium  . Seminoma of descended right testis ([place]) 03/21/2007    Priority: Medium  . HYPERLIPIDEMIA 03/21/2007    Priority: Medium  . Erectile dysfunction 12/09/2015    Priority: Low  . GERD (gastroesophageal reflux disease) 12/09/2015    Priority: Low  . Hair loss 08/23/2014    Priority: Low  . Septic thrombophlebitis 08/15/2014    Priority: Low  . COLONIC POLYPS, HX OF 03/28/2009    Priority: Low    Medications- reviewed and updated Current Outpatient Prescriptions  Medication Sig Dispense Refill  . augmented betamethasone dipropionate (DIPROLENE-AF) 0.05 % ointment APPLY TO AFFECTED AREA AT BEDTIME 45 g 0  . betamethasone dipropionate (DIPROLENE) 0.05 % ointment Apply 1 application topically at bedtime. 50 g 5  . cephALEXin (KEFLEX) 500 MG capsule Take 1 capsule (500 mg total) by mouth 4 (four) times daily. 56 capsule 0  . desonide (DESOWEN) 0.05 % ointment APPLY TO AFFECTED AREA AT BEDTIME. 60 g 5  . Fluticasone-Salmeterol (ADVAIR DISKUS) 100-50 MCG/DOSE AEPB Inhale 1 puff into the lungs 2 (two) times daily. 60 each 11  . ranitidine (ZANTAC) 150 MG tablet Take 150 mg by mouth daily as needed.     Marland Kitchen VIAGRA 100 MG tablet TAKE ONE TABLET BY MOUTH AS NEEDED FOR ERECTILE DYSFUNCTION. 12 tablet 1  . albuterol (PROVENTIL HFA;VENTOLIN HFA) 108 (90 Base) MCG/ACT inhaler Inhale 2 puffs into the lungs every 6 (six) hours  as needed for wheezing or shortness of breath. 1 Inhaler 11   No current facility-administered medications for this visit.     Objective: BP (!) 144/96 (BP Location: Left Arm, Patient Position: Sitting, Cuff Size: Normal)   Pulse 80   Temp 98 F (36.7 C) (Oral)   Ht 5' 5.25" (1.657 m)   Wt 165 lb 12.8 oz (75.2 kg)   SpO2 97%   BMI 27.38 kg/m  Gen: NAD, resting comfortably CV: RRR no murmurs rubs or gallops Lungs: CTAB no crackles, wheeze, rhonchi Abdomen: soft/nontender/nondistended/normal bowel sounds.   Ext: no edema Skin: warm, dry Neuro: grossly normal, moves all extremities  Assessment/Plan:  Asthma S: patient has symbicort and advair. Sparingly uses advair, uses symbicort as rescue inhaler. Has seen Dr. Donneta Romberg in the past but not recently. Has chronic dry cough and feels that he cannot take very deep breath. Sometimes gets to wheezing and will feel tight across chest with this.  A/P: educated that albuterol would be proper rescue inhaler. Start advair BID with chronic cough and wheezing episodes. If does not improve within 3-4 weeks, get plugged back in with Dr. Donneta Romberg.    Seminoma of descended right testis [facility]) S:Dr. Jeffie Pollock resected the seminoma 2005. He was followed by Dr. Edrick Kins- apparently he was released in the last few years after 10 years of observance. He received no radiation or chemotherapy. A/P: Patient has questions about recurrence  of prior cancer and monitoring. No recent labs and cannot view prior Dr. Marin Olp note- patient is going to reach out to Dr. Marin Olp about follow up potential at least to guide me with long term monitoring (or none if not necessary). We also could just place a referral back to oncology if needed at follo wup.    Recurrent cellulitis of lower leg S:Recurrent episodes including 2014, 2016 but has had several more. Has keflex on hand as can rapidly progress. Followed by ID in past.  A/P: refilled keflex for him to have on hand  and will toss the older bottle.   Elevated blood pressure- we opted to monitor this as previously controlled and high today- if persistent elevations may need medication but also discussed diet/exercise/home monioring which wife will also do. Follow up  BP Readings from Last 3 Encounters:  12/09/15 (!) 144/96  08/23/14 118/76  08/15/14 127/87   Patient also complains of decreased muscle mass and seems to have more issues with ED- asks abotu his testosterone- encouraged him to schedule cpe in about 3-6 months and check testosterone between 8-10 AM but also check psa and a1c with hyperglycemia history  3-6 month CPE  Meds ordered this encounter  Medications  . DISCONTD: budesonide-formoterol (SYMBICORT) 160-4.5 MCG/ACT inhaler    Sig: Inhale 2 puffs into the lungs 2 (two) times daily.  Marland Kitchen albuterol (PROVENTIL HFA;VENTOLIN HFA) 108 (90 Base) MCG/ACT inhaler    Sig: Inhale 2 puffs into the lungs every 6 (six) hours as needed for wheezing or shortness of breath.    Dispense:  1 Inhaler    Refill:  11  . cephALEXin (KEFLEX) 500 MG capsule    Sig: Take 1 capsule (500 mg total) by mouth 4 (four) times daily.    Dispense:  56 capsule    Refill:  0  . Fluticasone-Salmeterol (ADVAIR DISKUS) 100-50 MCG/DOSE AEPB    Sig: Inhale 1 puff into the lungs 2 (two) times daily.    Dispense:  60 each    Refill:  11   The duration of face-to-face time during this visit was greater than 25 minutes. Greater than 50% of this time was spent in counseling on proper use of medications especially asthma meds, follow up instructions and situations if cellulitis recurs, discussing labs for CPE.   Return precautions advised.  Garret Reddish, MD

## 2015-12-09 NOTE — Progress Notes (Signed)
Pre visit review using our clinic review tool, if applicable. No additional management support is needed unless otherwise documented below in the visit note. 

## 2015-12-16 ENCOUNTER — Other Ambulatory Visit: Payer: Self-pay | Admitting: Family Medicine

## 2016-02-07 ENCOUNTER — Ambulatory Visit (INDEPENDENT_AMBULATORY_CARE_PROVIDER_SITE_OTHER): Payer: 59 | Admitting: Family Medicine

## 2016-02-07 ENCOUNTER — Encounter: Payer: Self-pay | Admitting: Family Medicine

## 2016-02-07 VITALS — BP 128/82 | HR 80 | Temp 97.8°F | Wt 168.8 lb

## 2016-02-07 DIAGNOSIS — J4540 Moderate persistent asthma, uncomplicated: Secondary | ICD-10-CM | POA: Diagnosis not present

## 2016-02-07 DIAGNOSIS — R519 Headache, unspecified: Secondary | ICD-10-CM

## 2016-02-07 DIAGNOSIS — R51 Headache: Secondary | ICD-10-CM | POA: Diagnosis not present

## 2016-02-07 LAB — BASIC METABOLIC PANEL
BUN: 11 mg/dL (ref 6–23)
CO2: 29 mEq/L (ref 19–32)
Calcium: 9.5 mg/dL (ref 8.4–10.5)
Chloride: 100 mEq/L (ref 96–112)
Creatinine, Ser: 0.81 mg/dL (ref 0.40–1.50)
GFR: 103.93 mL/min (ref >60.00)
Glucose, Bld: 94 mg/dL (ref 70–99)
POTASSIUM: 4.3 meq/L (ref 3.5–5.1)
SODIUM: 138 meq/L (ref 135–145)

## 2016-02-07 LAB — SEDIMENTATION RATE: SED RATE: 3 mm/h (ref 0–20)

## 2016-02-07 LAB — CBC
HCT: 45.8 % (ref 39.0–52.0)
Hemoglobin: 15.5 g/dL (ref 13.0–17.0)
MCHC: 33.8 g/dL (ref 30.0–36.0)
MCV: 88.4 fl (ref 78.0–100.0)
Platelets: 232 10*3/uL (ref 150.0–400.0)
RBC: 5.18 Mil/uL (ref 4.22–5.81)
RDW: 14 % (ref 11.5–15.5)
WBC: 7.3 10*3/uL (ref 4.0–10.5)

## 2016-02-07 LAB — C-REACTIVE PROTEIN: CRP: 0.3 mg/dL — ABNORMAL LOW (ref 0.5–20.0)

## 2016-02-07 NOTE — Patient Instructions (Signed)
We will call you within a week about your referral for MRI. If you do not hear within 2 weeks, give [place] a call.  Same thing goes for the Dr. Donneta Romberg referral. Hopeful they can do this within a few weeks for both but late in year may take somewhat longer. I need to know if you have new or worsening symptoms  Labs before you leave  Get your updated eye exam as soon as possible

## 2016-02-07 NOTE — Progress Notes (Signed)
Pre visit review using our clinic review tool, if applicable. No additional management support is needed unless otherwise documented below in the visit note. 

## 2016-02-07 NOTE — Progress Notes (Signed)
Subjective:  Gregory Phillips. is a 60 y.o. year old very pleasant male patient who presents for/with See problem oriented charting ROS- No facial or extremity weakness. No slurred words or trouble swallowing. no blurry vision or double vision. No paresthesias. No confusion or word finding difficulties. Gregory Phillips any ROS included in HPI as well.   Past Medical History-  Patient Active Problem List   Diagnosis Date Noted  . Recurrent cellulitis of lower leg 08/15/2014    Priority: High  . Atopic eczema 03/21/2007    Priority: High  . Lymphedema     Priority: Medium  . Other abnormal glucose 08/13/2013    Priority: Medium  . Asthma 03/28/2009    Priority: Medium  . Seminoma of descended right testis ([place]) 03/21/2007    Priority: Medium  . HYPERLIPIDEMIA 03/21/2007    Priority: Medium  . Erectile dysfunction 12/09/2015    Priority: Low  . GERD (gastroesophageal reflux disease) 12/09/2015    Priority: Low  . Hair loss 08/23/2014    Priority: Low  . Septic thrombophlebitis 08/15/2014    Priority: Low  . COLONIC POLYPS, HX OF 03/28/2009    Priority: Low    Medications- reviewed and updated Current Outpatient Prescriptions  Medication Sig Dispense Refill  . albuterol (PROVENTIL HFA;VENTOLIN HFA) 108 (90 Base) MCG/ACT inhaler Inhale 2 puffs into the lungs every 6 (six) hours as needed for wheezing or shortness of breath. 1 Inhaler 11  . augmented betamethasone dipropionate (DIPROLENE-AF) 0.05 % ointment APPLY TO AFFECTED AREA AT BEDTIME 45 g 0  . betamethasone dipropionate (DIPROLENE) 0.05 % ointment Apply 1 application topically at bedtime. 50 g 5  . desonide (DESOWEN) 0.05 % ointment APPLY TO AFFECTED AREA AT BEDTIME. 60 g 5  . fluocinonide (LIDEX) 0.05 % external solution Apply 1 application topically daily.    . Fluticasone-Salmeterol (ADVAIR DISKUS) 100-50 MCG/DOSE AEPB Inhale 1 puff into the lungs 2 (two) times daily. 60 each 11  . ranitidine (ZANTAC) 150 MG tablet Take 150 mg  by mouth daily as needed.     Gregory Kitchen VIAGRA 100 MG tablet TAKE 1 TABLET BY MOUTH AS NEEDED FOR ERECTILE DYSFUNCTION 12 tablet 1   No current facility-administered medications for this visit.     Objective: BP 128/82 (BP Location: Left Arm, Patient Position: Sitting, Cuff Size: Large)   Pulse 80   Temp 97.8 F (36.6 C) (Oral)   Wt 168 lb 12.8 oz (76.6 kg)   SpO2 97%   BMI 27.87 kg/m  Gen: NAD, resting comfortably CV: RRR no murmurs rubs or gallops Lungs: CTAB no crackles, wheeze, rhonchi Abdomen: soft/nontender/nondistended/normal bowel sounds.   Ext: no edema Skin: warm, dry Neuro: CN II-XII intact, sensation and reflexes normal throughout, 5/5 muscle strength in bilateral upper and lower extremities. Normal finger to nose. Normal rapid alternating movements. No pronator drift. Normal romberg. Normal gait.   Attempted to visualize fundus- but unable with pupillary constriction without dilation  Assessment/Plan:  Headache  S: For 5 days patient has experienced a squeezing sensation in the top of his head rated as 5/10 squeeze sensation. Motrin helped minimally. Has been constant for entire 5 days. No vision or hearing changes. He denies fall or injury. He feels well otherwise. He does feel some tension go down into his neck at times but this Is not as constant as the headache. Has no history of headaches. No fever, chills, nausea, bodyaches A/P: 60 year old male with new onset headache- doubt meningitis- no clear cause  on history or exam. ESR and CRP not elevated. Will get MR to rule out mass or CVA (patient anxious about mass as recently had friend with brain tumor). Also has been sometime since he has seen eye doctor so he will schedule for exam to make sure not related to vision issues. CBC, bmp reassuring.    Asthma S: continued cough, congestion. Occasional weheeze. He tried BID advair instead of daily and seemed to make symptoms worse. Prn albuterol helps some.  A/P: requests  referral back to Dr. Donneta Romberg and this was provided   Orders Placed This Encounter  Procedures  . MR Brain W Wo Contrast    Standing Status:   Future    Standing Expiration Date:   04/08/2017    Order Specific Question:   If indicated for the ordered procedure, I authorize the administration of contrast media per Radiology protocol    Answer:   Yes    Order Specific Question:   Reason for Exam (SYMPTOM  OR DIAGNOSIS REQUIRED)    Answer:   new onset headache over age 7- rule out mass or stroke but reassuring neuro exam    Order Specific Question:   Preferred imaging location?    Answer:   [facility] (table limit-550lbs)    Order Specific Question:   What is the patient's sedation requirement?    Answer:   No Sedation    Order Specific Question:   Does the patient have a pacemaker or implanted devices?    Answer:   No  . CBC    [place]  . Basic metabolic panel    [place]  . Sedimentation rate      . C-reactive Protein  . Ambulatory referral to Allergy    Referral Priority:   Routine    Referral Type:   Allergy Testing    Referral Reason:   Specialty Services Required    Requested Specialty:   Allergy    Number of Visits Requested:   1    Return precautions advised.  Gregory Reddish, MD

## 2016-02-08 NOTE — Assessment & Plan Note (Signed)
S: continued cough, congestion. Occasional weheeze. He tried BID advair instead of daily and seemed to make symptoms worse. Prn albuterol helps some.  A/P: requests referral back to Dr. Donneta Romberg and this was provided

## 2016-02-23 ENCOUNTER — Ambulatory Visit
Admission: RE | Admit: 2016-02-23 | Discharge: 2016-02-23 | Disposition: A | Discharge status: Home / Self Care | Payer: 59 | Source: Ambulatory Visit | Attending: Family Medicine | Admitting: Family Medicine

## 2016-02-23 DIAGNOSIS — R51 Headache: Principal | ICD-10-CM

## 2016-02-23 DIAGNOSIS — R519 Headache, unspecified: Secondary | ICD-10-CM

## 2016-02-23 MED ORDER — GADOBENATE DIMEGLUMINE 529 MG/ML IV SOLN: 15.0000 mL | Freq: Once | INTRAVENOUS | Status: DC | PRN

## 2016-03-03 ENCOUNTER — Other Ambulatory Visit (INDEPENDENT_AMBULATORY_CARE_PROVIDER_SITE_OTHER): Payer: 59

## 2016-03-03 ENCOUNTER — Other Ambulatory Visit: Payer: Self-pay | Admitting: Allergy

## 2016-03-03 ENCOUNTER — Ambulatory Visit
Admission: RE | Admit: 2016-03-03 | Discharge: 2016-03-03 | Disposition: A | Discharge status: Home / Self Care | Payer: 59 | Source: Ambulatory Visit | Attending: Allergy | Admitting: Allergy

## 2016-03-03 DIAGNOSIS — J4530 Mild persistent asthma, uncomplicated: Secondary | ICD-10-CM

## 2016-03-03 DIAGNOSIS — Z0000 Encounter for general adult medical examination without abnormal findings: Secondary | ICD-10-CM | POA: Diagnosis not present

## 2016-03-03 LAB — POC URINALSYSI DIPSTICK (AUTOMATED)
BILIRUBIN UA: NEGATIVE
Glucose, UA: NEGATIVE
KETONES UA: NEGATIVE
LEUKOCYTES UA: NEGATIVE
Nitrite, UA: NEGATIVE
PH UA: 6
RBC UA: NEGATIVE
SPEC GRAV UA: 1.02
Urobilinogen, UA: 0.2

## 2016-03-03 LAB — BASIC METABOLIC PANEL
BUN: 15 mg/dL (ref 6–23)
CHLORIDE: 101 meq/L (ref 96–112)
CO2: 30 mEq/L (ref 19–32)
Calcium: 9.5 mg/dL (ref 8.4–10.5)
Creatinine, Ser: 0.86 mg/dL (ref 0.40–1.50)
GFR: 96.96 mL/min (ref >60.00)
Glucose, Bld: 96 mg/dL (ref 70–99)
POTASSIUM: 5.1 meq/L (ref 3.5–5.1)
Sodium: 140 mEq/L (ref 135–145)

## 2016-03-03 LAB — HEPATIC FUNCTION PANEL
ALT: 23 U/L (ref 0–53)
AST: 22 U/L (ref 0–37)
Albumin: 4.2 g/dL (ref 3.5–5.2)
Alkaline Phosphatase: 54 U/L (ref 39–117)
BILIRUBIN DIRECT: 0.2 mg/dL (ref 0.0–0.3)
TOTAL PROTEIN: 7.3 g/dL (ref 6.0–8.3)
Total Bilirubin: 1.3 mg/dL — ABNORMAL HIGH (ref 0.2–1.2)

## 2016-03-03 LAB — CBC WITH DIFFERENTIAL/PLATELET
BASOS PCT: 0.5 % (ref 0.0–3.0)
Basophils Absolute: 0 10*3/uL (ref 0.0–0.1)
EOS ABS: 1.1 10*3/uL — AB (ref 0.0–0.7)
EOS PCT: 13.4 % — AB (ref 0.0–5.0)
HEMATOCRIT: 45.9 % (ref 39.0–52.0)
HEMOGLOBIN: 15.4 g/dL (ref 13.0–17.0)
LYMPHS PCT: 13.3 % (ref 12.0–46.0)
Lymphs Abs: 1.1 10*3/uL (ref 0.7–4.0)
MCHC: 33.7 g/dL (ref 30.0–36.0)
MCV: 89.7 fl (ref 78.0–100.0)
Monocytes Absolute: 1 10*3/uL (ref 0.1–1.0)
Monocytes Relative: 12.2 % — ABNORMAL HIGH (ref 3.0–12.0)
Neutro Abs: 4.9 10*3/uL (ref 1.4–7.7)
Neutrophils Relative %: 60.6 % (ref 43.0–77.0)
Platelets: 232 10*3/uL (ref 150.0–400.0)
RBC: 5.11 Mil/uL (ref 4.22–5.81)
RDW: 13.4 % (ref 11.5–15.5)
WBC: 8 10*3/uL (ref 4.0–10.5)

## 2016-03-03 LAB — LIPID PANEL
CHOL/HDL RATIO: 4
Cholesterol: 224 mg/dL — ABNORMAL HIGH (ref 0–200)
HDL: 63.1 mg/dL (ref >39.00)
LDL CALC: 148 mg/dL — AB (ref 0–99)
NonHDL: 161.18
TRIGLYCERIDES: 64 mg/dL (ref 0.0–149.0)
VLDL: 12.8 mg/dL (ref 0.0–40.0)

## 2016-03-03 LAB — TSH: TSH: 1.49 u[IU]/mL (ref 0.35–4.50)

## 2016-03-03 LAB — PSA: PSA: 0.68 ng/mL (ref 0.10–4.00)

## 2016-03-10 ENCOUNTER — Encounter: Payer: Self-pay | Admitting: Family Medicine

## 2016-03-10 ENCOUNTER — Ambulatory Visit (INDEPENDENT_AMBULATORY_CARE_PROVIDER_SITE_OTHER): Payer: 59 | Admitting: Family Medicine

## 2016-03-10 VITALS — BP 126/72 | HR 69 | Temp 97.5°F | Ht 65.0 in | Wt 167.8 lb

## 2016-03-10 DIAGNOSIS — Z0000 Encounter for general adult medical examination without abnormal findings: Secondary | ICD-10-CM | POA: Diagnosis not present

## 2016-03-10 NOTE — Progress Notes (Signed)
Pre visit review using our clinic review tool, if applicable. No additional management support is needed unless otherwise documented below in the visit note. 

## 2016-03-10 NOTE — Patient Instructions (Addendum)
start aspirin 81mg  due to plaque on MRI and cholesterol being high to lower risk some in brain and heart. Consider statin if 10 year risk of heart attack or stroke gets above 7.5%  Sugar levels look ok but we want to work on healthy eating and regular exercise for cholesterol and sugar levels

## 2016-03-10 NOTE — Progress Notes (Signed)
Phone: [phone]  Subjective:  Patient presents today for their annual physical. Chief complaint-noted.   See problem oriented charting- ROS- full  review of systems was completed and negative except for: continued eczema, allergy, asthma issues- working with asthma/allergy and dermatology  The following were reviewed and entered/updated in epic: Past Medical History:  Diagnosis Date  . Asthma   . Cellulitis 2001    LLE ; [facility]  . Cellulitis 2007   R elbow ; S/ P drainage by Dr Telford Nab  . Cellulitis 2014   with sepsis  . Cellulitis 08/09/2014   LLE  . Eczema   . Edema   . Hyperlipidemia   . Recurrent cellulitis of lower leg 08/15/2014  . Septic thrombophlebitis 08/15/2014  . Testicular cancer [facility]) 2004   Dr Jeffie Pollock   Patient Active Problem List   Diagnosis Date Noted  . Recurrent cellulitis of lower leg 08/15/2014    Priority: High  . Atopic eczema 03/21/2007    Priority: High  . Lymphedema     Priority: Medium  . Other abnormal glucose 08/13/2013    Priority: Medium  . Asthma 03/28/2009    Priority: Medium  . Seminoma of descended right testis ([place]) 03/21/2007    Priority: Medium  . HYPERLIPIDEMIA 03/21/2007    Priority: Medium  . Erectile dysfunction 12/09/2015    Priority: Low  . GERD (gastroesophageal reflux disease) 12/09/2015    Priority: Low  . Hair loss 08/23/2014    Priority: Low  . Septic thrombophlebitis 08/15/2014    Priority: Low  . COLONIC POLYPS, HX OF 03/28/2009    Priority: Low   Past Surgical History:  Procedure Laterality Date  . COLONOSCOPY W/ POLYPECTOMY  05/14/2008 & 04/2012   Tubular adenoma; Dr. Verl Blalock  . FOOT FRACTURE SURGERY Left 05/2012  . FRACTURE SURGERY    . I&D EXTREMITY Left 2004   elbow  . KNEE ARTHROPLASTY Right     Dr Noemi Chapel,  for patellar instability  . KNEE ARTHROSCOPY Right 1981    Dr Noemi Chapel  . REFRACTIVE SURGERY Bilateral 2004  . TUMOR EXCISION  2005    Seminoma; Dr Jeffie Pollock    Family  History  Problem Relation Age of Onset  . Heart disease Father     dysrrhymthmia  . Colon cancer Father   . Liver cancer Father   . Transient ischemic attack Mother     in 47s  . Asthma Sister   . Cancer Paternal Grandfather     stomach  . Eczema Maternal Uncle   . Diabetes Neg Hx     Medications- reviewed and updated Current Outpatient Prescriptions  Medication Sig Dispense Refill  . Albuterol Sulfate (PROAIR HFA IN) Inhale into the lungs.    Marland Kitchen augmented betamethasone dipropionate (DIPROLENE-AF) 0.05 % ointment APPLY TO AFFECTED AREA AT BEDTIME 45 g 0  . azelastine (ASTELIN) 0.1 % nasal spray Place 1 spray into both nostrils 2 (two) times daily. Use in each nostril as directed    . desonide (DESOWEN) 0.05 % ointment APPLY TO AFFECTED AREA AT BEDTIME. 60 g 5  . EPINEPHrine 0.3 mg/0.3 mL IJ SOAJ injection Inject 0.3 mg into the muscle once.    . fluocinonide (LIDEX) 0.05 % external solution Apply 1 application topically daily.    . fluticasone (FLONASE) 50 MCG/ACT nasal spray Place 1 spray into both nostrils daily.    . fluticasone furoate-vilanterol (BREO ELLIPTA) 200-25 MCG/INH AEPB Inhale 1 puff into the lungs daily.    Marland Kitchen  levocetirizine (XYZAL) 5 MG tablet Take 5 mg by mouth every evening.    . ranitidine (ZANTAC) 150 MG tablet Take 150 mg by mouth daily as needed.     Marland Kitchen VIAGRA 100 MG tablet TAKE 1 TABLET BY MOUTH AS NEEDED FOR ERECTILE DYSFUNCTION 12 tablet 1   No current facility-administered medications for this visit.     Allergies-reviewed and updated No Known Allergies  Social History   Social History  . Marital status: Married    Spouse name: N/A  . Number of children: N/A  . Years of education: N/A   Social History Main Topics  . Smoking status: Never Smoker  . Smokeless tobacco: Never Used  . Alcohol use 7.2 oz/week    12 Cans of beer per week  . Drug use: No  . Sexual activity: Yes   Other Topics Concern  . None   Social History Narrative  . None     Objective: BP 126/72 (BP Location: Left Arm, Patient Position: Sitting, Cuff Size: Large)   Pulse 69   Temp 97.5 F (36.4 C) (Oral)   Ht 5\' 5"  (1.651 m)   Wt 167 lb 12.8 oz (76.1 kg)   SpO2 95%   BMI 27.92 kg/m  Gen: NAD, resting comfortably HEENT: Mucous membranes are moist. Oropharynx normal Neck: no thyromegaly CV: RRR no murmurs rubs or gallops Lungs: CTAB no crackles, wheeze, rhonchi Abdomen: soft/nontender/nondistended/normal bowel sounds. No rebound or guarding. Slightly overweight Ext: no edema Skin: warm, dry, extensive eczema on abdomen primarily but also on back Neuro: grossly normal, moves all extremities, PERRLA Rectal: normal tone, normal sized prostate, no masses or tenderness  Assessment/Plan:  60 y.o. male presenting for annual physical.  Health Maintenance counseling: 1. Anticipatory guidance: Patient counseled regarding regular dental exams, eye exams, wearing seatbelts.  2. Risk factor reduction:  Advised patient of need for regular exercise and diet rich and fruits and vegetables to reduce risk of heart attack and stroke. .  Had been walking 2-3x a week for 30-60 minutes- needs to restart as only doing once a week. Recommend 150 minutes a week exercise. Weight reasonable and stable. Discussed dietary changes as well- cut down on fried foods, reducing animal protein intake.  3. Immunizations/screenings/ancillary studies- declines all immunizations. Discussed need for tdap for cuts/scrapes.  Health Maintenance Due  Topic Date Due  . COLONOSCOPY - printed letter for him to call Dr. Hilarie Fredrickson to set up 04/16/2015   4. Prostate cancer screening- low risk based on  psa trend and rectal exam  Lab Results  Component Value Date   PSA 0.68 03/03/2016   PSA 0.53 09/03/2014   PSA 0.50 07/22/2012   5. Colon cancer screening - tubular adenoma 2010 then repeat 2014- due 2017 (discussed recommendations form Dr. Hilarie Fredrickson today). See above 6. Skin cancer screening- [place] derm  at least yearly  Status of chronic or acute concerns  Recurrent cellulitis- no recent flare. Keeps keflex on hand due to potential for rapid progression. Lymphedema as a result of infections on left leg  Hyperglycemia- .CBG was 96 indicating no increased risk at present though could have slightly high a1c Lab Results  Component Value Date   HGBA1C 5.7 (H) 08/12/2014   Eczema- follows with [place] derm Dr. Ronnald Ramp. Considering dupixent for eczema and asthma- weekly shots.   Asthma- breo scheduled and albuterol prn. Following with Dr. Donneta Romberg- switched to   Allergies- reasonable control on xyzal and flonase. Also has epi pen with Dr. Donneta Romberg. Also on  dymista   ED_ on viagra prn  HLD- not on statin. 6.6 % 10 year risk in 2017 will work on weight loss.   GERD-zantac daily  1 year CPE  Updated meds Meds ordered this encounter  Medications  . levocetirizine (XYZAL) 5 MG tablet    Sig: Take 5 mg by mouth every evening.  . fluticasone furoate-vilanterol (BREO ELLIPTA) 200-25 MCG/INH AEPB    Sig: Inhale 1 puff into the lungs daily.  Marland Kitchen azelastine (ASTELIN) 0.1 % nasal spray    Sig: Place 1 spray into both nostrils 2 (two) times daily. Use in each nostril as directed  . fluticasone (FLONASE) 50 MCG/ACT nasal spray    Sig: Place 1 spray into both nostrils daily.  . Albuterol Sulfate (PROAIR HFA IN)    Sig: Inhale into the lungs.  Marland Kitchen EPINEPHrine 0.3 mg/0.3 mL IJ SOAJ injection    Sig: Inject 0.3 mg into the muscle once.    Return precautions advised.   Garret Reddish, MD

## 2016-04-28 DIAGNOSIS — L209 Atopic dermatitis, unspecified: Secondary | ICD-10-CM | POA: Diagnosis not present

## 2016-05-01 ENCOUNTER — Telehealth: Payer: Self-pay

## 2016-05-01 NOTE — Telephone Encounter (Signed)
Received PA request from [facility] for Sildenafil 100 mg tablet. PA submitted & is pending. Key: [id number]

## 2016-06-03 DIAGNOSIS — J3089 Other allergic rhinitis: Secondary | ICD-10-CM | POA: Diagnosis not present

## 2016-06-03 DIAGNOSIS — J4530 Mild persistent asthma, uncomplicated: Secondary | ICD-10-CM | POA: Diagnosis not present

## 2016-06-03 DIAGNOSIS — J301 Allergic rhinitis due to pollen: Secondary | ICD-10-CM | POA: Diagnosis not present

## 2016-07-28 DIAGNOSIS — L2089 Other atopic dermatitis: Secondary | ICD-10-CM | POA: Diagnosis not present

## 2016-07-28 DIAGNOSIS — L738 Other specified follicular disorders: Secondary | ICD-10-CM | POA: Diagnosis not present

## 2016-12-08 DIAGNOSIS — J301 Allergic rhinitis due to pollen: Secondary | ICD-10-CM | POA: Diagnosis not present

## 2016-12-08 DIAGNOSIS — J4530 Mild persistent asthma, uncomplicated: Secondary | ICD-10-CM | POA: Diagnosis not present

## 2016-12-08 DIAGNOSIS — J3089 Other allergic rhinitis: Secondary | ICD-10-CM | POA: Diagnosis not present

## 2017-02-23 ENCOUNTER — Encounter: Payer: Self-pay | Admitting: Internal Medicine

## 2017-03-07 DIAGNOSIS — S92514A Nondisplaced fracture of proximal phalanx of right lesser toe(s), initial encounter for closed fracture: Secondary | ICD-10-CM | POA: Diagnosis not present

## 2017-03-15 DIAGNOSIS — S92514D Nondisplaced fracture of proximal phalanx of right lesser toe(s), subsequent encounter for fracture with routine healing: Secondary | ICD-10-CM | POA: Diagnosis not present

## 2017-03-25 DIAGNOSIS — S92514D Nondisplaced fracture of proximal phalanx of right lesser toe(s), subsequent encounter for fracture with routine healing: Secondary | ICD-10-CM | POA: Diagnosis not present

## 2017-04-05 ENCOUNTER — Other Ambulatory Visit: Payer: Self-pay

## 2017-04-05 ENCOUNTER — Ambulatory Visit (AMBULATORY_SURGERY_CENTER): Payer: Self-pay | Admitting: *Deleted

## 2017-04-05 VITALS — Ht 65.0 in | Wt 176.6 lb

## 2017-04-05 DIAGNOSIS — Z86010 Personal history of colonic polyps: Secondary | ICD-10-CM

## 2017-04-05 DIAGNOSIS — Z800 Family history of malignant neoplasm of digestive organs: Secondary | ICD-10-CM

## 2017-04-05 MED ORDER — NA SULFATE-K SULFATE-MG SULF 17.5-3.13-1.6 GM/177ML PO SOLN: 1.0000 [IU] | Freq: Once | ORAL | 0 refills | Status: AC

## 2017-04-05 NOTE — Progress Notes (Signed)
No egg or soy allergy known to patient  No issues with past sedation with any surgeries  or procedures, no intubation problems  No diet pills per patient No home 02 use per patient  No blood thinners per patient  Pt denies issues with constipation  No A fib or A flutter  EMMI video sent to pt's e mail  

## 2017-04-12 DIAGNOSIS — S92514D Nondisplaced fracture of proximal phalanx of right lesser toe(s), subsequent encounter for fracture with routine healing: Secondary | ICD-10-CM | POA: Diagnosis not present

## 2017-04-19 ENCOUNTER — Encounter: Payer: 59 | Admitting: Internal Medicine

## 2017-04-23 ENCOUNTER — Encounter: Payer: Self-pay | Admitting: Internal Medicine

## 2017-05-06 DIAGNOSIS — S92514D Nondisplaced fracture of proximal phalanx of right lesser toe(s), subsequent encounter for fracture with routine healing: Secondary | ICD-10-CM | POA: Diagnosis not present

## 2017-05-07 ENCOUNTER — Other Ambulatory Visit: Payer: Self-pay

## 2017-05-07 ENCOUNTER — Ambulatory Visit (AMBULATORY_SURGERY_CENTER): Payer: 59 | Admitting: Internal Medicine

## 2017-05-07 ENCOUNTER — Encounter: Payer: Self-pay | Admitting: Internal Medicine

## 2017-05-07 VITALS — BP 115/77 | HR 74 | Temp 98.6°F | Resp 17 | Ht 65.0 in | Wt 176.0 lb

## 2017-05-07 DIAGNOSIS — K621 Rectal polyp: Secondary | ICD-10-CM

## 2017-05-07 DIAGNOSIS — D126 Benign neoplasm of colon, unspecified: Secondary | ICD-10-CM

## 2017-05-07 DIAGNOSIS — D129 Benign neoplasm of anus and anal canal: Secondary | ICD-10-CM

## 2017-05-07 DIAGNOSIS — Z86010 Personal history of colonic polyps: Secondary | ICD-10-CM

## 2017-05-07 DIAGNOSIS — D123 Benign neoplasm of transverse colon: Secondary | ICD-10-CM

## 2017-05-07 DIAGNOSIS — K635 Polyp of colon: Secondary | ICD-10-CM

## 2017-05-07 DIAGNOSIS — D122 Benign neoplasm of ascending colon: Secondary | ICD-10-CM

## 2017-05-07 DIAGNOSIS — D128 Benign neoplasm of rectum: Secondary | ICD-10-CM

## 2017-05-07 DIAGNOSIS — Z1211 Encounter for screening for malignant neoplasm of colon: Secondary | ICD-10-CM | POA: Diagnosis not present

## 2017-05-07 MED ORDER — SODIUM CHLORIDE 0.9 % IV SOLN: 500.0000 mL | Freq: Once | INTRAVENOUS | Status: DC

## 2017-05-07 NOTE — Progress Notes (Signed)
Pt's states no medical or surgical changes since previsit or office visit. 

## 2017-05-07 NOTE — Progress Notes (Signed)
Called to room to assist during endoscopic procedure.  Patient ID and intended procedure confirmed with present staff. Received instructions for my participation in the procedure from the performing physician.  

## 2017-05-07 NOTE — Patient Instructions (Signed)
HANDOUTS GIVEN FOR HEMORRHOIDS, POLYPS AND DIVERTICULOSIS  YOU HAD AN ENDOSCOPIC PROCEDURE TODAY AT [facility]:   Refer to the procedure report that was given to you for any specific questions about what was found during the examination.  If the procedure report does not answer your questions, please call your gastroenterologist to clarify.  If you requested that your care partner not be given the details of your procedure findings, then the procedure report has been included in a sealed envelope for you to review at your convenience later.  YOU SHOULD EXPECT: Some feelings of bloating in the abdomen. Passage of more gas than usual.  Walking can help get rid of the air that was put into your GI tract during the procedure and reduce the bloating. If you had a lower endoscopy (such as a colonoscopy or flexible sigmoidoscopy) you may notice spotting of blood in your stool or on the toilet paper. If you underwent a bowel prep for your procedure, you may not have a normal bowel movement for a few days.  Please Note:  You might notice some irritation and congestion in your nose or some drainage.  This is from the oxygen used during your procedure.  There is no need for concern and it should clear up in a day or so.  SYMPTOMS TO REPORT IMMEDIATELY:   Following lower endoscopy (colonoscopy or flexible sigmoidoscopy):  Excessive amounts of blood in the stool  Significant tenderness or worsening of abdominal pains  Swelling of the abdomen that is new, acute  Fever of 100F or higher For urgent or emergent issues, a gastroenterologist can be reached at any hour by calling [phone].   DIET:  We do recommend a small meal at first, but then you may proceed to your regular diet.  Drink plenty of fluids but you should avoid alcoholic beverages for 24 hours.  ACTIVITY:  You should plan to take it easy for the rest of today and you should NOT DRIVE or use heavy machinery until tomorrow  (because of the sedation medicines used during the test).    FOLLOW UP: Our staff will call the number listed on your records the next business day following your procedure to check on you and address any questions or concerns that you may have regarding the information given to you following your procedure. If we do not reach you, we will leave a message.  However, if you are feeling well and you are not experiencing any problems, there is no need to return our call.  We will assume that you have returned to your regular daily activities without incident.  If any biopsies were taken you will be contacted by phone or by letter within the next 1-3 weeks.  Please call us at [phone] if you have not heard about the biopsies in 3 weeks.    SIGNATURES/CONFIDENTIALITY: You and/or your care partner have signed paperwork which will be entered into your electronic medical record.  These signatures attest to the fact that that the information above on your After Visit Summary has been reviewed and is understood.  Full responsibility of the confidentiality of this discharge information lies with you and/or your care-partner.

## 2017-05-07 NOTE — Op Note (Addendum)
[facility] Patient Name: Gregory Phillips Procedure Date: 05/07/2017 9:57 AM MRN: [id number] Endoscopist: Jerene Bears , MD Age: 62 Referring MD:  Date of Birth: Mar 21, 1956 Gender: Male Account #: [id number] Procedure:                Colonoscopy Indications:              Surveillance: Personal history of adenomatous                            polyps on last colonoscopy 5 years ago, Family                            history of colon cancer in a first-degree relative Medicines:                Monitored Anesthesia Care Procedure:                Pre-Anesthesia Assessment:                           - Prior to the procedure, a History and Physical                            was performed, and patient medications and                            allergies were reviewed. The patient's tolerance of                            previous anesthesia was also reviewed. The risks                            and benefits of the procedure and the sedation                            options and risks were discussed with the patient.                            All questions were answered, and informed consent                            was obtained. Prior Anticoagulants: The patient has                            taken no previous anticoagulant or antiplatelet                            agents. ASA Grade Assessment: II - A patient with                            mild systemic disease. After reviewing the risks                            and benefits, the patient was deemed in  satisfactory condition to undergo the procedure.                           After obtaining informed consent, the colonoscope                            was passed under direct vision. Throughout the                            procedure, the patient's blood pressure, pulse, and                            oxygen saturations were monitored continuously. The                            Model CF-HQ190L  [id number]) scope was introduced                            through the anus and advanced to the the cecum,                            identified by appendiceal orifice and ileocecal                            valve. The colonoscopy was performed without                            difficulty. The patient tolerated the procedure                            well. The quality of the bowel preparation was                            good. The ileocecal valve, appendiceal orifice, and                            rectum were photographed. Scope In: 10:03:13 AM Scope Out: 10:13:51 AM Scope Withdrawal Time: 0 hours 9 minutes 34 seconds  Total Procedure Duration: 0 hours 10 minutes 38 seconds  Findings:                 The perianal exam findings include skin tags.                           A 2 mm polyp was found in the ascending colon. The                            polyp was sessile. The polyp was removed with a                            cold biopsy forceps. Resection and retrieval were                            complete.  A 3 mm polyp was found in the transverse colon. The                            polyp was sessile. The polyp was removed with a                            cold biopsy forceps. Resection and retrieval were                            complete.                           A 5 mm polyp was found in the rectum. The polyp was                            sessile. The polyp was removed with a cold snare.                            Resection and retrieval were complete.                           A few small-mouthed diverticula were found in the                            sigmoid colon.                           Internal hemorrhoids were found during                            retroflexion. The hemorrhoids were small and                            medium-sized. Complications:            No immediate complications. Estimated Blood Loss:     Estimated blood loss was  minimal. Impression:               - One 2 mm polyp in the ascending colon, removed                            with a cold biopsy forceps. Resected and retrieved.                           - One 3 mm polyp in the transverse colon, removed                            with a cold biopsy forceps. Resected and retrieved.                           - One 5 mm polyp in the rectum, removed with a cold                            snare. Resected and retrieved.                           -  Mild diverticulosis in the sigmoid colon.                           - Internal hemorrhoids and perianal skin tags. Recommendation:           - Patient has a contact number available for                            emergencies. The signs and symptoms of potential                            delayed complications were discussed with the                            patient. Return to normal activities tomorrow.                            Written discharge instructions were provided to the                            patient.                           - Resume previous diet.                           - Continue present medications.                           - Await pathology results.                           - Repeat colonoscopy is recommended for                            surveillance. The colonoscopy date will be                            determined after pathology results from today's                            exam become available for review. Jerene Bears, MD 05/07/2017 10:25:49 AM This report has been signed electronically.

## 2017-05-10 ENCOUNTER — Telehealth: Payer: Self-pay | Admitting: *Deleted

## 2017-05-10 NOTE — Telephone Encounter (Signed)
No answer for post procedure call back will attempt to call back later this afternoon. SM 

## 2017-05-10 NOTE — Telephone Encounter (Signed)
Left message after second post procedure call back for patient to call with questions or concerns. SM

## 2017-05-12 ENCOUNTER — Encounter: Payer: Self-pay | Admitting: Internal Medicine

## 2017-05-13 ENCOUNTER — Encounter: Payer: Self-pay | Admitting: Family Medicine

## 2017-05-18 ENCOUNTER — Ambulatory Visit: Payer: 59 | Admitting: Podiatry

## 2017-05-18 ENCOUNTER — Ambulatory Visit: Payer: 59

## 2017-05-18 ENCOUNTER — Encounter: Payer: Self-pay | Admitting: Podiatry

## 2017-05-18 VITALS — BP 150/99 | HR 87 | Resp 16

## 2017-05-18 DIAGNOSIS — S92501A Displaced unspecified fracture of right lesser toe(s), initial encounter for closed fracture: Secondary | ICD-10-CM | POA: Diagnosis not present

## 2017-05-18 NOTE — Progress Notes (Signed)
Subjective:  Patient ID: Gregory Phillips., male    DOB: 03-12-1956,  MRN: [id number] HPI Chief Complaint  Patient presents with  . Toe Injury    5th toe right - patient states he broke toe before Christmas, Dr. Noemi Chapel was treating and referred here for re-evaluation, said should have been healed already, questions regarding orthotics-brought xrays from Dr. Archie Endo office  . NOTE    Patient has recurring cellulitis left lower leg-uses compression stockings, any other suggestions?    62 y.o. male presents with the above complaint.     Past Medical History:  Diagnosis Date  . Allergy   . Asthma    in past  . Cellulitis 2001    LLE ; [facility]  . Cellulitis 2007   R elbow ; S/ P drainage by Dr Telford Nab  . Cellulitis 2014   with sepsis  . Cellulitis 08/09/2014   LLE  . Eczema   . Edema   . Recurrent cellulitis of lower leg 08/15/2014  . Septic thrombophlebitis 08/15/2014  . Testicular cancer [facility]) 2004   Dr Jeffie Pollock   Past Surgical History:  Procedure Laterality Date  . COLONOSCOPY W/ POLYPECTOMY  05/14/2008 & 04/2012   Tubular adenoma; Dr. Verl Blalock  . FOOT FRACTURE SURGERY Left 05/2012  . FRACTURE SURGERY    . I&D EXTREMITY Left 2004   elbow  . KNEE ARTHROPLASTY Right     Dr Noemi Chapel,  for patellar instability  . KNEE ARTHROSCOPY Right 1981    Dr Noemi Chapel  . REFRACTIVE SURGERY Bilateral 2004  . testicle removed Right    pt. not 100 percent sure  . TUMOR EXCISION  2005    Seminoma; Dr Jeffie Pollock    Current Outpatient Medications:  .  Albuterol Sulfate (PROAIR HFA IN), Inhale into the lungs., Disp: , Rfl:  .  augmented betamethasone dipropionate (DIPROLENE-AF) 0.05 % ointment, APPLY TO AFFECTED AREA AT BEDTIME, Disp: 45 g, Rfl: 0 .  betamethasone dipropionate (DIPROLENE) 0.05 % ointment, APPLY TO THE AFECTED AREA D, Disp: , Rfl: 0 .  desonide (DESOWEN) 0.05 % ointment, APPLY TO AFFECTED AREA AT BEDTIME., Disp: 60 g, Rfl: 5 .  DUPIXENT 300 MG/2ML SOSY, , Disp:  , Rfl:  .  EPINEPHrine 0.3 mg/0.3 mL IJ SOAJ injection, Inject 0.3 mg into the muscle once., Disp: , Rfl:  .  fluticasone (FLONASE) 50 MCG/ACT nasal spray, Place 1 spray into both nostrils daily., Disp: , Rfl:  .  ranitidine (ZANTAC) 150 MG tablet, Take 150 mg by mouth daily as needed. , Disp: , Rfl:   Current Facility-Administered Medications:  .  0.9 %  sodium chloride infusion, 500 mL, Intravenous, Once, Pyrtle, Lajuan Lines, MD  No Known Allergies Review of Systems  Respiratory: Positive for apnea.   Cardiovascular: Positive for leg swelling.  Skin: Positive for rash.  All other systems reviewed and are negative.  Objective:   Vitals:   05/18/17 1608  BP: (!) 150/99  Pulse: 87  Resp: 16    General: Well developed, nourished, in no acute distress, alert and oriented x3   Dermatological: Skin is warm, dry and supple bilateral. Nails x 10 are well maintained; remaining integument appears unremarkable at this time. There are no open sores, no preulcerative lesions, no rash or signs of infection present.  Lymphedema left lower extremity.  Vascular: Dorsalis Pedis artery and Posterior Tibial artery pedal pulses are 2/4 bilateral with immedate capillary fill time. Pedal hair growth present. No varicosities and no lower  extremity edema present bilateral.   Neruologic: Grossly intact via light touch bilateral. Vibratory intact via tuning fork bilateral. Protective threshold with Semmes Wienstein monofilament intact to all pedal sites bilateral. Patellar and Achilles deep tendon reflexes 2+ bilateral. No Babinski or clonus noted bilateral.   Musculoskeletal: No gross boney pedal deformities bilateral. No pain, crepitus, or limitation noted with foot and ankle range of motion bilateral. Muscular strength 5/5 in all groups tested bilateral.  Mild swelling to the fifth digit of the right foot.  Mild tenderness on palpation of the fifth metatarsal phalangeal joint.  Gait: Unassisted, Nonantalgic.     Radiographs:  Evaluated radiographs that he brought with him today recently taken demonstrating a fracture at the base of the proximal phalanx fifth digit right foot appears to be comminuted nondisplaced.  Assessment & Plan:   Assessment: Lymphedema left fractured fifth toe right  Plan: Discussed etiology pathology conservative versus surgical therapies.  Explained to him that it would take time for him to heal the fracture he understands that is amenable to it discussed appropriate shoe gear with this.  For the left foot we are going to see by getting him into a lymphedema clinic.     [name], [place]

## 2017-05-20 ENCOUNTER — Telehealth: Payer: Self-pay | Admitting: *Deleted

## 2017-05-20 DIAGNOSIS — R609 Edema, unspecified: Secondary | ICD-10-CM

## 2017-05-20 NOTE — Telephone Encounter (Signed)
[facility] referral made and will be worked from the [place]. I informed [facility] and she states she sees them in the [place].

## 2017-06-14 DIAGNOSIS — S92514D Nondisplaced fracture of proximal phalanx of right lesser toe(s), subsequent encounter for fracture with routine healing: Secondary | ICD-10-CM | POA: Diagnosis not present

## 2017-06-17 ENCOUNTER — Ambulatory Visit: Payer: 59

## 2017-06-17 ENCOUNTER — Encounter: Payer: 59 | Admitting: Podiatry

## 2017-06-17 ENCOUNTER — Encounter: Payer: Self-pay | Admitting: Podiatry

## 2017-06-17 ENCOUNTER — Ambulatory Visit: Payer: 59 | Admitting: Podiatry

## 2017-06-17 DIAGNOSIS — M79671 Pain in right foot: Secondary | ICD-10-CM | POA: Diagnosis not present

## 2017-06-17 DIAGNOSIS — S92501D Displaced unspecified fracture of right lesser toe(s), subsequent encounter for fracture with routine healing: Secondary | ICD-10-CM | POA: Diagnosis not present

## 2017-06-19 NOTE — Progress Notes (Signed)
He presents today for follow-up of a fracture fifth digit right foot.  There is still sore and swollen.  States that he just had another set of x-rays this past Monday which he brought the disc with him today.  States that he is scheduled for an MRI this afternoon.  MRI is to rule out a neuroma or stress fracture.  Objective: Vital signs are stable he is alert and oriented x3.  Pulses remain palpable no change in physical exam still tender along the third fourth fifth metatarsal phalangeal joint areas particularly the fifth digit of the right foot.  Assessment: Delayed healing of the fifth digit fracture cannot rule out stress fracture metatarsalgia.  Plan: Recommended highly that he get an MRI performed.

## 2017-06-24 ENCOUNTER — Telehealth: Payer: Self-pay | Admitting: Podiatry

## 2017-06-24 DIAGNOSIS — M8589 Other specified disorders of bone density and structure, multiple sites: Secondary | ICD-10-CM | POA: Diagnosis not present

## 2017-06-24 DIAGNOSIS — Z13820 Encounter for screening for osteoporosis: Secondary | ICD-10-CM | POA: Diagnosis not present

## 2017-06-24 LAB — HM DEXA SCAN

## 2017-06-24 NOTE — Telephone Encounter (Signed)
Left message for pt to order MRI disc to be sent to our office.

## 2017-06-24 NOTE — Telephone Encounter (Signed)
I'm a pt of Dr. Stephenie Acres with a broken right toe. I had an MRI done at [facility] on Thursday and they were to send the results to Dr. Milinda Pointer. I was following up to see what the next step would be to make sure Dr. Milinda Pointer gets to read the MRI and give me his recommendation as far as the next step. This is in comparison to what [facility] is also suggesting. This is what Dr. Milinda Pointer asked me to do the last time I was there so that's where we are. You can call me back at [phone]. Thank you.

## 2017-06-24 NOTE — Telephone Encounter (Signed)
Have not seen it that I know of.

## 2017-06-29 ENCOUNTER — Encounter: Payer: Self-pay | Admitting: Family Medicine

## 2017-06-29 DIAGNOSIS — M810 Age-related osteoporosis without current pathological fracture: Secondary | ICD-10-CM | POA: Insufficient documentation

## 2017-07-01 ENCOUNTER — Encounter: Payer: Self-pay | Admitting: Family Medicine

## 2017-07-01 ENCOUNTER — Ambulatory Visit: Payer: 59 | Admitting: Family Medicine

## 2017-07-01 VITALS — BP 142/88 | HR 79 | Temp 98.1°F | Ht 65.0 in | Wt 177.4 lb

## 2017-07-01 DIAGNOSIS — M8000XA Age-related osteoporosis with current pathological fracture, unspecified site, initial encounter for fracture: Secondary | ICD-10-CM | POA: Diagnosis not present

## 2017-07-01 DIAGNOSIS — I890 Lymphedema, not elsewhere classified: Secondary | ICD-10-CM | POA: Diagnosis not present

## 2017-07-01 DIAGNOSIS — K219 Gastro-esophageal reflux disease without esophagitis: Secondary | ICD-10-CM

## 2017-07-01 NOTE — Progress Notes (Signed)
Subjective:  Gregory Phillips. is a 62 y.o. year old very pleasant male patient who presents for/with See problem oriented charting ROS- has right foot pain but improved overall. No fever or chills. No recent falls.    Past Medical History-  Patient Active Problem List   Diagnosis Date Noted  . Osteoporosis 06/29/2017    Priority: High  . Recurrent cellulitis of lower leg 08/15/2014    Priority: High  . Atopic eczema 03/21/2007    Priority: High  . Lymphedema     Priority: Medium  . Other abnormal glucose 08/13/2013    Priority: Medium  . Asthma 03/28/2009    Priority: Medium  . Seminoma of descended right testis ([place]) 03/21/2007    Priority: Medium  . HYPERLIPIDEMIA 03/21/2007    Priority: Medium  . Erectile dysfunction 12/09/2015    Priority: Low  . GERD (gastroesophageal reflux disease) 12/09/2015    Priority: Low  . Hair loss 08/23/2014    Priority: Low  . Septic thrombophlebitis 08/15/2014    Priority: Low  . History of adenomatous polyp of colon 03/28/2009    Priority: Low  . Eczema 06/29/2012   Medications- reviewed and updated Current Outpatient Medications  Medication Sig Dispense Refill  . augmented betamethasone dipropionate (DIPROLENE-AF) 0.05 % ointment APPLY TO AFFECTED AREA AT BEDTIME 45 g 0  . betamethasone dipropionate (DIPROLENE) 0.05 % ointment APPLY TO THE AFECTED AREA D  0  . desonide (DESOWEN) 0.05 % ointment APPLY TO AFFECTED AREA AT BEDTIME. 60 g 5  . DUPIXENT 300 MG/2ML SOSY     . EPINEPHrine 0.3 mg/0.3 mL IJ SOAJ injection Inject 0.3 mg into the muscle once.    . fluticasone (FLONASE) 50 MCG/ACT nasal spray Place 1 spray into both nostrils daily.    . ranitidine (ZANTAC) 150 MG tablet Take 150 mg by mouth daily as needed.      Current Facility-Administered Medications  Medication Dose Route Frequency Provider Last Rate Last Dose  . 0.9 %  sodium chloride infusion  500 mL Intravenous Once Pyrtle, Lajuan Lines, MD        Objective: BP (!)  142/88 (BP Location: Left Arm, Patient Position: Sitting, Cuff Size: Normal)   Pulse 79   Temp 98.1 F (36.7 C) (Oral)   Ht 5\' 5"  (1.651 m)   Wt 177 lb 6.4 oz (80.5 kg)   SpO2 97%   BMI 29.52 kg/m  Gen: NAD, resting comfortably In cam walker right foot  Assessment/Plan:  Osteoporosis S: followed by [facility]- broke 5th toe by trauma with slamming toe into something- for about 8 weeks wore a cam walker. . Pain started in midfoot after getting out of boot. Had MRI and found stress fracture.   Due to the stress fracture, bone density was done which shows -2.9 at AP spine therefore osteoporosis.   He was advised to follow up with PCP but was told specifically Cant be on fosamax or other bisphosponates.   As far as risk factors- he apparently did have a fair amount of oral steroids at young age due to dermatitis. He was never a smoker. He could have low vitamin D- recently placed on vitamin D 5000 units, on k2 and calcium- just since stress fracture. He does drink 3-6 beers a night. Discussed cut to 2.   Possible low testosterone. Has ok libido but difficulty with erections even with viagra. No morning erections either A/P: I want to see if patient has secondary osteoporosis from hypogonadism first  and foremost or if vitamin D particularly low. Then will heavily consider prolia given strong ortho preference for no bisphosphonate with recent fracture. He will return tomorrow morning for labs below. Per up to date no interaction with dupixent which he uses for his skin  Lymphedema Wrote for new compression garment up to 40 mmhg for his left leg  GERD (gastroesophageal reflux disease) Patient has some cough which could be related to GERD. Saw allergist and even with maximal treatment cough continued as well as breakthrough reflux with zantac. Now with osteoporosis and poor bone healing- we opted not to pursue PPI at this time (may not ever really be a great idea)    Future Appointments   Date Time Provider [facility]  07/02/2017  8:45 AM [facility]  07/06/2017  3:45 PM Hyatt, Max T, DPM [facility]   Lab/Order associations: Osteoporosis with current pathological fracture, unspecified osteoporosis type, initial encounter - Plan: Comprehensive metabolic panel, CBC, Testosterone Total,Free,Bio, Males, VITAMIN D 25 Hydroxy (Vit-D Deficiency, Fractures)  Return precautions advised.  Garret Reddish, MD

## 2017-07-01 NOTE — Assessment & Plan Note (Addendum)
S: followed by [facility]- broke 5th toe by trauma with slamming toe into something- for about 8 weeks wore a cam walker. . Pain started in midfoot after getting out of boot. Had MRI and found stress fracture.   Due to the stress fracture, bone density was done which shows -2.9 at AP spine therefore osteoporosis.   He was advised to follow up with PCP but was told specifically Cant be on fosamax or other bisphosponates.   As far as risk factors- he apparently did have a fair amount of oral steroids at young age due to dermatitis. He was never a smoker. He could have low vitamin D- recently placed on vitamin D 5000 units, on k2 and calcium- just since stress fracture. He does drink 3-6 beers a night. Discussed cut to 2.   Possible low testosterone. Has ok libido but difficulty with erections even with viagra. No morning erections either A/P: I want to see if patient has secondary osteoporosis from hypogonadism first and foremost or if vitamin D particularly low. Then will heavily consider prolia given strong ortho preference for no bisphosphonate with recent fracture. He will return tomorrow morning for labs below. Per up to date no interaction with dupixent which he uses for his skin

## 2017-07-01 NOTE — Patient Instructions (Addendum)
Health Maintenance Due  Topic Date Due  . TETANUS/TDAP-Patient Declined    Lets have you come back fasting between 8-9 Am tomorrow morning. That is the best time to get testosterone levels. I will order vitamin D and kidney and liver tests as well at that time.   I will think over the options- I think prolia is our main option here. Feel free to discuss with Dr. Milinda Pointer as well- hopefully we will have testosterone labs back early next week.   Schedule a lab visit at the check out desk for tomorrow. Return for future fasting labs meaning nothing but water after midnight please. Ok to take your medications with water.

## 2017-07-01 NOTE — Assessment & Plan Note (Signed)
Wrote for new compression garment up to 40 mmhg for his left leg

## 2017-07-01 NOTE — Assessment & Plan Note (Signed)
Patient has some cough which could be related to GERD. Saw allergist and even with maximal treatment cough continued as well as breakthrough reflux with zantac. Now with osteoporosis and poor bone healing- we opted not to pursue PPI at this time (may not ever really be a great idea)

## 2017-07-02 ENCOUNTER — Other Ambulatory Visit (INDEPENDENT_AMBULATORY_CARE_PROVIDER_SITE_OTHER): Payer: 59

## 2017-07-02 DIAGNOSIS — M8000XA Age-related osteoporosis with current pathological fracture, unspecified site, initial encounter for fracture: Secondary | ICD-10-CM

## 2017-07-02 LAB — COMPREHENSIVE METABOLIC PANEL
ALBUMIN: 3.9 g/dL (ref 3.5–5.2)
ALT: 21 U/L (ref 0–53)
AST: 21 U/L (ref 0–37)
Alkaline Phosphatase: 62 U/L (ref 39–117)
BILIRUBIN TOTAL: 1.1 mg/dL (ref 0.2–1.2)
BUN: 13 mg/dL (ref 6–23)
CALCIUM: 9.3 mg/dL (ref 8.4–10.5)
CO2: 29 mEq/L (ref 19–32)
CREATININE: 0.82 mg/dL (ref 0.40–1.50)
Chloride: 102 mEq/L (ref 96–112)
GFR: 101.97 mL/min (ref >60.00)
Glucose, Bld: 90 mg/dL (ref 70–99)
Potassium: 4.1 mEq/L (ref 3.5–5.1)
Sodium: 140 mEq/L (ref 135–145)
Total Protein: 7.3 g/dL (ref 6.0–8.3)

## 2017-07-02 LAB — CBC
HCT: 44.3 % (ref 39.0–52.0)
HEMOGLOBIN: 15 g/dL (ref 13.0–17.0)
MCHC: 33.9 g/dL (ref 30.0–36.0)
MCV: 89.3 fl (ref 78.0–100.0)
PLATELETS: 268 10*3/uL (ref 150.0–400.0)
RBC: 4.97 Mil/uL (ref 4.22–5.81)
RDW: 13.2 % (ref 11.5–15.5)
WBC: 7.2 10*3/uL (ref 4.0–10.5)

## 2017-07-02 LAB — VITAMIN D 25 HYDROXY (VIT D DEFICIENCY, FRACTURES): VITD: 63.01 ng/mL (ref 30.00–100.00)

## 2017-07-03 NOTE — Progress Notes (Signed)
Gregory Phillips- I spoke with Dr. Paulla Fore and we agreed we should start prolia even if testosterone is low (which we wont know until next week). Can you help coordinate to star this if [name] agrees. Also have him tell his orthopedist that this is our plan.   Vitamin D looks great, blood counts, kidney, liver, calcium all normal.

## 2017-07-06 ENCOUNTER — Encounter: Payer: Self-pay | Admitting: Podiatry

## 2017-07-06 ENCOUNTER — Ambulatory Visit: Payer: 59 | Admitting: Podiatry

## 2017-07-06 ENCOUNTER — Ambulatory Visit (INDEPENDENT_AMBULATORY_CARE_PROVIDER_SITE_OTHER): Payer: 59

## 2017-07-06 DIAGNOSIS — S92501D Displaced unspecified fracture of right lesser toe(s), subsequent encounter for fracture with routine healing: Secondary | ICD-10-CM | POA: Diagnosis not present

## 2017-07-06 LAB — TESTOSTERONE TOTAL,FREE,BIO, MALES
ALBUMIN MSPROF: 3.8 g/dL (ref 3.6–5.1)
Sex Hormone Binding: 38 nmol/L (ref 22–77)
TESTOSTERONE: 234 ng/dL — AB (ref 250–827)

## 2017-07-07 ENCOUNTER — Other Ambulatory Visit: Payer: Self-pay

## 2017-07-07 ENCOUNTER — Encounter: Payer: Self-pay | Admitting: Podiatry

## 2017-07-07 ENCOUNTER — Encounter: Payer: Self-pay | Admitting: Family Medicine

## 2017-07-07 DIAGNOSIS — R7989 Other specified abnormal findings of blood chemistry: Secondary | ICD-10-CM

## 2017-07-07 NOTE — Progress Notes (Signed)
He presents today for follow-up of his fractured fifth toe right.  Just recently had an MRI done and is currently having blood work done for low testosterone.  He has had a DEXA scan recently which demonstrated low bone density or osteoporosis.  He states that his toe pain is improving he still has moderate pain across the forefoot.  Objective: Vital signs are stable alert and oriented x3.  Much decrease in edema in the forefoot and along the fifth toe of the right foot.  Presents today wearing his Cam walker.  I reviewed the lab work the MRI and the DEXA scan which does demonstrate osteoporosis and what appeared to be stress reactions of the lesser metatarsals.  Fifth digit appears to be healing.  We also took radiographs in the office today demonstrating similar findings no major fractures of the left moderate lesser metatarsals and the fifth digit is slowly starting to heal.  Assessment: Slowly healing metatarsalgia and stress reactions to the bones.  Slowly healing fifth digit of the right foot.  Plan: I feel that at this point he can stop wearing his Cam walker get back to regular shoe gear.  I expressed to him that I think it be necessary to get his testosterone levels perfect as possible and then start on a biological such as Prolia or something like that to help increase his bone density relatively quickly.  I feel that once he is maintaining his construct his testosterone with no other parathyroid issues and I would recommend discontinuing the Prolia once density is adequate.

## 2017-07-08 ENCOUNTER — Telehealth: Payer: Self-pay | Admitting: *Deleted

## 2017-07-08 ENCOUNTER — Telehealth: Payer: Self-pay

## 2017-07-08 NOTE — Telephone Encounter (Signed)
Patient was treated for testicular cancer about 12 years ago by Dr Marin Olp. He has recently been diagnosed with osteoporosis. As part of his workup, they tested his testosterone which came back low. His physician would like to start testosterone supplements. Patient wants to make sure this is okay with his cancer history.  Reviewed with Dr Marin Olp. Patient is safe to receive testosterone supplements. The only contraindication would be prostate cancer.   Patient understands this.

## 2017-07-08 NOTE — Telephone Encounter (Signed)
Patient returning call. Please call with results.

## 2017-07-08 NOTE — Telephone Encounter (Signed)
Charted in result notes. 

## 2017-07-08 NOTE — Telephone Encounter (Signed)
Called patient and left a message to call office back. Due to 3 attempts to contact patient I will mail a copy of results to patient.

## 2017-07-12 ENCOUNTER — Other Ambulatory Visit: Payer: Self-pay

## 2017-07-13 ENCOUNTER — Telehealth: Payer: Self-pay | Admitting: *Deleted

## 2017-07-13 NOTE — Telephone Encounter (Signed)
Pt states he needs a referral to endocrinologist Dr. Iran Planas - [facility] on [address] in [facility].

## 2017-07-15 ENCOUNTER — Telehealth: Payer: Self-pay

## 2017-07-15 NOTE — Telephone Encounter (Signed)
Copied from [place] [id number]. Topic: General - Other >> Jul 14, 2017 11:40 AM Self, Lillette Boxer wrote: LVM to inform the patient that his referral has been switched to the provider he requested. I advised him to call that office if he hasnt heard from them by Friday 07/16/17.   >> Jul 15, 2017 12:03 PM Wynetta Emery, [place] C wrote: Pt called in to speak with referrals area. Pt says he received vm, pt called referral location and was advised that they never received referral. Pt asked if documentation could be resent?    Please assist further.    [phone]

## 2017-07-20 ENCOUNTER — Encounter: Payer: Self-pay | Admitting: Podiatry

## 2017-07-20 ENCOUNTER — Ambulatory Visit: Payer: 59 | Admitting: Podiatry

## 2017-07-20 ENCOUNTER — Ambulatory Visit (INDEPENDENT_AMBULATORY_CARE_PROVIDER_SITE_OTHER): Payer: 59

## 2017-07-20 DIAGNOSIS — M10372 Gout due to renal impairment, left ankle and foot: Secondary | ICD-10-CM | POA: Diagnosis not present

## 2017-07-20 DIAGNOSIS — M109 Gout, unspecified: Secondary | ICD-10-CM | POA: Diagnosis not present

## 2017-07-20 NOTE — Patient Instructions (Signed)

## 2017-07-20 NOTE — Progress Notes (Signed)
He presents today stating that he had severe pain upon waking up Friday morning while [place].  He states that it this knuckle joint right ear as he points to the first metatarsophalangeal joint states there is red hot swollen including a stand that sheet to touch it.  He states that it started to get better over the past few days and has improved considerably.  Objective: Vital signs are stable he is alert and oriented x3.  Pulses are palpable.  He has good range of motion with mild tenderness on palpation of the first metatarsophalangeal joint.  No other lesions or wounds.  Mild overlying erythema mild edema no cellulitis drainage noted..  Radiographs taken today do not demonstrate any major osseous abnormalities.  Assessment probable gout attack.  Plan: Discussed etiology pathology conservative versus surgical therapy since this is resolving at this time I recommended he continue to stay in his cam walker until completely resolved.  Should this patient calls stating that he has similar symptoms pain to the first metatarsophalangeal joint and was told to have blood work done we need to provide him with a requisition for an arthritis profile as well as a CBC.  He will then be pointed to 1 of our physicians.

## 2017-07-21 ENCOUNTER — Telehealth: Payer: Self-pay | Admitting: *Deleted

## 2017-07-21 NOTE — Telephone Encounter (Signed)
Called and spoke with pt in regards to his request for his x-rays to a CD. I told him I would be happy to do that for him but that he would need to fill out and sign a medical records release form. I told the pt I could e-mail him one, fax one, send one via postage mail, or he could come to the office at his convenience to fill out and sign the form. Pt stated he would come by to complete the form and pick up his CD of x-rays. I asked the pt when he planned on doing that so I could go ahead and burn them to a cd and have them up front for him. Pt stated he is going out of town Friday and asked if tomorrow would be okay. I told him it would. Pt then asked if I could print the report on the bone density test done at [facility] that was faxed to [place]. I told him I could print that out and have up front with his CD of x-rays.

## 2017-07-21 NOTE — Telephone Encounter (Signed)
Pt states has an appt 08/24/2017 with Dr. Tamala Julian and would like to take his x-rays. I informed our records coordinator.

## 2017-07-21 NOTE — Telephone Encounter (Signed)
Pt states he has an appt 08/24/2017 to see Dr. Tamala Julian, and would like to take his x-rays. I told pt is would inform our records coordinator and she would contact him with information concerning his x-rays.

## 2017-07-21 NOTE — Telephone Encounter (Signed)
Dr. Milinda Pointer states he thinks pt has already seen an endocrinologist, and needs to be seen for low testosterone and begin Prolia.

## 2017-07-21 NOTE — Telephone Encounter (Signed)
Pt states he needs a referral to an endocrinologist, Dr. Iran Planas - [facility] on [address] in [place].

## 2017-07-21 NOTE — Progress Notes (Signed)
This encounter was created in error - please disregard.

## 2017-07-27 ENCOUNTER — Telehealth: Payer: Self-pay

## 2017-07-27 NOTE — Telephone Encounter (Signed)
Called and confirmed with patient that he wishes to wait until after he sees Endocrinology on June , 2019 before making a decision regarding Prolia. He will make a decision then

## 2017-07-29 ENCOUNTER — Telehealth: Payer: Self-pay | Admitting: Podiatry

## 2017-07-29 NOTE — Telephone Encounter (Signed)
Who does he think is going to give him the shot?..A shot will be fine next week.

## 2017-07-29 NOTE — Telephone Encounter (Signed)
Pt presented to office to pick up the records and copy of x-ray disc and asked if he could get a cortisone injection. I reviewed Dr. Stephenie Acres recommendation with front staff and told them to schedule pt for next week.

## 2017-07-29 NOTE — Telephone Encounter (Signed)
This message is for the nurse [place]ar [place]. I'm being treated by Dr. Milinda Pointer for a stress fracture across the ball of the right foot. It is not getting better and there was a discussion about getting a cortisone injection to calm the pain down and also to help reduce the inflammation and swelling with hopes it would help heal this thing sooner. It's just been awful and I just wanted to see if that could be the next step. I wanted to know if I could just come in for the shot without seeing him as long as I have his approval? Just need some direction please. Please call me back ASAP at [phone]. Thank you Valery.

## 2017-08-02 DIAGNOSIS — S92514D Nondisplaced fracture of proximal phalanx of right lesser toe(s), subsequent encounter for fracture with routine healing: Secondary | ICD-10-CM | POA: Diagnosis not present

## 2017-08-05 ENCOUNTER — Ambulatory Visit: Payer: 59 | Admitting: Podiatry

## 2017-08-05 ENCOUNTER — Ambulatory Visit: Payer: 59 | Admitting: Sports Medicine

## 2017-08-05 ENCOUNTER — Encounter: Payer: Self-pay | Admitting: Sports Medicine

## 2017-08-05 DIAGNOSIS — M84374A Stress fracture, right foot, initial encounter for fracture: Secondary | ICD-10-CM | POA: Insufficient documentation

## 2017-08-05 DIAGNOSIS — M84374S Stress fracture, right foot, sequela: Secondary | ICD-10-CM

## 2017-08-05 NOTE — Assessment & Plan Note (Signed)
Patient is here from referral from Dr. Noemi Chapel for custom orthotics after patient sustained a stress fracture of the right third metatarsal. -Orthotics made today.  Patient stated there comfort. -Patient is to follow-up PRN.

## 2017-08-05 NOTE — Progress Notes (Signed)
   HPI  CC: Right foot metatarsal stress fracture, third Patient is here for referral from Dr. Noemi Chapel for custom orthotics secondary to a right foot third metatarsal stress fracture.  He states that he has been progressing well through his treatment courses.  He is very excited to begin getting back to his regular activities.  He denies any recent setbacks.  No recent trauma, injury, or falls.  No weakness, numbness, or paresthesias with the exception of a previously noted paresthetic discomfort within the second webspace that was thought to possibly be secondary to a Morton's neuroma.  See HPI and/or previous note for associated ROS.  Objective: BP 130/84   Ht 5\' 5"  (1.651 m)   Wt 170 lb (77.1 kg)   BMI 28.29 kg/m  Gen: NAD, well groomed, a/o x3, normal affect.  CV: Well-perfused. Warm.  Resp: Non-labored.  Neuro: Sensation intact throughout. No gross coordination deficits.  Gait: Nonpathologic posture, unremarkable stride without signs of limp or balance issues. Ankle/Foot, right: TTP noted mildly at the dorsal aspect of the third metatarsal. No visible erythema, swelling, ecchymosis, or bony deformity --however relatively significant swelling of the left lower extremity noted.  Patient states that he has a history of lymphedema in this leg.  Flexible cavus foot deformity bilaterally. No evidence of tibiotalar deviation; Range of motion is full in all directions. Strength is 5/5 in all directions. No tenderness at the insertion/body/myotendinous junction of the Achilles tendon; No tenderness on posterior aspects of lateral and medial malleolus; Stable lateral and medial ligaments; Talar dome nontender; Unremarkable calcaneal squeeze; No plantar calcaneal tenderness; Able to walk 4 steps.   Custom Orthotics: - Patient was fitted for a standard, cushioned, semi-rigid orthotic. - Orthotic was heated and afterward the patient stood on the orthotic blank positioned on the orthotic stand. - The  patient was positioned in subtalar neutral position and 10 degrees of ankle dorsiflexion in a weight bearing stance. - After completion of molding, a stable base was applied to the orthotic blank. - The blank was ground to a stable position for weight bearing. - Size: 9 - Base: Blue EVA - Additional Posting and Padding: Right sided small metatarsal pad placed. - The patient ambulated in these, and they were very comfortable.    Assessment and plan:  Metatarsal stress fracture of right foot Patient is here from referral from Dr. Noemi Chapel for custom orthotics after patient sustained a stress fracture of the right third metatarsal. -Orthotics made today.  Patient stated there comfort. -Patient is to follow-up PRN.   I spent 30 minutes with this patient. Over 50% of visit was spent in counseling and coordination of care for problems with right foot third metatarsal stress fracture and Morton's neuroma.   Elberta Leatherwood, MD,MS [facility] Fellow 08/05/2017 11:46 AM   Patient seen and evaluated with the fellow. I agree with the above plan of care. Follow-up with Dr. Noemi Chapel as scheduled and follow-up with [place] as needed.

## 2017-08-06 ENCOUNTER — Encounter: Payer: Self-pay | Admitting: Sports Medicine

## 2017-08-11 NOTE — Telephone Encounter (Signed)
Endo office received referral after several attempts- no further action needed.

## 2017-08-25 DIAGNOSIS — R7989 Other specified abnormal findings of blood chemistry: Secondary | ICD-10-CM | POA: Diagnosis not present

## 2017-08-26 DIAGNOSIS — R7989 Other specified abnormal findings of blood chemistry: Secondary | ICD-10-CM | POA: Diagnosis not present

## 2017-11-05 DIAGNOSIS — R7989 Other specified abnormal findings of blood chemistry: Secondary | ICD-10-CM | POA: Diagnosis not present

## 2017-11-23 DIAGNOSIS — J4530 Mild persistent asthma, uncomplicated: Secondary | ICD-10-CM | POA: Diagnosis not present

## 2017-11-23 DIAGNOSIS — J301 Allergic rhinitis due to pollen: Secondary | ICD-10-CM | POA: Diagnosis not present

## 2017-11-23 DIAGNOSIS — J3089 Other allergic rhinitis: Secondary | ICD-10-CM | POA: Diagnosis not present

## 2017-11-27 DIAGNOSIS — R7989 Other specified abnormal findings of blood chemistry: Secondary | ICD-10-CM | POA: Diagnosis not present

## 2017-12-13 DIAGNOSIS — T560X1A Toxic effect of lead and its compounds, accidental (unintentional), initial encounter: Secondary | ICD-10-CM | POA: Diagnosis not present

## 2017-12-30 ENCOUNTER — Ambulatory Visit: Payer: 59 | Admitting: Podiatry

## 2017-12-30 ENCOUNTER — Ambulatory Visit (INDEPENDENT_AMBULATORY_CARE_PROVIDER_SITE_OTHER): Payer: 59

## 2017-12-30 ENCOUNTER — Other Ambulatory Visit: Payer: Self-pay | Admitting: Podiatry

## 2017-12-30 DIAGNOSIS — M79671 Pain in right foot: Secondary | ICD-10-CM

## 2017-12-30 DIAGNOSIS — M779 Enthesopathy, unspecified: Secondary | ICD-10-CM

## 2017-12-30 DIAGNOSIS — M7751 Other enthesopathy of right foot: Secondary | ICD-10-CM | POA: Diagnosis not present

## 2017-12-30 DIAGNOSIS — M778 Other enthesopathies, not elsewhere classified: Secondary | ICD-10-CM

## 2017-12-30 MED ORDER — METHYLPREDNISOLONE 4 MG PO TBPK: ORAL_TABLET | ORAL | 0 refills | Status: DC

## 2017-12-30 NOTE — Progress Notes (Signed)
He presents today states that his left foot has been becoming sore over the past 4 weeks or so.  He is concerned that a possible stress fracture or possibly even gout.  He had fluid drawn off of his left knee earlier this week.  Her graph objective: Vital signs are stable alert and oriented x3.  Pulses are palpable.  Pain on palpation to the anterior medial aspect of the right ankle.  Range of motion is normal and full is no pain on palpation or range of motion and strength of any of his tendons.  Radiographs taken today do not demonstrate any type of osseous abnormality of the soft tissue inflammation and fluid retention in the anterior ankle.  Assessment: Capsulitis of the anteromedial ankle.  Plan: After sterile Betadine skin prep injected 20 mg Kenalog 5 numbers Marcaine point of maximal tenderness.  Tolerated procedure well without complications as he was leaving the office and states that he felt 100% improved.  Encouraged him to get back pick up a prescription for the Medrol Dosepak that we called in and to get into his cam walker.  I will follow-up with him in 1 month if this is not resolved an MRI will be necessary.

## 2018-01-05 DIAGNOSIS — R7989 Other specified abnormal findings of blood chemistry: Secondary | ICD-10-CM | POA: Diagnosis not present

## 2018-01-18 ENCOUNTER — Telehealth: Payer: Self-pay | Admitting: *Deleted

## 2018-01-18 ENCOUNTER — Ambulatory Visit: Payer: 59 | Admitting: Podiatry

## 2018-01-18 DIAGNOSIS — M109 Gout, unspecified: Secondary | ICD-10-CM

## 2018-01-18 DIAGNOSIS — M19079 Primary osteoarthritis, unspecified ankle and foot: Secondary | ICD-10-CM | POA: Diagnosis not present

## 2018-01-18 DIAGNOSIS — M778 Other enthesopathies, not elsewhere classified: Secondary | ICD-10-CM

## 2018-01-18 DIAGNOSIS — M10372 Gout due to renal impairment, left ankle and foot: Secondary | ICD-10-CM

## 2018-01-18 DIAGNOSIS — M779 Enthesopathy, unspecified: Secondary | ICD-10-CM

## 2018-01-18 MED ORDER — TRAMADOL HCL 50 MG PO TABS: 50.0000 mg | ORAL_TABLET | Freq: Four times a day (QID) | ORAL | 0 refills | Status: AC | PRN

## 2018-01-18 NOTE — Telephone Encounter (Signed)
Orders to J. Quintana, RN for pre-cert, faxed to [facility]. 

## 2018-01-18 NOTE — Progress Notes (Signed)
He presents today states that the injection really did not help as he refers to the right ankle.  Objective: Vital signs are stable he is alert and oriented x3.  Pulses are palpable.  Dorsiflexion is painful on end range of motion of the ankle.  He has pain in the medial gutter.  All conservative therapies have failed.  Assessment: Cannot rule out osteochondral defect right ankle medial aspect.  Plan: Consider surgical intervention after MRI.  Requesting MRI of the ankle joint

## 2018-01-18 NOTE — Telephone Encounter (Signed)
-----   Message from Rip Harbour, [facility] sent at 01/18/2018 12:34 PM EDT ----- MRI ankle right - evaluate ankle arthritis right - surgical consideration

## 2018-01-19 ENCOUNTER — Telehealth: Payer: Self-pay | Admitting: Podiatry

## 2018-01-19 NOTE — Telephone Encounter (Signed)
Patient is schedule for MRI , but they need prior authorization. We received call from [place] does not have the word from [employer].

## 2018-01-19 NOTE — Telephone Encounter (Signed)
I informed pt's wife, Marliss Coots the orders had been pulled and given to the Pre-cert coordinator, but it had not been pre-certed yet. Belinda asked to be contacted once the pre-cert had been received.

## 2018-01-20 ENCOUNTER — Telehealth: Payer: Self-pay | Admitting: *Deleted

## 2018-01-20 NOTE — Telephone Encounter (Signed)
[place] states pt's wife is calling to see if the pre-cert had been started. I told Adwanda the orders had been place 01/18/2018 and given to the [employer] same day. I told Adwanda I would be happy to start the pre-cert. Adwanda transferred to the pre-cert department. [place] states radiology is managed through [facility] and transferred to [place]. [place], BRIANNA AUTHORIZED MRI OF RIGHT ANKLE [id number] PA# [id number] VALID FOR 15 DAYS UNTIL 03/06/2018.

## 2018-01-20 NOTE — Telephone Encounter (Signed)
Faxed orders with authorization to [facility]. Informed pt's wife, Marliss Coots of the authorization.

## 2018-01-22 ENCOUNTER — Ambulatory Visit
Admission: RE | Admit: 2018-01-22 | Discharge: 2018-01-22 | Disposition: A | Discharge status: Home / Self Care | Payer: 59 | Source: Ambulatory Visit | Attending: Podiatry | Admitting: Podiatry

## 2018-01-22 DIAGNOSIS — M779 Enthesopathy, unspecified: Secondary | ICD-10-CM

## 2018-01-22 DIAGNOSIS — M19079 Primary osteoarthritis, unspecified ankle and foot: Secondary | ICD-10-CM

## 2018-01-22 DIAGNOSIS — M19071 Primary osteoarthritis, right ankle and foot: Secondary | ICD-10-CM | POA: Diagnosis not present

## 2018-01-22 DIAGNOSIS — M10372 Gout due to renal impairment, left ankle and foot: Secondary | ICD-10-CM

## 2018-01-22 DIAGNOSIS — M109 Gout, unspecified: Secondary | ICD-10-CM

## 2018-01-22 DIAGNOSIS — M778 Other enthesopathies, not elsewhere classified: Secondary | ICD-10-CM

## 2018-01-26 ENCOUNTER — Telehealth: Payer: Self-pay | Admitting: *Deleted

## 2018-01-26 NOTE — Telephone Encounter (Signed)
I informed pt of Dr. Stephenie Acres request to send the MRI disc copy to a radiology specialist for more details for treatment planning and there would be a 10-14 day delay. Pt asked what to due until he was seen and I told pt to remain in the stabilizing boot and keep appt 02/03/2018, that I would put a [place] on the report. Pt states Dr. Milinda Pointer had told him to be only in the boot when he was unable to tolerate being out of the boot. I told pt I would ask Dr. Milinda Pointer for clarification of the boot orders.

## 2018-01-26 NOTE — Telephone Encounter (Signed)
Sounds good

## 2018-01-26 NOTE — Telephone Encounter (Signed)
-----   Message from Garrel Ridgel, [place] sent at 01/25/2018  7:44 AM EST ----- Send for over reand and inform patient of the delay please.

## 2018-01-31 ENCOUNTER — Encounter: Payer: Self-pay | Admitting: Podiatry

## 2018-01-31 DIAGNOSIS — E291 Testicular hypofunction: Secondary | ICD-10-CM | POA: Diagnosis not present

## 2018-02-03 ENCOUNTER — Telehealth: Payer: Self-pay | Admitting: *Deleted

## 2018-02-03 ENCOUNTER — Ambulatory Visit: Payer: 59 | Admitting: Podiatry

## 2018-02-03 NOTE — Telephone Encounter (Signed)
I spoke with Gregory Phillips and informed of Dr. Stephenie Acres review of results and referral to Dr. Amalia Hailey. Gregory Phillips states he has been communicating with Dr. Milinda Pointer and seeing Dr. Noemi Chapel. Gregory Phillips states Dr. Noemi Chapel has referred to Dr. Doran Durand, and asked how that fits in. I told Gregory Phillips that he could definitely get a 2nd opinion, but Dr. Milinda Pointer was referring Gregory Phillips to Dr. Amalia Hailey in our practice. Gregory Phillips asked if he needed to remain in the boot. I told Gregory Phillips as a rule, Gregory Phillips's are instructed to continue the therapy the doctor has them on, until seen in office.

## 2018-02-03 NOTE — Telephone Encounter (Signed)
Good.

## 2018-02-03 NOTE — Telephone Encounter (Signed)
Dr. Milinda Pointer states after reviewing pt's MRI final results, he feels pt may need ankle surgery and would like pt to schedule with Dr. Amalia Hailey.

## 2018-02-03 NOTE — Telephone Encounter (Signed)
Left message on pt's mobile line to call for information, voice mail message identified as Liliane Channel.

## 2018-02-04 ENCOUNTER — Encounter: Payer: Self-pay | Admitting: Podiatry

## 2018-02-08 ENCOUNTER — Encounter: Payer: Self-pay | Admitting: Podiatry

## 2018-02-08 DIAGNOSIS — R7989 Other specified abnormal findings of blood chemistry: Secondary | ICD-10-CM | POA: Diagnosis not present

## 2018-02-11 DIAGNOSIS — M8000XG Age-related osteoporosis with current pathological fracture, unspecified site, subsequent encounter for fracture with delayed healing: Secondary | ICD-10-CM | POA: Diagnosis not present

## 2018-02-11 DIAGNOSIS — R7989 Other specified abnormal findings of blood chemistry: Secondary | ICD-10-CM | POA: Diagnosis not present

## 2018-02-14 ENCOUNTER — Encounter: Payer: Self-pay | Admitting: Podiatry

## 2018-02-14 ENCOUNTER — Ambulatory Visit: Payer: 59 | Admitting: Podiatry

## 2018-02-14 DIAGNOSIS — E291 Testicular hypofunction: Secondary | ICD-10-CM | POA: Diagnosis not present

## 2018-02-14 DIAGNOSIS — M87076 Idiopathic aseptic necrosis of unspecified foot: Secondary | ICD-10-CM | POA: Diagnosis not present

## 2018-02-14 NOTE — Progress Notes (Signed)
   HPI: 62 year old male presents the office today as a referral from Dr. Milinda Pointer for evaluation of right foot pain.  Patient has had this ongoing pain since September of this year.  He has been referred back and forth between [place] and [facility].  He recently had an MRI ordered by Dr. Milinda Pointer and performed on 01/22/2018.  He presents today to review MRI results and discuss possible surgical intervention.  He has been wearing the immobilization cam boot for the past week and he notices significant improvement in regards to the pain today.  He has been taking over-the-counter Motrin with relief  Past Medical History:  Diagnosis Date  . Allergy   . Asthma    in past  . Cellulitis 2001    LLE ; [facility]  . Cellulitis 2007   R elbow ; S/ P drainage by Dr Telford Nab  . Cellulitis 2014   with sepsis  . Cellulitis 08/09/2014   LLE  . Eczema   . Edema   . Recurrent cellulitis of lower leg 08/15/2014  . Septic thrombophlebitis 08/15/2014  . Testicular cancer [facility] 2004   Dr Jeffie Pollock     Physical Exam: General: The patient is alert and oriented x3 in no acute distress.  Dermatology: Skin is warm, dry and supple bilateral lower extremities. Negative for open lesions or macerations.  Vascular: Palpable pedal pulses bilaterally. No edema or erythema noted. Capillary refill within normal limits.  Neurological: Epicritic and protective threshold grossly intact bilaterally.   Musculoskeletal Exam: There is some pain on palpation and range of motion to the talonavicular joint right foot consistent with MRI findings range of motion within normal limits to all pedal and ankle joints bilateral. Muscle strength 5/5 in all groups bilateral.   MRI impression 01/22/2018:  1. Mild osteoarthritis of the talonavicular joint with thickening of the talonavicular ligament. Severe marrow edema in the anterior talus disproportionate to the degree of talonavicular osteoarthritis. Mild subchondral  linear T1 hypointensity and T2 hyperintensity may reflect a subchondral fracture or avascular necrosis versus stress reaction. 2. Moderate tendinosis of the peroneus brevis with a short-segment longitudinal split tear of the level of the lateral malleolus.   Assessment: 1.  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  Promius brevis longitudinal splits tear right-nonsymptomatic   Plan of Care:  1. Patient evaluated.  MRI reviewed.  2.  Today explained the patient the nature of bone marrow edema and possible AVN to the talar head right.  Continue conservative treatment at the moment.  Continue wearing immobilization cam boot for an additional 4 to 6 weeks.  Continue over-the-counter Motrin as needed. 3.  Return to clinic in 8 weeks for follow-up.  We may need to get new MRI impression after 3 months to see improvement of the talus and compared to exam taken on 01/22/2018      Edrick Kins, DPM [facility]  Dr. Edrick Kins, DPM    [address], [place] [address]                Office [phone]  Fax [phone]

## 2018-02-23 DIAGNOSIS — M84374A Stress fracture, right foot, initial encounter for fracture: Secondary | ICD-10-CM | POA: Diagnosis not present

## 2018-03-02 DIAGNOSIS — E291 Testicular hypofunction: Secondary | ICD-10-CM | POA: Diagnosis not present

## 2018-03-15 DIAGNOSIS — E291 Testicular hypofunction: Secondary | ICD-10-CM | POA: Diagnosis not present

## 2018-03-29 DIAGNOSIS — E291 Testicular hypofunction: Secondary | ICD-10-CM | POA: Diagnosis not present

## 2018-04-06 DIAGNOSIS — R7989 Other specified abnormal findings of blood chemistry: Secondary | ICD-10-CM | POA: Diagnosis not present

## 2018-04-11 ENCOUNTER — Encounter: Payer: 59 | Admitting: Podiatry

## 2018-04-12 DIAGNOSIS — E291 Testicular hypofunction: Secondary | ICD-10-CM | POA: Diagnosis not present

## 2018-04-24 NOTE — Progress Notes (Signed)
This encounter was created in error - please disregard.

## 2018-04-25 DIAGNOSIS — E291 Testicular hypofunction: Secondary | ICD-10-CM | POA: Diagnosis not present

## 2018-04-27 DIAGNOSIS — M79671 Pain in right foot: Secondary | ICD-10-CM | POA: Diagnosis not present

## 2018-04-27 DIAGNOSIS — M84374A Stress fracture, right foot, initial encounter for fracture: Secondary | ICD-10-CM | POA: Diagnosis not present

## 2018-04-27 DIAGNOSIS — M79672 Pain in left foot: Secondary | ICD-10-CM | POA: Diagnosis not present

## 2018-05-09 DIAGNOSIS — E291 Testicular hypofunction: Secondary | ICD-10-CM | POA: Diagnosis not present

## 2018-05-23 DIAGNOSIS — E291 Testicular hypofunction: Secondary | ICD-10-CM | POA: Diagnosis not present

## 2018-07-11 DIAGNOSIS — E291 Testicular hypofunction: Secondary | ICD-10-CM | POA: Diagnosis not present

## 2018-07-15 ENCOUNTER — Telehealth: Payer: Self-pay | Admitting: Family Medicine

## 2018-07-15 NOTE — Telephone Encounter (Signed)
LVM for patient to call back and schedule a follow appointment with Dr. Yong Channel since he hasn't been seen since 07/01/17.

## 2018-07-25 DIAGNOSIS — E291 Testicular hypofunction: Secondary | ICD-10-CM | POA: Diagnosis not present

## 2018-08-08 DIAGNOSIS — E291 Testicular hypofunction: Secondary | ICD-10-CM | POA: Diagnosis not present

## 2018-08-23 ENCOUNTER — Encounter: Payer: Self-pay | Admitting: Family Medicine

## 2018-08-23 ENCOUNTER — Ambulatory Visit (INDEPENDENT_AMBULATORY_CARE_PROVIDER_SITE_OTHER): Payer: 59 | Admitting: Family Medicine

## 2018-08-23 VITALS — Ht 65.0 in | Wt 170.0 lb

## 2018-08-23 DIAGNOSIS — R05 Cough: Secondary | ICD-10-CM

## 2018-08-23 DIAGNOSIS — K219 Gastro-esophageal reflux disease without esophagitis: Secondary | ICD-10-CM

## 2018-08-23 DIAGNOSIS — M816 Localized osteoporosis [Lequesne]: Secondary | ICD-10-CM | POA: Diagnosis not present

## 2018-08-23 DIAGNOSIS — C6211 Malignant neoplasm of descended right testis: Secondary | ICD-10-CM

## 2018-08-23 DIAGNOSIS — R053 Chronic cough: Secondary | ICD-10-CM

## 2018-08-23 MED ORDER — OMEPRAZOLE 40 MG PO CPDR: 40.0000 mg | DELAYED_RELEASE_CAPSULE | Freq: Every day | ORAL | 1 refills | Status: DC

## 2018-08-23 NOTE — Progress Notes (Addendum)
Phone [phone]   Subjective:  Virtual visit via Video note. Chief complaint: Chief Complaint  Patient presents with   Abdominal Pain    Pt claim to have pain around pelvic area. Pt stated that the pain is tender whether it is "touched or not"    Cough    pt has a hx of cough followed by allery clinic w/o relief. pt stated he is wanting to know what his next step is.    This visit type was conducted due to national recommendations for restrictions regarding the COVID-19 Pandemic (e.g. social distancing).  This format is felt to be most appropriate for this patient at this time balancing risks to patient and risks to population by having him in for in person visit.  No physical exam was performed (except for noted visual exam or audio findings with Telehealth visits).    Our team/I connected with Gregory Phillips. at  3:00 PM EDT by a video enabled telemedicine application (doxy.me or caregility through epic) and verified that I am speaking with the correct person using two identifiers.  Location patient: [place] Location provider: [facility], office Persons participating in the virtual visit:  patient  Our team/I discussed the limitations of evaluation and management by telemedicine and the availability of in person appointments. In light of current covid-19 pandemic, patient also understands that we are trying to protect them by minimizing in office contact if at all possible.  The patient expressed consent for telemedicine visit and agreed to proceed. Patient understands insurance will be billed.   ROS- no dysuria, polyuria, constipation, fever, chills, worsening cough, anosmia   Past Medical History-  Patient Active Problem List   Diagnosis Date Noted   Osteoporosis 06/29/2017    Priority: High   Recurrent cellulitis of lower leg 08/15/2014    Priority: High   Atopic eczema 03/21/2007    Priority: High   Lymphedema     Priority: Medium   Other abnormal glucose  08/13/2013    Priority: Medium   Asthma 03/28/2009    Priority: Medium   Seminoma of descended right testis ([place]) 03/21/2007    Priority: Medium   HYPERLIPIDEMIA 03/21/2007    Priority: Medium   Erectile dysfunction 12/09/2015    Priority: Low   GERD (gastroesophageal reflux disease) 12/09/2015    Priority: Low   Hair loss 08/23/2014    Priority: Low   Septic thrombophlebitis 08/15/2014    Priority: Low   History of adenomatous polyp of colon 03/28/2009    Priority: Low   Low testosterone in male 08/25/2017   Metatarsal stress fracture of right foot 08/05/2017   Eczema 06/29/2012    Medications- reviewed and updated Current Outpatient Medications  Medication Sig Dispense Refill   augmented betamethasone dipropionate (DIPROLENE-AF) 0.05 % ointment APPLY TO AFFECTED AREA AT BEDTIME 45 g 0   betamethasone dipropionate (DIPROLENE) 0.05 % ointment APPLY TO THE AFECTED AREA D  0   desonide (DESOWEN) 0.05 % ointment APPLY TO AFFECTED AREA AT BEDTIME. 60 g 5   DUPIXENT 300 MG/2ML SOSY      EPINEPHrine 0.3 mg/0.3 mL IJ SOAJ injection Inject 0.3 mg into the muscle once.     fluticasone (FLONASE) 50 MCG/ACT nasal spray Place 1 spray into both nostrils daily.     Testosterone Cypionate 200 MG/ML SOLN      omeprazole (PRILOSEC) 40 MG capsule Take 1 capsule (40 mg total) by mouth daily. 30 capsule 1   No current facility-administered medications for this  visit.      Objective:  Ht 5\' 5"  (1.651 m)    Wt 170 lb (77.1 kg)    BMI 28.29 kg/m  self reported vitals Gen: NAD, resting comfortably Lungs: nonlabored, normal respiratory rate  Skin: appears dry, no obvious rash     Assessment and Plan   # Chronic cough/possible GERD S: patient with continued cough issues. Patient now with chronic cough dating to 2017. We attempted to treat with advair for underlying asthma September 2017 but ultimately referred to Dr. Donneta Romberg given lack of improvement. Was referred to  allergist given ongoing issues in 2017. Asthma and allergies may have contributed at that time but prior dry cough largely resolved. In last 6 months, Has transitioned to more wet cough than back in 2017. Will get some sputum production now.  Dr. Donneta Romberg tried allergy testing but no clear cause found patient reports- suggested referral to Dr. Chase Caller. Tends to be back of throat down to top of his chest. Gets acid reflux and has heartburn- taking prevacid. GERD treatment with no significant benefit at present. Did take zyrtec with no improvement. No runny nose/watery itchy eyes. No sinus discahrge.  A/P: Patient with chronic cough. Not on ace-I. Allergies - has tried treatment with Dr. Donneta Romberg without progress. Dr. Donneta Romberg believes asthma is reasonably treated as well. FOr possible GERD_ will increase to omeprazole 40mg  short term from prevacid- would prefer to avoid high doses long term given osteoporosis history -We will get chest x-ray-he will call to schedule-believe is reasonable to have him in the office since cough is been for 3 years and not recently worsened so low risk for COVID-19-still would be reasonable to have in through the side door -refer to pulmonology as Dr. Donneta Romberg suggested  #Lower abdominal pain/does have history testicular cancer S:  Last 2 weeks some discomfort in lower abdomen. History of cellulitis but usually would present by now if that was the issues. Slightly worse with sitting. Feels better to stand. Having good regular bowel movements. Pain 2/10 constant. Does not hurt worse when pushes on the area. No diarrhea. No burning with peeing or frequent urination. No obvious bulge to suggest hernia- had hernia repair with testicular cancer. No enlarged lymph nodes.   A/P:  possibly deeper muscular strain and possibly related to cough.  Since it is rather mild and has not worsened since it started-we opted to monitor only for now-if he has new or worsening symptoms will let us know.   Patient does have a history of testicular cancer which was removed in 2005-would consider imaging if had worsening symptoms or persistent symptoms over coming months  Osteoporosis S:Saw podiatrist  and referred to endocrinologist (I actually had recommended endocrine referral after I saw low testosterone as well last year instead of starting prolia)- they wanted to treat testosterone before the prolia. Cream on back elevated levels and then transitioned to injections- has been around 500 level now. Bone has healed up in the foot.  Has June visit with endocrinology A/P: Sounds like patient is in good hands-he asks about doing a repeat bone density this year-asked him to get endocrinology opinion-typically I have a Phillips time getting these covered if less than 2-year interval between  Other notes: 1.Wife with ovarian mass- one was size of nerf football and one size of an orange- no cancer thankfully  X-ray when Teressa Senter gets back. Chronic cough for 3 years- patient will wear mask. Does not have other covid symptoms No fever, chills, shortness  of breath, body aches, sore throat, or loss of taste or smell - side door and later in day would be more ideal  - recommend next available physical Lab/Order associations: Chronic cough - Plan: Ambulatory referral to Pulmonology, DG Chest 2 View  Localized osteoporosis without current pathological fracture  Gastroesophageal reflux disease without esophagitis  Seminoma of descended right testis ([place]), Chronic  Meds ordered this encounter  Medications   omeprazole (PRILOSEC) 40 MG capsule    Sig: Take 1 capsule (40 mg total) by mouth daily.    Dispense:  30 capsule    Refill:  1    Return precautions advised.  Garret Reddish, MD

## 2018-08-23 NOTE — Patient Instructions (Addendum)
Health Maintenance Due  Topic Date Due  . TETANUS/TDAP Declined 10/28/1976    Depression screen [facility] 2/9 07/01/2017 07/21/2012  Decreased Interest 0 0  Down, Depressed, Hopeless 0 0  PHQ - 2 Score 0 0  team didn't update- will plan on at next visit  Video visit

## 2018-08-23 NOTE — Assessment & Plan Note (Addendum)
S:Saw podiatrist  and referred to endocrinologist (I actually had recommended endocrine referral after I saw low testosterone as well last year instead of starting prolia)- they wanted to treat testosterone before the prolia. Cream on back elevated levels and then transitioned to injections- has been around 500 level now. Bone has healed up in the foot.  Has June visit with endocrinology A/P: Sounds like patient is in good hands-he asks about doing a repeat bone density this year-asked him to get endocrinology opinion-typically I have a hard time getting these covered if less than 2-year interval between

## 2018-08-24 ENCOUNTER — Ambulatory Visit (INDEPENDENT_AMBULATORY_CARE_PROVIDER_SITE_OTHER): Payer: 59

## 2018-08-24 DIAGNOSIS — R053 Chronic cough: Secondary | ICD-10-CM

## 2018-08-24 DIAGNOSIS — R05 Cough: Secondary | ICD-10-CM

## 2018-08-24 NOTE — Progress Notes (Signed)
Patient is coming today around 3pm for xray (he knows we close at 4pm). We do not schedule them however, he stated that Dr. Yong Channel told him to come in the side door. I provided the number for the patient to call to notify that he is coming to the side door and to ring the bell. I have included Teressa Senter so she is aware that the front will notify her to go get the patient from the side door.   Patient wants to wait to schedule physical

## 2018-08-25 ENCOUNTER — Encounter: Payer: Self-pay | Admitting: Family Medicine

## 2018-09-06 ENCOUNTER — Telehealth: Payer: Self-pay | Admitting: Physical Therapy

## 2018-09-06 NOTE — Telephone Encounter (Signed)
Copied from [facility] [id number]. Topic: Referral - Status >> Sep 05, 2018  4:39 PM Reyne Dumas L wrote: Reason for CRM:  Pt calling to check on referral that should have been placed at last OV to pulmonary.  States he hasn't heard from anyone.

## 2018-09-16 ENCOUNTER — Ambulatory Visit: Payer: 59 | Admitting: Emergency Medicine

## 2018-09-16 ENCOUNTER — Encounter: Payer: Self-pay | Admitting: Emergency Medicine

## 2018-09-16 ENCOUNTER — Other Ambulatory Visit: Payer: Self-pay

## 2018-09-16 VITALS — BP 122/70 | HR 80 | Temp 98.0°F | Ht 65.0 in | Wt 174.0 lb

## 2018-09-16 DIAGNOSIS — J4540 Moderate persistent asthma, uncomplicated: Secondary | ICD-10-CM

## 2018-09-16 DIAGNOSIS — R05 Cough: Secondary | ICD-10-CM

## 2018-09-16 DIAGNOSIS — R053 Chronic cough: Secondary | ICD-10-CM | POA: Insufficient documentation

## 2018-09-16 MED ORDER — BENZONATATE 200 MG PO CAPS: 200.0000 mg | ORAL_CAPSULE | Freq: Three times a day (TID) | ORAL | 2 refills | Status: DC | PRN

## 2018-09-16 MED ORDER — FAMOTIDINE 20 MG PO TABS: 20.0000 mg | ORAL_TABLET | Freq: Two times a day (BID) | ORAL | 5 refills | Status: DC

## 2018-09-16 MED ORDER — FAMOTIDINE 20 MG PO TABS: 20.0000 mg | ORAL_TABLET | Freq: Every day | ORAL | 5 refills | Status: DC

## 2018-09-16 NOTE — Assessment & Plan Note (Signed)
He describes symptoms that sound upper airway in nature.  Treating him more aggressively for GERD has helped some but has not resolved completely.  Complicating issue will be that he is not a great candidate for PPI given his bone density.  He denies overt allergy symptoms (on Dupixent for eczema), may have some throat mucus occasionally.  I think we should try to treat him for all potential upper airway irritants including empirically for allergies.  He needs pulmonary function testing to assess for true bronchospasm.  I will not start inhaled therapy right now, want to avoid anything that would irritate his upper airway.  Please continue omeprazole 40 mg once daily for now.  Take this 1 hour around food Please start Pepcid 20 mg twice a day until next visit. Please start loratadine 10 mg once daily until next visit. Try to avoid throat clearing.  It may be helpful to use sugar-free candy, non-mentholated cough drops.  When you have the desire to clear your throat, to swallow. Try to practice voice rest if possible We will perform full pulmonary function testing at your next visit Continue Dupixent as you have been taking it Follow with Dr Lamonte Sakai in 1 month

## 2018-09-16 NOTE — Assessment & Plan Note (Signed)
Unclear dx. He denied childhood asthma today, but note that he has hx per notes. Need PFT to assess for any degree obstruction as a contributor to his daily sx.

## 2018-09-16 NOTE — Patient Instructions (Signed)
Please continue omeprazole 40 mg once daily for now.  Take this 1 hour around food Please start Pepcid 20 mg twice a day until next visit. Please start loratadine 10 mg once daily until next visit. Try to avoid throat clearing.  It may be helpful to use sugar-free candy, non-mentholated cough drops.  When you have the desire to clear your throat, to swallow. Try to practice voice rest if possible We will perform full pulmonary function testing at your next visit Continue Dupixent as you have been taking it Follow with Dr Lamonte Sakai in 1 month

## 2018-09-16 NOTE — Progress Notes (Signed)
Subjective:    Patient ID: Gregory Hard., male    DOB: Jan 02, 1956, 63 y.o.   MRN: [id number]  HPI 63 year old never smoker with a history of remote treated testicular cancer, hypogonadism, eczema, allergic rhinitis, GERD and chronic cough.  Referred today for evaluation of his chronic cough.  He has been diagnosed with asthma, seen by Dr. Donneta Romberg in [place].  PFTs not available to me right now.  He has been on Advair before, does not appear to be on any bronchodilators at this time.  He was recently changed from Prevacid to Prilosec 40 mg in attempt to treat some breakthrough GERD that was felt to be a contributor.  Apparently has been on Dupixent since 2019 for his eczema. Seems to have helped his skin.   He reports today that he started having cough 3-4 yrs ago, was dry at first. Was tried on allergy meds, Advair. He denies significant dyspnea, may have heard wheeze a few times before. He has cough all day long, sometimes w yellow mucous. Happens in the am with his coffee. Doesn't wake him up at night, when supine. He believes that the change to omeprazole has helped the cough some. Still with some breakthrough    Review of Systems  Constitutional: Negative for fever and unexpected weight change.  HENT: Negative for congestion, dental problem, ear pain, nosebleeds, postnasal drip, rhinorrhea, sinus pressure, sneezing, sore throat and trouble swallowing.   Eyes: Negative for redness and itching.  Respiratory: Positive for cough. Negative for chest tightness, shortness of breath and wheezing.   Cardiovascular: Negative for palpitations and leg swelling.  Gastrointestinal: Negative for nausea and vomiting.  Genitourinary: Negative for dysuria.  Musculoskeletal: Negative for joint swelling.  Skin: Negative for rash.  Neurological: Negative for headaches.  Hematological: Does not bruise/bleed easily.  Psychiatric/Behavioral: Negative for dysphoric mood. The patient is not nervous/anxious.     Past Medical History:  Diagnosis Date  . Allergy   . Asthma    in past  . Cellulitis 2001    LLE ; [facility]  . Cellulitis 2007   R elbow ; S/ P drainage by Dr Telford Nab  . Cellulitis 2014   with sepsis  . Cellulitis 08/09/2014   LLE  . Eczema   . Edema   . Recurrent cellulitis of lower leg 08/15/2014  . Septic thrombophlebitis 08/15/2014  . Testicular cancer [facility]) 2004   Dr Jeffie Pollock     Family History  Problem Relation Age of Onset  . Heart disease Father        dysrrhymthmia  . Colon cancer Father   . Liver cancer Father   . Transient ischemic attack Mother        in 61  . Asthma Sister   . Cancer Paternal Grandfather        stomach  . Eczema Maternal Uncle   . Diabetes Neg Hx   . Colon polyps Neg Hx   . Esophageal cancer Neg Hx   . Rectal cancer Neg Hx   . Stomach cancer Neg Hx      Social History   Socioeconomic History  . Marital status: Married    Spouse name: Not on file  . Number of children: Not on file  . Years of education: Not on file  . Highest education level: Not on file  Occupational History  . Not on file  Social Needs  . Financial resource strain: Not on file  . Food insecurity  Worry: Not on file    Inability: Not on file  . Transportation needs    Medical: Not on file    Non-medical: Not on file  Tobacco Use  . Smoking status: Never Smoker  . Smokeless tobacco: Never Used  Substance and Sexual Activity  . Alcohol use: Yes    Alcohol/week: 12.0 standard drinks    Types: 12 Cans of beer per week    Comment: week  . Drug use: No  . Sexual activity: Yes  Lifestyle  . Physical activity    Days per week: Not on file    Minutes per session: Not on file  . Stress: Not on file  Relationships  . Social Herbalist on phone: Not on file    Gets together: Not on file    Attends religious service: Not on file    Active member of club or organization: Not on file    Attends meetings of clubs or organizations: Not  on file    Relationship status: Not on file  . Intimate partner violence    Fear of current or ex partner: Not on file    Emotionally abused: Not on file    Physically abused: Not on file    Forced sexual activity: Not on file  Other Topics Concern  . Not on file  Social History Narrative  . Not on file   Works in Press [employer]  Has lived in [place], [place]  No Known Allergies   Outpatient Medications Prior to Visit  Medication Sig Dispense Refill  . augmented betamethasone dipropionate (DIPROLENE-AF) 0.05 % ointment APPLY TO AFFECTED AREA AT BEDTIME 45 g 0  . betamethasone dipropionate (DIPROLENE) 0.05 % ointment APPLY TO THE AFECTED AREA D  0  . desonide (DESOWEN) 0.05 % ointment APPLY TO AFFECTED AREA AT BEDTIME. 60 g 5  . DUPIXENT 300 MG/2ML SOSY     . EPINEPHrine 0.3 mg/0.3 mL IJ SOAJ injection Inject 0.3 mg into the muscle once.    . fluticasone (FLONASE) 50 MCG/ACT nasal spray Place 1 spray into both nostrils daily.    Marland Kitchen omeprazole (PRILOSEC) 40 MG capsule Take 1 capsule (40 mg total) by mouth daily. 30 capsule 1  . Testosterone Cypionate 200 MG/ML SOLN      No facility-administered medications prior to visit.         Objective:   Physical Exam Vitals:   09/16/18 1057  BP: 122/70  Pulse: 80  Temp: 98 F (36.7 C)  TempSrc: Oral  SpO2: 96%  Weight: 174 lb (78.9 kg)  Height: 5\' 5"  (1.651 m)   Gen: Pleasant, well-nourished, in no distress,  normal affect  ENT: No lesions,  mouth clear,  oropharynx clear, no postnasal drip  Neck: No JVD, no stridor  Lungs: No use of accessory muscles, no crackles or wheezing on normal respiration, no wheeze on forced expiration  Cardiovascular: RRR, heart sounds normal, no murmur or gallops, no peripheral edema  Musculoskeletal: No deformities, no cyanosis or clubbing  Neuro: alert, awake, non focal  Skin: Warm, no lesions or rash      Assessment & Plan:  Asthma Unclear dx. He denied childhood asthma today, but note that he has hx  per notes. Need PFT to assess for any degree obstruction as a contributor to his daily sx.   Chronic cough He describes symptoms that sound upper airway in nature.  Treating him more aggressively for GERD has helped some but has not resolved completely.  Complicating issue will be that he is not a great candidate for PPI given his bone density.  He denies overt allergy symptoms (on Dupixent for eczema), may have some throat mucus occasionally.  I think we should try to treat him for all potential upper airway irritants including empirically for allergies.  He needs pulmonary function testing to assess for true bronchospasm.  I will not start inhaled therapy right now, want to avoid anything that would irritate his upper airway.  Please continue omeprazole 40 mg once daily for now.  Take this 1 hour around food Please start Pepcid 20 mg twice a day until next visit. Please start loratadine 10 mg once daily until next visit. Try to avoid throat clearing.  It may be helpful to use sugar-free candy, non-mentholated cough drops.  When you have the desire to clear your throat, to swallow. Try to practice voice rest if possible We will perform full pulmonary function testing at your next visit Continue Dupixent as you have been taking it Follow with Dr Lamonte Sakai in 1 month   Baltazar Apo, MD, PhD 09/16/2018, 11:31 AM [facility] [phone] or if no answer [phone]

## 2018-10-17 ENCOUNTER — Ambulatory Visit: Payer: 59 | Admitting: Emergency Medicine

## 2018-10-17 ENCOUNTER — Telehealth: Payer: Self-pay | Admitting: Emergency Medicine

## 2018-10-17 ENCOUNTER — Encounter: Payer: Self-pay | Admitting: Emergency Medicine

## 2018-10-17 ENCOUNTER — Other Ambulatory Visit: Payer: Self-pay

## 2018-10-17 DIAGNOSIS — R05 Cough: Secondary | ICD-10-CM

## 2018-10-17 DIAGNOSIS — R053 Chronic cough: Secondary | ICD-10-CM

## 2018-10-17 NOTE — Telephone Encounter (Signed)
Per RB >> pt needs a bronch, no fluro, no TB risk, DX: cough. Needs to be scheduled at [facility] on 8/3 or 8/4 at 0730.  LMTCB x1 for Baxter Flattery in respiratory.

## 2018-10-17 NOTE — Assessment & Plan Note (Signed)
Continues to have cough, for the most part unchanged.  A lot of throat clearing and still has a globus sensation.  He does not feel overt GERD, currently on omeprazole and Pepcid.  He started loratadine as well, minimizes any component of allergic rhinitis.  He never had his pulmonary function testing I think we need to definitively rule in or out asthma.  Also given no response to simple modification medications of potential contributors I think he needs bronchoscopy to visualize his posterior pharynx and airways.  He understands and agrees.  We will arrange

## 2018-10-17 NOTE — Progress Notes (Signed)
Subjective:    Patient ID: Gregory Hard., male    DOB: Dec 13, 1955, 63 y.o.   MRN: [id number]  HPI 63 year old never smoker with a history of remote treated testicular cancer, hypogonadism, eczema, allergic rhinitis, GERD and chronic cough.  Referred today for evaluation of his chronic cough.  He has been diagnosed with asthma, seen by Dr. Donneta Romberg in [place].  PFTs not available to me right now.  He has been on Advair before, does not appear to be on any bronchodilators at this time.  He was recently changed from Prevacid to Prilosec 40 mg in attempt to treat some breakthrough GERD that was felt to be a contributor.  Apparently has been on Dupixent since 2019 for his eczema. Seems to have helped his skin.   He reports today that he started having cough 3-4 yrs ago, was dry at first. Was tried on allergy meds, Advair. He denies significant dyspnea, may have heard wheeze a few times before. He has cough all day long, sometimes w yellow mucous. Happens in the am with his coffee. Doesn't wake him up at night, when supine. He believes that the change to omeprazole has helped the cough some. Still with some breakthrough   ROV 10/17/2018 --62 year old never smoker, history as above, who follows up for chronic persistent cough.  Carries a diagnosis of asthma as above, has been on Dupixent for eczema..  At his last visit we added Pepcid to omeprazole.  Started loratadine, not on nasal steroid.  He has not had PFT.  He returns today reporting that he continues to have cough, globus sensation. No real response to Tessalon. The cough does seem to relate to when he eats / drinks.    Review of Systems  Constitutional: Negative for fever and unexpected weight change.  HENT: Negative for congestion, dental problem, ear pain, nosebleeds, postnasal drip, rhinorrhea, sinus pressure, sneezing, sore throat and trouble swallowing.   Eyes: Negative for redness and itching.  Respiratory: Positive for cough. Negative  for chest tightness, shortness of breath and wheezing.   Cardiovascular: Negative for palpitations and leg swelling.  Gastrointestinal: Negative for nausea and vomiting.  Genitourinary: Negative for dysuria.  Musculoskeletal: Negative for joint swelling.  Skin: Negative for rash.  Neurological: Negative for headaches.  Hematological: Does not bruise/bleed easily.  Psychiatric/Behavioral: Negative for dysphoric mood. The patient is not nervous/anxious.    Past Medical History:  Diagnosis Date  . Allergy   . Asthma    in past  . Cellulitis 2001    LLE ; [facility]  . Cellulitis 2007   R elbow ; S/ P drainage by Dr Telford Nab  . Cellulitis 2014   with sepsis  . Cellulitis 08/09/2014   LLE  . Eczema   . Edema   . Recurrent cellulitis of lower leg 08/15/2014  . Septic thrombophlebitis 08/15/2014  . Testicular cancer [facility]) 2004   Dr Jeffie Pollock     Family History  Problem Relation Age of Onset  . Heart disease Father        dysrrhymthmia  . Colon cancer Father   . Liver cancer Father   . Transient ischemic attack Mother        in 55s  . Asthma Sister   . Cancer Paternal Grandfather        stomach  . Eczema Maternal Uncle   . Diabetes Neg Hx   . Colon polyps Neg Hx   . Esophageal cancer Neg Hx   . Rectal  cancer Neg Hx   . Stomach cancer Neg Hx      Social History   Socioeconomic History  . Marital status: Married    Spouse name: Not on file  . Number of children: Not on file  . Years of education: Not on file  . Highest education level: Not on file  Occupational History  . Not on file  Social Needs  . Financial resource strain: Not on file  . Food insecurity    Worry: Not on file    Inability: Not on file  . Transportation needs    Medical: Not on file    Non-medical: Not on file  Tobacco Use  . Smoking status: Never Smoker  . Smokeless tobacco: Never Used  Substance and Sexual Activity  . Alcohol use: Yes    Alcohol/week: 12.0 standard drinks     Types: 12 Cans of beer per week    Comment: week  . Drug use: No  . Sexual activity: Yes  Lifestyle  . Physical activity    Days per week: Not on file    Minutes per session: Not on file  . Stress: Not on file  Relationships  . Social Herbalist on phone: Not on file    Gets together: Not on file    Attends religious service: Not on file    Active member of club or organization: Not on file    Attends meetings of clubs or organizations: Not on file    Relationship status: Not on file  . Intimate partner violence    Fear of current or ex partner: Not on file    Emotionally abused: Not on file    Physically abused: Not on file    Forced sexual activity: Not on file  Other Topics Concern  . Not on file  Social History Narrative  . Not on file   Works in Press photographer  Has lived in [place], [place]  No Known Allergies   Outpatient Medications Prior to Visit  Medication Sig Dispense Refill  . augmented betamethasone dipropionate (DIPROLENE-AF) 0.05 % ointment APPLY TO AFFECTED AREA AT BEDTIME 45 g 0  . benzonatate (TESSALON) 200 MG capsule Take 1 capsule (200 mg total) by mouth 3 (three) times daily as needed for cough. 90 capsule 2  . betamethasone dipropionate (DIPROLENE) 0.05 % ointment APPLY TO THE AFECTED AREA D  0  . desonide (DESOWEN) 0.05 % ointment APPLY TO AFFECTED AREA AT BEDTIME. 60 g 5  . DUPIXENT 300 MG/2ML SOSY     . EPINEPHrine 0.3 mg/0.3 mL IJ SOAJ injection Inject 0.3 mg into the muscle once.    . famotidine (PEPCID) 20 MG tablet Take 1 tablet (20 mg total) by mouth 2 (two) times daily. 60 tablet 5  . fluticasone (FLONASE) 50 MCG/ACT nasal spray Place 1 spray into both nostrils daily.    Marland Kitchen omeprazole (PRILOSEC) 40 MG capsule Take 1 capsule (40 mg total) by mouth daily. 30 capsule 1  . Testosterone Cypionate 200 MG/ML SOLN      No facility-administered medications prior to visit.         Objective:   Physical Exam Vitals:   10/17/18 1407  BP: 120/76  Pulse:  81  SpO2: 96%  Weight: 171 lb (77.6 kg)  Height: 5\' 5"  (1.651 m)   Gen: Pleasant, well-nourished, in no distress,  normal affect  ENT: No lesions,  mouth clear,  oropharynx clear, no postnasal drip  Neck: No JVD, no  stridor  Lungs: No use of accessory muscles, no crackles or wheezing on normal respiration, no wheeze on forced expiration  Cardiovascular: RRR, heart sounds normal, no murmur or gallops, no peripheral edema  Musculoskeletal: No deformities, no cyanosis or clubbing  Neuro: alert, awake, non focal  Skin: Warm, no lesions or rash      Assessment & Plan:  Chronic cough Continues to have cough, for the most part unchanged.  A lot of throat clearing and still has a globus sensation.  He does not feel overt GERD, currently on omeprazole and Pepcid.  He started loratadine as well, minimizes any component of allergic rhinitis.  He never had his pulmonary function testing I think we need to definitively rule in or out asthma.  Also given no response to simple modification medications of potential contributors I think he needs bronchoscopy to visualize his posterior pharynx and airways.  He understands and agrees.  We will arrange   Baltazar Apo, MD, PhD 10/17/2018, 2:38 PM [facility] [phone] or if no answer [phone]

## 2018-10-17 NOTE — Patient Instructions (Signed)
Please continue loratadine once daily as you have been taking it.  If you continue to have cough we might want to consider restarting your fluticasone nasal spray, 2 sprays once daily. Continue omeprazole and Pepcid as you have been taking them. Try to avoid throat clearing We will arrange for full pulmonary function testing We will arrange for bronchoscopy to inspect your vocal cords and airways. Follow with Dr Lamonte Sakai in 1 month

## 2018-10-17 NOTE — H&P (View-Only) (Signed)
Subjective:    Patient ID: Gregory Phillips., male    DOB: Aug 03, 1955, 63 y.o.   MRN: [id number]  HPI 63 year old never smoker with a history of remote treated testicular cancer, hypogonadism, eczema, allergic rhinitis, GERD and chronic cough.  Referred today for evaluation of his chronic cough.  He has been diagnosed with asthma, seen by Dr. Donneta Romberg in [place].  PFTs not available to me right now.  He has been on Advair before, does not appear to be on any bronchodilators at this time.  He was recently changed from Prevacid to Prilosec 40 mg in attempt to treat some breakthrough GERD that was felt to be a contributor.  Apparently has been on Dupixent since 2019 for his eczema. Seems to have helped his skin.   He reports today that he started having cough 3-4 yrs ago, was dry at first. Was tried on allergy meds, Advair. He denies significant dyspnea, may have heard wheeze a few times before. He has cough all day long, sometimes w yellow mucous. Happens in the am with his coffee. Doesn't wake him up at night, when supine. He believes that the change to omeprazole has helped the cough some. Still with some breakthrough   ROV 10/17/2018 --63 year old never smoker, history as above, who follows up for chronic persistent cough.  Carries a diagnosis of asthma as above, has been on Dupixent for eczema..  At his last visit we added Pepcid to omeprazole.  Started loratadine, not on nasal steroid.  He has not had PFT.  He returns today reporting that he continues to have cough, globus sensation. No real response to Tessalon. The cough does seem to relate to when he eats / drinks.    Review of Systems  Constitutional: Negative for fever and unexpected weight change.  HENT: Negative for congestion, dental problem, ear pain, nosebleeds, postnasal drip, rhinorrhea, sinus pressure, sneezing, sore throat and trouble swallowing.   Eyes: Negative for redness and itching.  Respiratory: Positive for cough. Negative  for chest tightness, shortness of breath and wheezing.   Cardiovascular: Negative for palpitations and leg swelling.  Gastrointestinal: Negative for nausea and vomiting.  Genitourinary: Negative for dysuria.  Musculoskeletal: Negative for joint swelling.  Skin: Negative for rash.  Neurological: Negative for headaches.  Hematological: Does not bruise/bleed easily.  Psychiatric/Behavioral: Negative for dysphoric mood. The patient is not nervous/anxious.    Past Medical History:  Diagnosis Date  . Allergy   . Asthma    in past  . Cellulitis 2001    LLE ; [facility]  . Cellulitis 2007   R elbow ; S/ P drainage by Dr Telford Nab  . Cellulitis 2014   with sepsis  . Cellulitis 08/09/2014   LLE  . Eczema   . Edema   . Recurrent cellulitis of lower leg 08/15/2014  . Septic thrombophlebitis 08/15/2014  . Testicular cancer [facility]) 2004   Dr Jeffie Pollock     Family History  Problem Relation Age of Onset  . Heart disease Father        dysrrhymthmia  . Colon cancer Father   . Liver cancer Father   . Transient ischemic attack Mother        in 14s  . Asthma Sister   . Cancer Paternal Grandfather        stomach  . Eczema Maternal Uncle   . Diabetes Neg Hx   . Colon polyps Neg Hx   . Esophageal cancer Neg Hx   . Rectal  cancer Neg Hx   . Stomach cancer Neg Hx      Social History   Socioeconomic History  . Marital status: Married    Spouse name: Not on file  . Number of children: Not on file  . Years of education: Not on file  . Highest education level: Not on file  Occupational History  . Not on file  Social Needs  . Financial resource strain: Not on file  . Food insecurity    Worry: Not on file    Inability: Not on file  . Transportation needs    Medical: Not on file    Non-medical: Not on file  Tobacco Use  . Smoking status: Never Smoker  . Smokeless tobacco: Never Used  Substance and Sexual Activity  . Alcohol use: Yes    Alcohol/week: 12.0 standard drinks     Types: 12 Cans of beer per week    Comment: week  . Drug use: No  . Sexual activity: Yes  Lifestyle  . Physical activity    Days per week: Not on file    Minutes per session: Not on file  . Stress: Not on file  Relationships  . Social Herbalist on phone: Not on file    Gets together: Not on file    Attends religious service: Not on file    Active member of club or organization: Not on file    Attends meetings of clubs or organizations: Not on file    Relationship status: Not on file  . Intimate partner violence    Fear of current or ex partner: Not on file    Emotionally abused: Not on file    Physically abused: Not on file    Forced sexual activity: Not on file  Other Topics Concern  . Not on file  Social History Narrative  . Not on file   Works in [employer]  Has lived in [place], [place]  No Known Allergies   Outpatient Medications Prior to Visit  Medication Sig Dispense Refill  . augmented betamethasone dipropionate (DIPROLENE-AF) 0.05 % ointment APPLY TO AFFECTED AREA AT BEDTIME 45 g 0  . benzonatate (TESSALON) 200 MG capsule Take 1 capsule (200 mg total) by mouth 3 (three) times daily as needed for cough. 90 capsule 2  . betamethasone dipropionate (DIPROLENE) 0.05 % ointment APPLY TO THE AFECTED AREA D  0  . desonide (DESOWEN) 0.05 % ointment APPLY TO AFFECTED AREA AT BEDTIME. 60 g 5  . DUPIXENT 300 MG/2ML SOSY     . EPINEPHrine 0.3 mg/0.3 mL IJ SOAJ injection Inject 0.3 mg into the muscle once.    . famotidine (PEPCID) 20 MG tablet Take 1 tablet (20 mg total) by mouth 2 (two) times daily. 60 tablet 5  . fluticasone (FLONASE) 50 MCG/ACT nasal spray Place 1 spray into both nostrils daily.    Marland Kitchen omeprazole (PRILOSEC) 40 MG capsule Take 1 capsule (40 mg total) by mouth daily. 30 capsule 1  . Testosterone Cypionate 200 MG/ML SOLN      No facility-administered medications prior to visit.         Objective:   Physical Exam Vitals:   10/17/18 1407  BP: 120/76  Pulse:  81  SpO2: 96%  Weight: 171 lb (77.6 kg)  Height: 5\' 5"  (1.651 m)   Gen: Pleasant, well-nourished, in no distress,  normal affect  ENT: No lesions,  mouth clear,  oropharynx clear, no postnasal drip  Neck: No JVD, no  stridor  Lungs: No use of accessory muscles, no crackles or wheezing on normal respiration, no wheeze on forced expiration  Cardiovascular: RRR, heart sounds normal, no murmur or gallops, no peripheral edema  Musculoskeletal: No deformities, no cyanosis or clubbing  Neuro: alert, awake, non focal  Skin: Warm, no lesions or rash      Assessment & Plan:  Chronic cough Continues to have cough, for the most part unchanged.  A lot of throat clearing and still has a globus sensation.  He does not feel overt GERD, currently on omeprazole and Pepcid.  He started loratadine as well, minimizes any component of allergic rhinitis.  He never had his pulmonary function testing I think we need to definitively rule in or out asthma.  Also given no response to simple modification medications of potential contributors I think he needs bronchoscopy to visualize his posterior pharynx and airways.  He understands and agrees.  We will arrange   Baltazar Apo, MD, PhD 10/17/2018, 2:38 PM [facility] [phone] or if no answer [phone]

## 2018-10-18 NOTE — Telephone Encounter (Signed)
Spoke with Baxter Flattery. Pt has been scheduled on 10/25/2018 at 0730 at [facility].  LMTCB x1 for pt.   RB has been made aware of this information.

## 2018-10-18 NOTE — Telephone Encounter (Signed)
Baxter Flattery from [facility] is returning phone call.  Baxter Flattery phone number is [phone].

## 2018-10-18 NOTE — Telephone Encounter (Signed)
Patient is returning phone call.  Patient phone number is [phone].

## 2018-10-18 NOTE — Telephone Encounter (Signed)
Spoke with patient. He stated that he received a MyChart message stating that the bronch has been scheduled for 8/4. Patient stated that this date would not work for him as he would be out of town that day.   Spoke to [facility] who had stated that they did not have any openings for 8/3.   RB, please advise of any other dates that we could get the patient scheduled. Thanks!

## 2018-10-19 NOTE — Telephone Encounter (Signed)
Returned pts call. Pt was inquiring if Dr. Lamonte Sakai had any other dates available. Informed pt we have not received dates back yet but once we do we will call him.  Will route this back to RB to see if there is other availability aside from 8/3 (no openings) and 8/4 (pt unavailable).

## 2018-10-19 NOTE — Telephone Encounter (Signed)
Pt returning call a/b scheduling procedure and can be reached @ [phone].Gregory Phillips

## 2018-10-24 ENCOUNTER — Other Ambulatory Visit: Payer: Self-pay | Admitting: *Deleted

## 2018-10-24 MED ORDER — FAMOTIDINE 20 MG PO TABS: 20.0000 mg | ORAL_TABLET | Freq: Two times a day (BID) | ORAL | 5 refills | Status: DC

## 2018-10-24 NOTE — Telephone Encounter (Signed)
Returned call to patient.  Patient states he thought he had been cancelled for bronch tomorrow 10/25/18 but he had received a call today reminding him of the appointment.  He was unavailable for bronch tomorrow and requested a change in dates.  He has not heard anything back and is calling today to see if Dr. Lamonte Sakai has another date in which he can be scheduled.     Will route to Dr. Lamonte Sakai and Desmond Dike for follow up.

## 2018-10-24 NOTE — Telephone Encounter (Signed)
Pt calling to R/S bronch the one for 8/4 has been CX.Gregory Phillips

## 2018-10-25 ENCOUNTER — Ambulatory Visit (HOSPITAL_COMMUNITY): Admission: RE | Admit: 2018-10-25 | Payer: 59 | Source: Ambulatory Visit

## 2018-10-25 ENCOUNTER — Encounter (HOSPITAL_COMMUNITY): Admission: RE | Payer: Self-pay | Source: Home / Self Care

## 2018-10-25 ENCOUNTER — Ambulatory Visit (HOSPITAL_COMMUNITY): Admission: RE | Admit: 2018-10-25 | Payer: 59 | Source: Home / Self Care | Admitting: Emergency Medicine

## 2018-10-25 SURGERY — VIDEO BRONCHOSCOPY WITHOUT FLUORO
Anesthesia: Moderate Sedation | Laterality: Bilateral

## 2018-10-25 NOTE — Telephone Encounter (Signed)
I will schedule with LL

## 2018-10-26 NOTE — Telephone Encounter (Signed)
Per RB >> dates available 8/10 ([facility]), 8/13 (Afternoon at [facility]), 8/14 ([facility]).  Spoke with pt. He would prefer to have the bronch done on 8/13. Called respiratory and was told that Baxter Flattery was off today and they could not schedule anything at this time. I will contact Baxter Flattery tomorrow.

## 2018-10-28 ENCOUNTER — Other Ambulatory Visit (HOSPITAL_COMMUNITY): Payer: Self-pay

## 2018-10-28 DIAGNOSIS — R053 Chronic cough: Secondary | ICD-10-CM

## 2018-10-28 DIAGNOSIS — R05 Cough: Secondary | ICD-10-CM

## 2018-10-28 NOTE — Telephone Encounter (Signed)
Spoke with [name] in respiratory. Gregory Phillips has been scheduled for his bronch on 11/03/2018 at 1400 at [facility].  Spoke with Gregory Phillips. He is aware of this information.  Sent a message to RB to make him aware as well.

## 2018-10-31 ENCOUNTER — Encounter: Payer: Self-pay | Admitting: Family Medicine

## 2018-10-31 ENCOUNTER — Other Ambulatory Visit (HOSPITAL_COMMUNITY): Payer: Self-pay

## 2018-10-31 DIAGNOSIS — R053 Chronic cough: Secondary | ICD-10-CM

## 2018-10-31 DIAGNOSIS — R05 Cough: Secondary | ICD-10-CM

## 2018-11-02 ENCOUNTER — Telehealth: Payer: Self-pay | Admitting: Emergency Medicine

## 2018-11-02 ENCOUNTER — Other Ambulatory Visit: Payer: Self-pay | Admitting: Emergency Medicine

## 2018-11-02 ENCOUNTER — Other Ambulatory Visit: Payer: Self-pay | Admitting: Family Medicine

## 2018-11-02 ENCOUNTER — Other Ambulatory Visit (HOSPITAL_COMMUNITY)
Admission: RE | Admit: 2018-11-02 | Discharge: 2018-11-02 | Disposition: A | Discharge status: Home / Self Care | Payer: 59 | Source: Ambulatory Visit | Attending: Emergency Medicine | Admitting: Emergency Medicine

## 2018-11-02 ENCOUNTER — Other Ambulatory Visit (HOSPITAL_COMMUNITY): Payer: Self-pay

## 2018-11-02 DIAGNOSIS — Z01812 Encounter for preprocedural laboratory examination: Secondary | ICD-10-CM | POA: Insufficient documentation

## 2018-11-02 DIAGNOSIS — Z20828 Contact with and (suspected) exposure to other viral communicable diseases: Secondary | ICD-10-CM | POA: Diagnosis not present

## 2018-11-02 DIAGNOSIS — R053 Chronic cough: Secondary | ICD-10-CM

## 2018-11-02 DIAGNOSIS — R05 Cough: Secondary | ICD-10-CM

## 2018-11-02 LAB — SARS CORONAVIRUS 2 (TAT 6-24 HRS): SARS Coronavirus 2: NEGATIVE

## 2018-11-03 ENCOUNTER — Ambulatory Visit (HOSPITAL_COMMUNITY)
Admission: RE | Admit: 2018-11-03 | Discharge: 2018-11-03 | Disposition: A | Discharge status: Home / Self Care | Payer: 59 | Source: Ambulatory Visit | Admitting: Emergency Medicine

## 2018-11-03 ENCOUNTER — Ambulatory Visit (HOSPITAL_COMMUNITY)
Admission: RE | Admit: 2018-11-03 | Discharge: 2018-11-03 | Disposition: A | Discharge status: Home / Self Care | Payer: 59 | Attending: Emergency Medicine | Admitting: Emergency Medicine

## 2018-11-03 ENCOUNTER — Encounter (HOSPITAL_COMMUNITY): Payer: Self-pay | Admitting: Respiratory Therapy

## 2018-11-03 ENCOUNTER — Encounter (HOSPITAL_COMMUNITY)
Admission: RE | Disposition: A | Discharge status: Home / Self Care | Payer: Self-pay | Source: Home / Self Care | Attending: Emergency Medicine

## 2018-11-03 ENCOUNTER — Encounter (HOSPITAL_COMMUNITY): Payer: 59

## 2018-11-03 ENCOUNTER — Inpatient Hospital Stay (HOSPITAL_COMMUNITY): Admission: RE | Admit: 2018-11-03 | Payer: 59 | Source: Ambulatory Visit

## 2018-11-03 DIAGNOSIS — R05 Cough: Secondary | ICD-10-CM | POA: Diagnosis not present

## 2018-11-03 DIAGNOSIS — K219 Gastro-esophageal reflux disease without esophagitis: Secondary | ICD-10-CM | POA: Diagnosis not present

## 2018-11-03 DIAGNOSIS — J309 Allergic rhinitis, unspecified: Secondary | ICD-10-CM | POA: Diagnosis not present

## 2018-11-03 DIAGNOSIS — E291 Testicular hypofunction: Secondary | ICD-10-CM | POA: Insufficient documentation

## 2018-11-03 DIAGNOSIS — Z79899 Other long term (current) drug therapy: Secondary | ICD-10-CM | POA: Insufficient documentation

## 2018-11-03 DIAGNOSIS — Z8547 Personal history of malignant neoplasm of testis: Secondary | ICD-10-CM | POA: Diagnosis not present

## 2018-11-03 DIAGNOSIS — R053 Chronic cough: Secondary | ICD-10-CM

## 2018-11-03 HISTORY — PX: VIDEO BRONCHOSCOPY: SHX5072

## 2018-11-03 LAB — BODY FLUID CELL COUNT WITH DIFFERENTIAL
Eos, Fluid: 2 %
Lymphs, Fluid: 26 %
Monocyte-Macrophage-Serous Fluid: 41 % — ABNORMAL LOW (ref 50–90)
Neutrophil Count, Fluid: 31 % — ABNORMAL HIGH (ref 0–25)
Total Nucleated Cell Count, Fluid: 37 cu mm (ref 0–1000)

## 2018-11-03 SURGERY — VIDEO BRONCHOSCOPY WITHOUT FLUORO
Anesthesia: Moderate Sedation

## 2018-11-03 MED ORDER — MIDAZOLAM HCL (PF) 10 MG/2ML IJ SOLN
INTRAMUSCULAR | Status: DC | PRN
  Administered 2018-11-03 (×2): 2 mg via INTRAVENOUS
  Administered 2018-11-03: 1 mg via INTRAVENOUS

## 2018-11-03 MED ORDER — SODIUM CHLORIDE 0.9 % IV SOLN
INTRAVENOUS | Status: DC
  Administered 2018-11-03: 15:00:00 via INTRAVENOUS

## 2018-11-03 MED ORDER — MIDAZOLAM HCL (PF) 5 MG/ML IJ SOLN
INTRAMUSCULAR | Status: AC
  Filled 2018-11-03: qty 2

## 2018-11-03 MED ORDER — LIDOCAINE HCL 1 % IJ SOLN
INTRAMUSCULAR | Status: DC | PRN
  Administered 2018-11-03: 6 mL via RESPIRATORY_TRACT

## 2018-11-03 MED ORDER — FENTANYL CITRATE (PF) 100 MCG/2ML IJ SOLN
INTRAMUSCULAR | Status: AC
  Filled 2018-11-03: qty 4

## 2018-11-03 MED ORDER — LIDOCAINE HCL URETHRAL/MUCOSAL 2 % EX GEL
CUTANEOUS | Status: DC | PRN
  Administered 2018-11-03: 1

## 2018-11-03 MED ORDER — LIDOCAINE HCL 2 % EX GEL
1.0000 "application " | Freq: Once | CUTANEOUS | Status: DC
  Filled 2018-11-03: qty 4250

## 2018-11-03 MED ORDER — PHENYLEPHRINE HCL 0.25 % NA SOLN
NASAL | Status: DC | PRN
  Administered 2018-11-03: 2 via NASAL

## 2018-11-03 MED ORDER — PHENYLEPHRINE HCL 0.25 % NA SOLN: 1.0000 | Freq: Four times a day (QID) | NASAL | Status: DC | PRN

## 2018-11-03 MED ORDER — FENTANYL CITRATE (PF) 100 MCG/2ML IJ SOLN
INTRAMUSCULAR | Status: DC | PRN
  Administered 2018-11-03 (×2): 25 ug via INTRAVENOUS
  Administered 2018-11-03: 50 ug via INTRAVENOUS

## 2018-11-03 NOTE — Interval H&P Note (Signed)
PCCM interval note.  Patient presents today for further evaluation of his chronic cough.  He is on Pepcid, omeprazole, loratadine.  He states that his cough is slightly better than when I last saw him on 7/27. He is having difficulty with persistent osteoporosis, is being considered for Fosamax.  May not be a candidate for PPI. He has not yet had his pulmonary function testing  Presents today for bronchoscopy, airway inspection and BAL for culture data.  Procedure explained in detail, all questions answered.  Discussed risk and benefits and he elected to proceed.  No barriers identified.  Baltazar Apo, MD, PhD 11/03/2018, 2:11 PM [facility] [phone] or if no answer [phone]

## 2018-11-03 NOTE — Progress Notes (Signed)
Video Bronchoscopy done  Intervention Bronchial washing done 

## 2018-11-03 NOTE — Discharge Instructions (Signed)
Flexible Bronchoscopy, Care After This sheet gives you information about how to care for yourself after your test. Your doctor may also give you more specific instructions. If you have problems or questions, contact your doctor. Follow these instructions at home: Eating and drinking  Do not eat or drink anything (not even water) for 2 hours after your test, or until your numbing medicine (local anesthetic) wears off.  When your numbness is gone and your cough and gag reflexes have come back, you may: ? Eat only soft foods. ? Slowly drink liquids.  The day after the test, go back to your normal diet. Driving  Do not drive for 24 hours if you were given a medicine to help you relax (sedative).  Do not drive or use heavy machinery while taking prescription pain medicine. General instructions   Take over-the-counter and prescription medicines only as told by your doctor.  Return to your normal activities as told. Ask what activities are safe for you.  Do not use any products that have nicotine or tobacco in them. This includes cigarettes and e-cigarettes. If you need help quitting, ask your doctor.  Keep all follow-up visits as told by your doctor. This is important. It is very important if you had a tissue sample (biopsy) taken. Get help right away if:  You have shortness of breath that gets worse.  You get light-headed.  You feel like you are going to pass out (faint).  You have chest pain.  You cough up: ? More than a little blood. ? More blood than before. Summary  Do not eat or drink anything (not even water) for 2 hours after your test, or until your numbing medicine wears off.  Do not use cigarettes. Do not use e-cigarettes.  Get help right away if you have chest pain. This information is not intended to replace advice given to you by your health care provider. Make sure you discuss any questions you have with your health care provider. Document Released: 01/04/2009  Document Revised: 02/19/2017 Document Reviewed: 03/27/2016 Elsevier Patient Education  2020 [employer].  Nothing to eat or drink until    5:30 pm today 11/03/2018   Any Questions or concerns   Please call the office at [phone]

## 2018-11-03 NOTE — Op Note (Signed)
[facility] Cardiop[facility] Patient Name: Gregory Phillips Date: 11/03/2018 MRN: [id number] Attending MD: Collene Gobble , MD Date of Birth: 1955/11/18 CSN: Finalized Age: 63 Admit Type: Outpatient Gender: Male Procedure:            Bronchoscopy Indications:          Chronic cough with normal chest X-ray Providers:            Collene Gobble, MD, Christin Fudge, Andre Lefort RRT,RCP Referring MD:          Medicines:            Midazolam 5 mg IV, Fentanyl 384 mcg IV Complications:        No immediate complications Estimated Blood Loss: Estimated blood loss: none. Procedure:            Pre-Anesthesia Assessment:                       - A History and Physical has been performed. Patient                        meds and allergies have been reviewed. The risks and                        benefits of the procedure and the sedation options and                        risks were discussed with the patient. All questions                        were answered and informed consent was obtained.                        Patient identification and proposed procedure were                        verified prior to the procedure by the physician in the                        procedure room. Mental Status Examination: normal.                        Airway Examination: normal oropharyngeal airway.                        Respiratory Examination: clear to auscultation. CV                        Examination: RRR, no murmurs, no S3 or S4. ASA Grade                        Assessment: I - A normal healthy patient. After                        reviewing the risks and benefits, the patient was                        deemed in satisfactory condition to undergo the  procedure. The anesthesia plan was to use moderate                        sedation / analgesia (conscious sedation). Immediately                        prior to administration of  medications, the patient was                        re-assessed for adequacy to receive sedatives. The                        heart rate, respiratory rate, oxygen saturations, blood                        pressure, adequacy of pulmonary ventilation, and                        response to care were monitored throughout the                        procedure. The physical status of the patient was                        re-assessed after the procedure.                       After obtaining informed consent, the bronchoscope was                        passed under direct vision. Throughout the procedure,                        the patient's blood pressure, pulse, and oxygen                        saturations were monitored continuously. the BF-H190                        ([id number]) Olympus Diagnostic Bronchoscope was                        introduced through the right nostril and advanced to                        the tracheobronchial tree. The procedure was                        accomplished without difficulty. The patient tolerated                        the procedure well. The total duration of the procedure                        was 24 minutes. Scope In: 2:17:33 PM Scope Out: 2:32:18 PM Findings:      The nasopharynx/oropharynx appears normal. The vocal cords appear       normal. The subglottic space is normal. The trachea is of normal       caliber. The carina is sharp. The tracheobronchial tree was examined to  at least the first subsegmental level. Bronchial mucosa and anatomy are       normal; there are no endobronchial lesions, and no secretions.      Larynx: Hyperplastic changes were found in the larynx. The changes are       not obstructing the airway.      Bronchoalveolar lavage was performed in the RUL apical segment (B1) of       the lung and sent for cell count, bacterial culture, viral smears &       culture, and fungal & AFB analysis and cytology. 60 mL of fluid were        instilled. 20 mL were returned. The return was clear. There were no       mucoid plugs in the return fluid. Impression:           - Chronic cough with normal chest X-ray                       - The airway examination was normal.                       - Hyperplastic changes were seen in the larynx.                       - Bronchoalveolar lavage was performed. Moderate Sedation:      Moderate (conscious) sedation was personally administered by the       endoscopist. The following parameters were monitored: oxygen saturation,       heart rate, blood pressure, respiratory rate, EKG, adequacy of pulmonary       ventilation, and response to care. Total physician intraservice time was       24 minutes. Recommendation:       - Await BAL results. Procedure Code(s):    --- Professional ---                       [phone], Bronchoscopy, rigid or flexible, including                        fluoroscopic guidance, when performed; with bronchial                        alveolar lavage                       99152, Moderate sedation services provided by the same                        physician or other qualified health care professional                        performing the diagnostic or therapeutic service that                        the sedation supports, requiring the presence of an                        independent trained observer to assist in the                        monitoring of the patient's level of consciousness and  physiological status; initial 15 minutes of                        intraservice time, patient age 15 years or older                       [phone], Moderate sedation; each additional 15 minutes                        intraservice time Diagnosis Code(s):    --- Professional ---                       R05, Cough                       J38.7, Other diseases of larynx CPT copyright 2019 American Medical Association. All rights reserved. The codes documented in this  report are preliminary and upon coder review may  be revised to meet current compliance requirements. Collene Gobble, MD Collene Gobble, MD 11/03/2018 3:06:41 PM Number of Addenda: 0

## 2018-11-03 NOTE — Telephone Encounter (Signed)
Seems like encounter was open in error so closing encounter.  

## 2018-11-04 ENCOUNTER — Encounter (HOSPITAL_COMMUNITY): Payer: Self-pay | Admitting: Emergency Medicine

## 2018-11-04 LAB — ACID FAST SMEAR (AFB, MYCOBACTERIA): Acid Fast Smear: NEGATIVE

## 2018-11-06 LAB — CULTURE, BAL-QUANTITATIVE W GRAM STAIN
Culture: 5000 — AB
Special Requests: NORMAL

## 2018-11-09 ENCOUNTER — Encounter: Payer: Self-pay | Admitting: Family Medicine

## 2018-11-09 ENCOUNTER — Ambulatory Visit (INDEPENDENT_AMBULATORY_CARE_PROVIDER_SITE_OTHER): Payer: 59 | Admitting: Family Medicine

## 2018-11-09 ENCOUNTER — Other Ambulatory Visit (INDEPENDENT_AMBULATORY_CARE_PROVIDER_SITE_OTHER): Payer: 59

## 2018-11-09 ENCOUNTER — Other Ambulatory Visit: Payer: Self-pay

## 2018-11-09 VITALS — Ht 65.0 in | Wt 170.0 lb

## 2018-11-09 DIAGNOSIS — E782 Mixed hyperlipidemia: Secondary | ICD-10-CM | POA: Diagnosis not present

## 2018-11-09 DIAGNOSIS — T7840XA Allergy, unspecified, initial encounter: Secondary | ICD-10-CM | POA: Insufficient documentation

## 2018-11-09 DIAGNOSIS — R05 Cough: Secondary | ICD-10-CM | POA: Diagnosis not present

## 2018-11-09 DIAGNOSIS — M816 Localized osteoporosis [Lequesne]: Secondary | ICD-10-CM

## 2018-11-09 DIAGNOSIS — M109 Gout, unspecified: Secondary | ICD-10-CM | POA: Diagnosis not present

## 2018-11-09 DIAGNOSIS — R053 Chronic cough: Secondary | ICD-10-CM

## 2018-11-09 DIAGNOSIS — E291 Testicular hypofunction: Secondary | ICD-10-CM

## 2018-11-09 MED ORDER — EPINEPHRINE 0.3 MG/0.3ML IJ SOAJ: 0.3000 mg | Freq: Once | INTRAMUSCULAR | 1 refills | Status: AC

## 2018-11-09 NOTE — Progress Notes (Signed)
Phone [phone]   Subjective:  Virtual visit via Video note. Chief complaint: Chief Complaint  Patient presents with  . Acute Visit  . Cough  . Osteoporosis   This visit type was conducted due to national recommendations for restrictions regarding the COVID-19 Pandemic (e.g. social distancing).  This format is felt to be most appropriate for this patient at this time balancing risks to patient and risks to population by having him in for in person visit.  No physical exam was performed (except for noted visual exam or audio findings with Telehealth visits).   Our team/I connected with Gregory Phillips. at 11:20 AM EDT by a video enabled telemedicine application (doxy.me or caregility through epic) and verified that I am speaking with the correct person using two identifiers.  Location patient: [place] Location provider: [facility], office Persons participating in the virtual visit:  patient  Our team/I discussed the limitations of evaluation and management by telemedicine and the availability of in person appointments. In light of current covid-19 pandemic, patient also understands that we are trying to protect them by minimizing in office contact if at all possible.  The patient expressed consent for telemedicine visit and agreed to proceed. Patient understands insurance will be billed.   ROS-chronic cough-mild improvement.  No fever/chills/nausea/vomiting.  Past Medical History-  Patient Active Problem List   Diagnosis Date Noted  . Hypogonadism male 11/09/2018    Priority: High  . Osteoporosis 06/29/2017    Priority: High  . Recurrent cellulitis of lower leg 08/15/2014    Priority: High  . Atopic eczema 03/21/2007    Priority: High  . Chronic cough 09/16/2018    Priority: Medium  . Lymphedema     Priority: Medium  . Other abnormal glucose 08/13/2013    Priority: Medium  . Asthma 03/28/2009    Priority: Medium  . Seminoma of descended right testis ([place]) 03/21/2007     Priority: Medium  . HYPERLIPIDEMIA 03/21/2007    Priority: Medium  . Metatarsal stress fracture of right foot 08/05/2017    Priority: Low  . Erectile dysfunction 12/09/2015    Priority: Low  . GERD (gastroesophageal reflux disease) 12/09/2015    Priority: Low  . Hair loss 08/23/2014    Priority: Low  . Septic thrombophlebitis 08/15/2014    Priority: Low  . History of adenomatous polyp of colon 03/28/2009    Priority: Low  . Eczema 06/29/2012    Medications- reviewed and updated Current Outpatient Medications  Medication Sig Dispense Refill  . augmented betamethasone dipropionate (DIPROLENE-AF) 0.05 % ointment APPLY TO AFFECTED AREA AT BEDTIME 45 g 0  . betamethasone dipropionate (DIPROLENE) 0.05 % ointment APPLY TO THE AFECTED AREA D  0  . desonide (DESOWEN) 0.05 % ointment APPLY TO AFFECTED AREA AT BEDTIME. 60 g 5  . DUPIXENT 300 MG/2ML SOSY     . EPINEPHrine 0.3 mg/0.3 mL IJ SOAJ injection Inject 0.3 mLs (0.3 mg total) into the muscle once for 1 dose. 2 each 1  . famotidine (PEPCID) 20 MG tablet Take 1 tablet (20 mg total) by mouth 2 (two) times daily. 60 tablet 5  . fluticasone (FLONASE) 50 MCG/ACT nasal spray Place 1 spray into both nostrils daily.    Marland Kitchen loratadine (CLARITIN) 10 MG tablet Take 10 mg by mouth daily.    . Testosterone Cypionate 200 MG/ML SOLN     . alendronate (FOSAMAX) 70 MG tablet Take by mouth.    . benzonatate (TESSALON) 200 MG capsule Take 1 capsule (  200 mg total) by mouth 3 (three) times daily as needed for cough. (Patient not taking: Reported on 11/09/2018) 90 capsule 2   No current facility-administered medications for this visit.      Objective:  Ht 5\' 5"  (1.651 m)   Wt 170 lb (77.1 kg)   BMI 28.29 kg/m  self reported vitals Gen: NAD, resting comfortably Lungs: nonlabored, normal respiratory rate  Skin: appears dry, no obvious rash    Assessment and Plan   # Chronic Cough S: for chronic cough is on omeprazole. Dr. Lamonte Sakai added famotidine  and claritin. Thursday of last week had bronchoschopy with Dr. Lamonte Sakai- no findings on that. Still concern for Allergies vs. Asthma- breathing test next visit. He was advised to stop omeprazole given worsening osteoporosis- stopped on Monday.  A/P: for chronic cough continue close follow up with Dr. Lamonte Sakai. Some interplay here- hopeful fosamax does not worsen his reflux and in turn worsen cough.  Also doing vitamin D3 as well as K2 from [facility].  - could also be asthma- upcoming testing - continue aggressive allergy treatment  #Osteoporosis #Hypogonadism S: Had bone density done at [facility] and unfortunately came back worse compared to last year. 4% and 8% bone loss  Initial concern was secondary osteoporosis related to hypogonadism.  He is followed by [facility] endocrinology-Testosterone shots every 2 weeks. Levels in 500 range. Dr. Tamala Julian happy with that.   Next step was to start Fosamax-patient wanted my opinion on this A/P: Since osteoporosis is worsening despite adequate testosterone levels- I agree with Dr. Tamala Julian about starting Fosamax.  I did send patient a message after visit that this could worsen his reflux  #Gout S: Intermittent issues with gout on right knee-has been followed by Dr. Noemi Chapel of [facility].  He has colchicine/Mitigare for flareups A/P: Unclear baseline uric acid level-discussed having him by for labs and considering medication like allopurinol to reduce flareups-he is interested.  He will call to schedule a lab visit.  #hyperlipidemia S: Poorly controlled on no statin in the past Lab Results  Component Value Date   CHOL 224 (H) 03/03/2016   HDL 63.10 03/03/2016   LDLCALC 148 (H) 03/03/2016   LDLDIRECT 148.1 07/22/2012   TRIG 64.0 03/03/2016   CHOLHDL 4 03/03/2016   A/P: Has not had a recent physical-recommended he call to schedule this.  We will go ahead and get lipid panel when he stops for uric acid level.  If ten-year risk above 7.5% consider further  stratification, lifestyle modifications, or statin perhaps  Recommended follow up: Physical within  6 months  Lab/Order associations:   ICD-10-CM   1. HYPERLIPIDEMIA  E78.2 Lipid panel  2. Gout, unspecified cause, unspecified chronicity, unspecified site  M10.9 Uric acid  3. Localized osteoporosis without current pathological fracture  M81.6   4. Chronic cough  R05   5. Hypogonadism male  E29.1    Reports lip and throat swelling in the past with fish exposure-possibly not exposure.  Refill EpiPen today Meds ordered this encounter  Medications  . EPINEPHrine 0.3 mg/0.3 mL IJ SOAJ injection    Sig: Inject 0.3 mLs (0.3 mg total) into the muscle once for 1 dose.    Dispense:  2 each    Refill:  1   Return precautions advised.  Garret Reddish, MD

## 2018-11-09 NOTE — Patient Instructions (Signed)
Health Maintenance Due  Topic Date Due  . INFLUENZA VACCINE  10/22/2018    Depression screen [facility] 2/9 07/01/2017 07/21/2012  Decreased Interest 0 0  Down, Depressed, Hopeless 0 0  PHQ - 2 Score 0 0

## 2018-11-10 LAB — LIPID PANEL
Cholesterol: 199 mg/dL (ref 0–200)
HDL: 54.7 mg/dL (ref >39.00)
LDL Cholesterol: 122 mg/dL — ABNORMAL HIGH (ref 0–99)
NonHDL: 143.99
Total CHOL/HDL Ratio: 4
Triglycerides: 109 mg/dL (ref 0.0–149.0)
VLDL: 21.8 mg/dL (ref 0.0–40.0)

## 2018-11-10 LAB — URIC ACID: Uric Acid, Serum: 8.3 mg/dL — ABNORMAL HIGH (ref 4.0–7.8)

## 2018-11-14 ENCOUNTER — Other Ambulatory Visit: Payer: Self-pay

## 2018-11-14 DIAGNOSIS — M109 Gout, unspecified: Secondary | ICD-10-CM

## 2018-11-14 MED ORDER — ALLOPURINOL 100 MG PO TABS: 100.0000 mg | ORAL_TABLET | Freq: Every day | ORAL | 3 refills | Status: DC

## 2018-11-23 ENCOUNTER — Ambulatory Visit: Payer: Self-pay | Admitting: *Deleted

## 2018-11-23 NOTE — Telephone Encounter (Signed)
See note

## 2018-11-23 NOTE — Telephone Encounter (Signed)
He stated he was cleaning his car out and rolled over and hit a corner of an object in the car, like a punch, on Sunday. He stated that it had been sore but now hurts to take a deep breath. He denies being on a blood thinner, fever, bruising, bleeding or cough. Advised to go to the hospital. Notified [facility] at [place]. He does not want to go to the hospital, advised him that it seems like the area is not improving and maybe getting worse each day. He stated he will call back in the morning. Routing to the practice for review.  Reason for Disposition . [1] Can't take a deep breath BUT [2] no respiratory distress  Answer Assessment - Initial Assessment Questions 1. MECHANISM: "How did the injury happen?"    Cleaning the car and rolled unto a corner of an object in the car on his left side near the rib cage 2. ONSET: "When did the injury happen?" (Minutes or hours ago)     Sunday 3. LOCATION: "What part of the abdomen is injured?"     Left side near rib cage 4. APPEARANCE of INJURY: "What does the injury look like?"     No bruise 5. PAIN: "Is there any pain?" If so, ask: "How bad is the pain?"  (e.g., Scale 1-10; or mild, moderate, severe)  - MILD - doesn't interfere with normal activities   - MODERATE - interferes with normal activities or awakens from sleep   - SEVERE - patient doesn't want to move (R/O peritonitis, internal bleeding)      Hurts to take a deep breathe 6. SIZE: For cuts, bruises, or swelling, ask: "How large is it?" (e.g., inches or centimeters)     No swelling 7. TETANUS: For any breaks in the skin, ask: "When was the last tetanus booster?"     No breaks in the skin 8. OTHER SYMPTOMS: "Do you have any other symptoms?"     no 9. PREGNANCY: "Is there any chance you are pregnant?" "When was your last menstrual period?"     n/a  Protocols used: ABDOMINAL INJURY-A-AH

## 2018-11-23 NOTE — Telephone Encounter (Signed)
PT has been scheduled.

## 2018-11-24 ENCOUNTER — Ambulatory Visit: Payer: 59 | Admitting: Family Medicine

## 2018-11-24 ENCOUNTER — Ambulatory Visit (INDEPENDENT_AMBULATORY_CARE_PROVIDER_SITE_OTHER): Payer: 59

## 2018-11-24 ENCOUNTER — Encounter: Payer: Self-pay | Admitting: Family Medicine

## 2018-11-24 ENCOUNTER — Other Ambulatory Visit: Payer: Self-pay

## 2018-11-24 VITALS — BP 130/84 | HR 75 | Temp 98.2°F | Ht 65.0 in | Wt 170.0 lb

## 2018-11-24 DIAGNOSIS — M816 Localized osteoporosis [Lequesne]: Secondary | ICD-10-CM | POA: Diagnosis not present

## 2018-11-24 DIAGNOSIS — R053 Chronic cough: Secondary | ICD-10-CM

## 2018-11-24 DIAGNOSIS — M109 Gout, unspecified: Secondary | ICD-10-CM

## 2018-11-24 DIAGNOSIS — R0781 Pleurodynia: Secondary | ICD-10-CM | POA: Diagnosis not present

## 2018-11-24 DIAGNOSIS — R05 Cough: Secondary | ICD-10-CM

## 2018-11-24 DIAGNOSIS — K219 Gastro-esophageal reflux disease without esophagitis: Secondary | ICD-10-CM

## 2018-11-24 NOTE — Progress Notes (Signed)
Phone [phone]   Subjective:  Gregory Decleene. is a 63 y.o. year old very pleasant male patient who presents for/with See problem oriented charting Chief Complaint  Patient presents with  . LUQ pain   ROS- no fever/chills. No shortness o fbreath. Does have let lower rib painchest pain- no central chest pain    Past Medical History-  Patient Active Problem List   Diagnosis Date Noted  . Hypogonadism male 11/09/2018    Priority: High  . Osteoporosis 06/29/2017    Priority: High  . Recurrent cellulitis of lower leg 08/15/2014    Priority: High  . Atopic eczema 03/21/2007    Priority: High  . Allergic reaction 11/09/2018    Priority: Medium  . Chronic cough 09/16/2018    Priority: Medium  . Lymphedema     Priority: Medium  . Other abnormal glucose 08/13/2013    Priority: Medium  . Asthma 03/28/2009    Priority: Medium  . Seminoma of descended right testis ([place]) 03/21/2007    Priority: Medium  . HYPERLIPIDEMIA 03/21/2007    Priority: Medium  . Metatarsal stress fracture of right foot 08/05/2017    Priority: Low  . Erectile dysfunction 12/09/2015    Priority: Low  . GERD (gastroesophageal reflux disease) 12/09/2015    Priority: Low  . Hair loss 08/23/2014    Priority: Low  . Septic thrombophlebitis 08/15/2014    Priority: Low  . History of adenomatous polyp of colon 03/28/2009    Priority: Low  . Gout 11/24/2018  . Eczema 06/29/2012    Medications- reviewed and updated Current Outpatient Medications  Medication Sig Dispense Refill  . alendronate (FOSAMAX) 70 MG tablet Take by mouth.    Marland Kitchen allopurinol (ZYLOPRIM) 100 MG tablet Take 1 tablet (100 mg total) by mouth daily. 90 tablet 3  . betamethasone dipropionate (DIPROLENE) 0.05 % ointment APPLY TO THE AFECTED AREA D  0  . desonide (DESOWEN) 0.05 % ointment APPLY TO AFFECTED AREA AT BEDTIME. 60 g 5  . DUPIXENT 300 MG/2ML SOSY     . famotidine (PEPCID) 20 MG tablet Take 1 tablet (20 mg total) by mouth 2  (two) times daily. 60 tablet 5  . fluticasone (FLONASE) 50 MCG/ACT nasal spray Place 1 spray into both nostrils daily.    Marland Kitchen loratadine (CLARITIN) 10 MG tablet Take 10 mg by mouth daily.    . Testosterone Cypionate 200 MG/ML SOLN     . augmented betamethasone dipropionate (DIPROLENE-AF) 0.05 % ointment APPLY TO AFFECTED AREA AT BEDTIME 45 g 0   No current facility-administered medications for this visit.      Objective:  BP 130/84 (BP Location: Left Arm, Patient Position: Sitting, Cuff Size: Normal)   Pulse 75   Temp 98.2 F (36.8 C) (Temporal)   Ht 5\' 5"  (1.651 m)   Wt 170 lb (77.1 kg)   SpO2 97%   BMI 28.29 kg/m  Gen: NAD, resting comfortably CV: RRR no murmurs rubs or gallops Patient with pain over left lower ribs rather pinpoint over lateral portion Lungs: CTAB no crackles, wheeze, rhonchi Abdomen: soft/nontender even with deep palpation LUA/nondistended/normal bowel sounds. No rebound or guarding.  Ext: no edema Skin: warm, dry    Assessment and Plan    # LUQ Pain vs. Left sided rib pain #osteoporosis #GERD/chronic cough S:Hit L-side ribs on the corner of the seat on Sunday when in car cleaning things out (rolled over and hit car seat rather hard and felt like a punch immediately) .  Sx worse with deep breaths, palpation, twisting. No bruising or bleeding.   Symptoms worsened by Tuesday and then moderate by Wednesday leading him to call us. He took some motrin and calmed down some last night- slightly better today- has not had to take motrin yet today. Pain up to 6/10 today. Worse with certain movements- if gets rigid gets sharp feeling or if takes deep breath gets a stabbing pain. Cannot take full deep breaths but does not feel short of breath. No chest pain in central chest. No blood in urine.   Known osteoporosis/has had 3 doses so far of fosamax/no reflux so far- even with just taking pepcid (cough was better on ppi and pepcid) and no other worsening reflux symptoms  A/P: no pain on abdominal exam- pain is all over lower ribs. Will get rib flms today. My strongest suspicion is significant bruising over ribs but he may just have high pain threshold and actually has fracture  - patient with known osteoporosis and recently started fosamax- if fracture- consider pause for 8 weeks on fosamax (though likely would have him reach out to wake Dr. Tamala Julian before formally making this decision)  - thankful GERD has not worsened significantly on fosamax other than mild increase in cough off PPI (great to be off PPI given osteoporosis). Continue current meds for now.   # gout- no flares with starting allopurinol thankfully- continue current meds a sstable without flares, labs for uric acid in a few weeks- with goal 6 or less  Recommended follow up:  Prn for acute issue Future Appointments  Date Time Provider [place]  01/16/2019  2:00 PM [facility]    Lab/Order associations:   ICD-10-CM   1. Rib pain on left side  R07.81 DG Ribs Unilateral W/Chest Left  2. Gastroesophageal reflux disease without esophagitis  K21.9   3. Gout, unspecified cause, unspecified chronicity, unspecified site  M10.9   4. Localized osteoporosis without current pathological fracture  M81.6   5. Chronic cough  R05    Return precautions advised.  Garret Reddish, MD

## 2018-11-24 NOTE — Patient Instructions (Addendum)
Health Maintenance Due  Topic Date Due  . INFLUENZA VACCINE - declines 10/22/2018   Please stop by x-ray before you go we will send your results within 3 business days of [place] receiving them.  If abnormal or we want to clarify a result, we will call or mychart you to make sure you receive the message.  If you have questions or concerns or don't hear within 5-7 days, please send [place] a message or call us.

## 2018-12-06 LAB — FUNGUS CULTURE WITH STAIN

## 2018-12-06 LAB — FUNGUS CULTURE RESULT

## 2018-12-06 LAB — FUNGAL ORGANISM REFLEX

## 2018-12-17 LAB — ACID FAST CULTURE WITH REFLEXED SENSITIVITIES (MYCOBACTERIA): Acid Fast Culture: NEGATIVE

## 2018-12-19 ENCOUNTER — Telehealth: Payer: Self-pay | Admitting: Emergency Medicine

## 2018-12-20 NOTE — Telephone Encounter (Signed)
pft and f/u scheduled for 02/21/2019-pr

## 2019-01-16 ENCOUNTER — Other Ambulatory Visit: Payer: 59

## 2019-02-10 ENCOUNTER — Encounter: Payer: Self-pay | Admitting: Family Medicine

## 2019-02-10 DIAGNOSIS — L209 Atopic dermatitis, unspecified: Secondary | ICD-10-CM

## 2019-02-14 MED ORDER — BETAMETHASONE DIPROPIONATE AUG 0.05 % EX OINT: TOPICAL_OINTMENT | CUTANEOUS | 2 refills | Status: DC

## 2019-02-14 MED ORDER — DESONIDE 0.05 % EX OINT: TOPICAL_OINTMENT | CUTANEOUS | 2 refills | Status: DC

## 2019-02-21 ENCOUNTER — Ambulatory Visit: Payer: 59 | Admitting: Emergency Medicine

## 2019-04-07 ENCOUNTER — Other Ambulatory Visit (HOSPITAL_COMMUNITY)
Admission: RE | Admit: 2019-04-07 | Discharge: 2019-04-07 | Disposition: A | Discharge status: Home / Self Care | Payer: 59 | Source: Ambulatory Visit | Attending: Emergency Medicine | Admitting: Emergency Medicine

## 2019-04-07 DIAGNOSIS — Z20822 Contact with and (suspected) exposure to covid-19: Secondary | ICD-10-CM | POA: Diagnosis not present

## 2019-04-07 DIAGNOSIS — Z01812 Encounter for preprocedural laboratory examination: Secondary | ICD-10-CM | POA: Insufficient documentation

## 2019-04-08 LAB — NOVEL CORONAVIRUS, NAA (HOSP ORDER, SEND-OUT TO REF LAB; TAT 18-24 HRS): SARS-CoV-2, NAA: NOT DETECTED

## 2019-04-11 ENCOUNTER — Ambulatory Visit (INDEPENDENT_AMBULATORY_CARE_PROVIDER_SITE_OTHER): Payer: 59 | Admitting: Emergency Medicine

## 2019-04-11 ENCOUNTER — Other Ambulatory Visit: Payer: Self-pay

## 2019-04-11 DIAGNOSIS — R05 Cough: Secondary | ICD-10-CM

## 2019-04-11 DIAGNOSIS — R053 Chronic cough: Secondary | ICD-10-CM

## 2019-04-11 LAB — PULMONARY FUNCTION TEST
DL/VA % pred: 121 %
DL/VA: 5.22 ml/min/mmHg/L
DLCO unc % pred: 149 %
DLCO unc: 36.21 ml/min/mmHg
FEF 25-75 Post: 3.66 L/s
FEF 25-75 Pre: 3.4 L/s
FEF2575-%Change-Post: 7 %
FEF2575-%Pred-Post: 142 %
FEF2575-%Pred-Pre: 132 %
FEV1-%Change-Post: 3 %
FEV1-%Pred-Post: 121 %
FEV1-%Pred-Pre: 117 %
FEV1-Post: 3.74 L
FEV1-Pre: 3.62 L
FEV1FVC-%Change-Post: 1 %
FEV1FVC-%Pred-Pre: 103 %
FEV6-%Change-Post: 1 %
FEV6-%Pred-Post: 121 %
FEV6-%Pred-Pre: 119 %
FEV6-Post: 4.7 L
FEV6-Pre: 4.64 L
FEV6FVC-%Change-Post: 0 %
FEV6FVC-%Pred-Post: 104 %
FEV6FVC-%Pred-Pre: 104 %
FVC-%Change-Post: 1 %
FVC-%Pred-Post: 115 %
FVC-%Pred-Pre: 114 %
FVC-Post: 4.72 L
FVC-Pre: 4.66 L
Post FEV1/FVC ratio: 79 %
Post FEV6/FVC ratio: 100 %
Pre FEV1/FVC ratio: 78 %
Pre FEV6/FVC Ratio: 99 %
RV % pred: 142 %
RV: 2.91 L
TLC % pred: 132 %
TLC: 8.21 L

## 2019-04-11 NOTE — Progress Notes (Signed)
Full PFT performed today. °

## 2019-04-18 ENCOUNTER — Other Ambulatory Visit: Payer: Self-pay

## 2019-04-18 ENCOUNTER — Encounter: Payer: Self-pay | Admitting: Emergency Medicine

## 2019-04-18 ENCOUNTER — Ambulatory Visit: Payer: 59 | Admitting: Emergency Medicine

## 2019-04-18 DIAGNOSIS — K219 Gastro-esophageal reflux disease without esophagitis: Secondary | ICD-10-CM

## 2019-04-18 DIAGNOSIS — R05 Cough: Secondary | ICD-10-CM

## 2019-04-18 DIAGNOSIS — J4540 Moderate persistent asthma, uncomplicated: Secondary | ICD-10-CM

## 2019-04-18 DIAGNOSIS — R053 Chronic cough: Secondary | ICD-10-CM

## 2019-04-18 NOTE — Assessment & Plan Note (Signed)
With contributions from allergic rhinitis, GERD.  His GERD does still breakthrough at times.  I had him initially on a PPI plus Pepcid, now he is on Pepcid alone.  The cough is continued even on medication.  His bronchoscopy shows cobblestoning and posterior pharyngeal hyperplasia consistent with GERD.  He minimizes his rhinitis symptoms and I do not think there is any benefit to restarting an allergy regimen right now.

## 2019-04-18 NOTE — Progress Notes (Signed)
Subjective:    Patient ID: Gregory Hard., male    DOB: 05-Sep-1955, 64 y.o.   MRN: [id number]  HPI 64 year old never smoker with a history of remote treated testicular cancer, hypogonadism, eczema, allergic rhinitis, GERD and chronic cough.  Referred today for evaluation of his chronic cough.  He has been diagnosed with asthma, seen by Dr. Donneta Romberg in [place].  PFTs not available to me right now.  He has been on Advair before, does not appear to be on any bronchodilators at this time.  He was recently changed from Prevacid to Prilosec 40 mg in attempt to treat some breakthrough GERD that was felt to be a contributor.  Apparently has been on Dupixent since 2019 for his eczema. Seems to have helped his skin.   He reports today that he started having cough 3-4 yrs ago, was dry at first. Was tried on allergy meds, Advair. He denies significant dyspnea, may have heard wheeze a few times before. He has cough all day long, sometimes w yellow mucous. Happens in the am with his coffee. Doesn't wake him up at night, when supine. He believes that the change to omeprazole has helped the cough some. Still with some breakthrough   ROV 10/17/2018 --64 year old never smoker, history as above, who follows up for chronic persistent cough.  Carries a diagnosis of asthma as above, has been on Dupixent for eczema..  At his last visit we added Pepcid to omeprazole.  Started loratadine, not on nasal steroid.  He has not had PFT.  He returns today reporting that he continues to have cough, globus sensation. No real response to Tessalon. The cough does seem to relate to when he eats / drinks.   ROV 04/18/2019 --Gregory Phillips is 64, follows up today for history of chronic cough.  He also carries a diagnosis of asthma, is on Dupixent for eczema.  We have treated GERD, allergic rhinitis.  He underwent bronchoscopy 11/03/2018 that was overall normal without any significant lower airway findings.  He did have hyperplastic changes  in his larynx.  Culture data was all negative.   Review of Systems  Constitutional: Negative for fever and unexpected weight change.  HENT: Negative for congestion, dental problem, ear pain, nosebleeds, postnasal drip, rhinorrhea, sinus pressure, sneezing, sore throat and trouble swallowing.   Eyes: Negative for redness and itching.  Respiratory: Positive for cough. Negative for chest tightness, shortness of breath and wheezing.   Cardiovascular: Negative for palpitations and leg swelling.  Gastrointestinal: Negative for nausea and vomiting.  Genitourinary: Negative for dysuria.  Musculoskeletal: Negative for joint swelling.  Skin: Negative for rash.  Neurological: Negative for headaches.  Hematological: Does not bruise/bleed easily.  Psychiatric/Behavioral: Negative for dysphoric mood. The patient is not nervous/anxious.    Past Medical History:  Diagnosis Date  . Allergy   . Asthma    in past  . Cellulitis 2001    LLE ; [facility]  . Cellulitis 2007   R elbow ; S/ P drainage by Dr Telford Nab  . Cellulitis 2014   with sepsis  . Cellulitis 08/09/2014   LLE  . Eczema   . Edema   . Recurrent cellulitis of lower leg 08/15/2014  . Septic thrombophlebitis 08/15/2014  . Testicular cancer [facility]) 2004   Dr Jeffie Pollock     Family History  Problem Relation Age of Onset  . Heart disease Father        dysrrhymthmia  . Colon cancer Father   . Liver  cancer Father   . Transient ischemic attack Mother        in 59s  . Asthma Sister   . Cancer Paternal Grandfather        stomach  . Eczema Maternal Uncle   . Diabetes Neg Hx   . Colon polyps Neg Hx   . Esophageal cancer Neg Hx   . Rectal cancer Neg Hx   . Stomach cancer Neg Hx      Social History   Socioeconomic History  . Marital status: Married    Spouse name: Not on file  . Number of children: Not on file  . Years of education: Not on file  . Highest education level: Not on file  Occupational History  . Not on file    Tobacco Use  . Smoking status: Never Smoker  . Smokeless tobacco: Never Used  Substance and Sexual Activity  . Alcohol use: Yes    Alcohol/week: 12.0 standard drinks    Types: 12 Cans of beer per week    Comment: week  . Drug use: No  . Sexual activity: Yes  Other Topics Concern  . Not on file  Social History Narrative  . Not on file   Social Determinants of Health   Financial Resource Strain:   . Difficulty of Paying Living Expenses: Not on file  Food Insecurity:   . Worried About Charity fundraiser in the Last Year: Not on file  . Ran Out of Food in the Last Year: Not on file  Transportation Needs:   . Lack of Transportation (Medical): Not on file  . Lack of Transportation (Non-Medical): Not on file  Physical Activity:   . Days of Exercise per Week: Not on file  . Minutes of Exercise per Session: Not on file  Stress:   . Feeling of Stress : Not on file  Social Connections:   . Frequency of Communication with Friends and Family: Not on file  . Frequency of Social Gatherings with Friends and Family: Not on file  . Attends Religious Services: Not on file  . Active Member of Clubs or Organizations: Not on file  . Attends Archivist Meetings: Not on file  . Marital Status: Not on file  Intimate Partner Violence:   . Fear of Current or Ex-Partner: Not on file  . Emotionally Abused: Not on file  . Physically Abused: Not on file  . Sexually Abused: Not on file   Works in Press [employer]  Has lived in [place], [place]  No Known Allergies   Outpatient Medications Prior to Visit  Medication Sig Dispense Refill  . alendronate (FOSAMAX) 70 MG tablet Take by mouth.    Marland Kitchen augmented betamethasone dipropionate (DIPROLENE-AF) 0.05 % ointment APPLY TO AFFECTED AREAS OF ECZ[place] ON THE BODY AT BEDTIME 45 g 2  . desonide (DESOWEN) 0.05 % ointment APPLY TO AFFECTED AREA OF ECZEMA ON FACE AT BEDTIME. 60 g 2  . DUPIXENT 300 MG/2ML SOSY     . famotidine (PEPCID) 20 MG tablet Take 1 tablet (20 mg  total) by mouth 2 (two) times daily. 60 tablet 5  . Testosterone Cypionate 200 MG/ML SOLN     . allopurinol (ZYLOPRIM) 100 MG tablet Take 1 tablet (100 mg total) by mouth daily. (Patient not taking: Reported on 04/18/2019) 90 tablet 3  . fluticasone (FLONASE) 50 MCG/ACT nasal spray Place 1 spray into both nostrils daily.    Marland Kitchen loratadine (CLARITIN) 10 MG tablet Take 10 mg by mouth daily.  No facility-administered medications prior to visit.        Objective:   Physical Exam Vitals:   04/18/19 1029  BP: 138/76  Pulse: 76  Temp: (!) 97.1 F (36.2 C)  SpO2: 95%  Weight: 174 lb 12.8 oz (79.3 kg)  Height: 5\' 6"  (1.676 m)   Gen: Pleasant, well-nourished, in no distress,  normal affect  ENT: No lesions,  mouth clear,  oropharynx clear, no postnasal drip  Neck: No JVD, no stridor  Lungs: No use of accessory muscles, no crackles or wheezing on normal respiration, no wheeze on forced expiration  Cardiovascular: RRR, heart sounds normal, no murmur or gallops, no peripheral edema  Musculoskeletal: No deformities, no cyanosis or clubbing  Neuro: alert, awake, non focal  Skin: Warm, no lesions or rash      Assessment & Plan:  Asthma Pulmonary function testing is reassuring without any evidence of clear obstruction although he does have hyperinflated volumes and elevated DLCO both of which are suggestive of asthma even if not diagnostic.  Because his most significant symptom is chronic cough and given the PFT results I suspect that this is mainly upper airway in nature.  I do not think there is benefit to be had from adding a schedule bronchodilator.  We will continue albuterol as needed.  He is on Dupixent for eczema and certainly he may be getting some benefit from this if he does indeed have mild asthma.  Chronic cough With contributions from allergic rhinitis, GERD.  His GERD does still breakthrough at times.  I had him initially on a PPI plus Pepcid, now he is on Pepcid alone.   The cough is continued even on medication.  His bronchoscopy shows cobblestoning and posterior pharyngeal hyperplasia consistent with GERD.  He minimizes his rhinitis symptoms and I do not think there is any benefit to restarting an allergy regimen right now.  GERD (gastroesophageal reflux disease) He is currently on scheduled Pepcid.  He was at one point on a PPI plus H2 blocker but still had cough.  He is interested in a gastroenterology evaluation to assess for breakthrough GERD even on medication.  I did talk to him today about dietary restrictions   Baltazar Apo, MD, PhD 04/18/2019, 2:24 PM [facility] [phone] or if no answer [phone]

## 2019-04-18 NOTE — Assessment & Plan Note (Signed)
He is currently on scheduled Pepcid.  He was at one point on a PPI plus H2 blocker but still had cough.  He is interested in a gastroenterology evaluation to assess for breakthrough GERD even on medication.  I did talk to him today about dietary restrictions

## 2019-04-18 NOTE — Assessment & Plan Note (Signed)
Pulmonary function testing is reassuring without any evidence of clear obstruction although he does have hyperinflated volumes and elevated DLCO both of which are suggestive of asthma even if not diagnostic.  Because his most significant symptom is chronic cough and given the PFT results I suspect that this is mainly upper airway in nature.  I do not think there is benefit to be had from adding a schedule bronchodilator.  We will continue albuterol as needed.  He is on Dupixent for eczema and certainly he may be getting some benefit from this if he does indeed have mild asthma.

## 2019-04-18 NOTE — Patient Instructions (Signed)
Your pulmonary function testing does not show definitive asthma, does have some suggestions of prior asthma on your lung volumes.  I do not believe we need to start an every day inhaled medication right now. Your chronic cough probably does not relate to asthma but instead to upper airway irritation. We will refer you to see gastroenterology to discuss suspected breakthrough reflux irritating your throat even though you have been on multiple reflux medications. Continue your Dupixent as per Dr. Donneta Romberg for your eczema. Follow with Dr Lamonte Sakai in 6 months or sooner if you have any problems

## 2019-04-25 ENCOUNTER — Other Ambulatory Visit: Payer: Self-pay

## 2019-04-25 ENCOUNTER — Ambulatory Visit (INDEPENDENT_AMBULATORY_CARE_PROVIDER_SITE_OTHER): Payer: 59

## 2019-04-25 ENCOUNTER — Encounter: Payer: Self-pay | Admitting: Podiatry

## 2019-04-25 ENCOUNTER — Ambulatory Visit (INDEPENDENT_AMBULATORY_CARE_PROVIDER_SITE_OTHER): Payer: 59 | Admitting: Podiatry

## 2019-04-25 DIAGNOSIS — M778 Other enthesopathies, not elsewhere classified: Secondary | ICD-10-CM

## 2019-04-26 NOTE — Progress Notes (Signed)
He presents today with a chief complaint of a painful midfoot right.  He states that I am still having issues with this foot is in a different spot now not not like it was.  ROS: Denies fever chills nausea vomiting muscle aches pains calf pain back pain chest pain shortness of breath.   Objective: Vital signs stable alert oriented x3.  Pulses are palpable.  Neurologic sensorium is intact deep tendon reflexes are intact muscle strength is normal symmetrical.  Orthopedic evaluation demonstrates some tenderness on range of motion of the midfoot particular of the talonavicular joint extending dorsally.  He also has some erythema what appears to be some sloughing of the very superficial epidermis most likely associated with inflammation of the tendon tibialis anterior very much as if he has been wearing tight boots or some type device around the foot that caused the swelling and then the swelling went down.  Radiographically significant osteoarthritis of midtarsal joint.  Assessment: Osteoarthritis of the midfoot.  Plan: We will send him to [facility] to have a set of orthotics made.  I will follow-up with him once those come in.

## 2019-05-04 ENCOUNTER — Other Ambulatory Visit: Payer: Self-pay

## 2019-05-04 ENCOUNTER — Other Ambulatory Visit: Payer: 59 | Admitting: Orthotics

## 2019-05-04 DIAGNOSIS — M778 Other enthesopathies, not elsewhere classified: Secondary | ICD-10-CM

## 2019-06-07 ENCOUNTER — Encounter: Payer: 59 | Admitting: Orthotics

## 2019-06-22 ENCOUNTER — Encounter: Payer: 59 | Admitting: Orthotics

## 2019-07-10 ENCOUNTER — Other Ambulatory Visit: Payer: Self-pay

## 2019-07-10 ENCOUNTER — Ambulatory Visit: Payer: 59 | Admitting: Orthotics

## 2019-07-10 DIAGNOSIS — M778 Other enthesopathies, not elsewhere classified: Secondary | ICD-10-CM

## 2019-07-10 DIAGNOSIS — M19079 Primary osteoarthritis, unspecified ankle and foot: Secondary | ICD-10-CM

## 2019-07-10 DIAGNOSIS — M87076 Idiopathic aseptic necrosis of unspecified foot: Secondary | ICD-10-CM

## 2019-07-10 NOTE — Progress Notes (Signed)
Patient came in today to pick up custom made foot orthotics.  The goals were accomplished and the patient reported no dissatisfaction with said orthotics.  Patient was advised of breakin period and how to report any issues. 

## 2019-07-25 ENCOUNTER — Encounter: Payer: Self-pay | Admitting: Physician Assistant

## 2019-07-25 ENCOUNTER — Other Ambulatory Visit: Payer: Self-pay

## 2019-07-25 ENCOUNTER — Ambulatory Visit: Payer: 59 | Admitting: Physician Assistant

## 2019-07-25 VITALS — BP 140/80 | HR 84 | Temp 98.9°F | Ht 64.5 in | Wt 172.4 lb

## 2019-07-25 DIAGNOSIS — R053 Chronic cough: Secondary | ICD-10-CM

## 2019-07-25 DIAGNOSIS — K219 Gastro-esophageal reflux disease without esophagitis: Secondary | ICD-10-CM

## 2019-07-25 DIAGNOSIS — R05 Cough: Secondary | ICD-10-CM

## 2019-07-25 DIAGNOSIS — R1013 Epigastric pain: Secondary | ICD-10-CM

## 2019-07-25 NOTE — Progress Notes (Signed)
Chief Complaint: Chronic cough  HPI:    Gregory Phillips is a 64 year old male with a past medical history as listed below, known to Dr. Hilarie Fredrickson, who was referred to me by Marin Olp, MD for a complaint of chronic cough.      05/07/2017 colonoscopy for adenomatous polyp surveillance and family history of colon cancer with removal of one 2 mm polyp in the ascending colon, 3 mm polyp in the transverse colon, 5 mm polyp in the rectum, Mild diverticulosis in the sigmoid colon and internal hemorrhoids and perianal skin tags.  Pathology showed mixture of tubular adenoma and hyperplastic polyp.  Repeat recommended in 5 years.    11/03/2018 bronchoscopy was normal.  Hyperplastic changes in his larynx.    04/18/2019 patient saw pulmonary medicine for chronic cough.  At that time reported having cough 3 to 4 years ago which was dry at first.  He was tried on allergy meds.  Sometimes had yellow mucus.  He thought that changed from Prevacid to Prilosec 40 mg daily had helped some.  It was thought that his chronic cough had contributions from allergic rhinitis and reflux.  At last visit he was on Pepcid alone after being tried on a PPI plus Pepcid.    Today, the patient presents clinic and tells me he has been to see an allergist and also been to see pulmonology who all say that he needs further evaluation by GI for his chronic cough.  This cough has been happening for the past 3 to 4 years and seems to be getting worse and more chronic now.  At one point was having a worse problem with reflux which they thought was related and he was put on Omeprazole 40 mg twice a day and Pepcid.  This was stopped back in September after his bronchoscopy and he was continued on just Pepcid due to his history of osteoporosis.  Tells me now that his reflux is slightly more controlled but he does have problems in the morning when he wakes up.  Tells me he takes his Pepcid 20 mg just twice throughout the day at some point.  He  does not really schedule it.    Denies fever, chills, weight loss, nausea or vomiting.  Past Medical History:  Diagnosis Date  . Allergy   . Asthma    in past  . Cellulitis 2001    LLE ; [facility]  . Cellulitis 2007   R elbow ; S/ P drainage by Dr Telford Nab  . Cellulitis 2014   with sepsis  . Cellulitis 08/09/2014   LLE  . Eczema   . Edema   . Recurrent cellulitis of lower leg 08/15/2014  . Septic thrombophlebitis 08/15/2014  . Testicular cancer [facility] 2004   Dr Jeffie Pollock    Past Surgical History:  Procedure Laterality Date  . COLONOSCOPY W/ POLYPECTOMY  05/14/2008 & 04/2012   Tubular adenoma; Dr. Verl Blalock  . FOOT FRACTURE SURGERY Left 05/2012  . FRACTURE SURGERY    . I & D EXTREMITY Left 2004   elbow  . KNEE ARTHROPLASTY Right     Dr Noemi Chapel,  for patellar instability  . KNEE ARTHROSCOPY Right 1981    Dr Noemi Chapel  . REFRACTIVE SURGERY Bilateral 2004  . testicle removed Right    pt. not 100 percent sure  . TUMOR EXCISION  2005    Seminoma; Dr Jeffie Pollock  . VIDEO BRONCHOSCOPY N/A 11/03/2018   Procedure: VIDEO BRONCHOSCOPY WITHOUT FLUORO;  Surgeon: Collene Gobble, MD;  Location: [facility] ENDOSCOPY;  Service: Cardiopulmonary;  Laterality: N/A;    Current Outpatient Medications  Medication Sig Dispense Refill  . alendronate (FOSAMAX) 70 MG tablet Take by mouth.    Marland Kitchen augmented betamethasone dipropionate (DIPROLENE-AF) 0.05 % ointment APPLY TO AFFECTED AREAS OF ECZEMA ON THE BODY AT BEDTIME 45 g 2  . desonide (DESOWEN) 0.05 % ointment APPLY TO AFFECTED AREA OF ECZEMA ON FACE AT BEDTIME. 60 g 2  . DUPIXENT 300 MG/2ML SOSY     . famotidine (PEPCID) 20 MG tablet Take 1 tablet (20 mg total) by mouth 2 (two) times daily. 60 tablet 5  . testosterone cypionate (DEPOTESTOSTERONE CYPIONATE) 200 MG/ML injection     . Testosterone Cypionate 200 MG/ML SOLN      No current facility-administered medications for this visit.    Allergies as of 07/25/2019  . (No Known Allergies)     Family History  Problem Relation Age of Onset  . Heart disease Father        dysrrhymthmia  . Colon cancer Father   . Liver cancer Father   . Transient ischemic attack Mother        in 57s  . Asthma Sister   . Cancer Paternal Grandfather        stomach  . Eczema Maternal Uncle   . Diabetes Neg Hx   . Colon polyps Neg Hx   . Esophageal cancer Neg Hx   . Rectal cancer Neg Hx   . Stomach cancer Neg Hx     Social History   Socioeconomic History  . Marital status: Married    Spouse name: Not on file  . Number of children: Not on file  . Years of education: Not on file  . Highest education level: Not on file  Occupational History  . Not on file  Tobacco Use  . Smoking status: Never Smoker  . Smokeless tobacco: Never Used  Substance and Sexual Activity  . Alcohol use: Yes    Alcohol/week: 12.0 standard drinks    Types: 12 Cans of beer per week    Comment: week  . Drug use: No  . Sexual activity: Yes  Other Topics Concern  . Not on file  Social History Narrative  . Not on file   Social Determinants of Health   Financial Resource Strain:   . Difficulty of Paying Living Expenses:   Food Insecurity:   . Worried About Charity fundraiser in the Last Year:   . Arboriculturist in the Last Year:   Transportation Needs:   . Film/video editor (Medical):   Marland Kitchen Lack of Transportation (Non-Medical):   Physical Activity:   . Days of Exercise per Week:   . Minutes of Exercise per Session:   Stress:   . Feeling of Stress :   Social Connections:   . Frequency of Communication with Friends and Family:   . Frequency of Social Gatherings with Friends and Family:   . Attends Religious Services:   . Active Member of Clubs or Organizations:   . Attends Archivist Meetings:   Marland Kitchen Marital Status:   Intimate Partner Violence:   . Fear of Current or Ex-Partner:   . Emotionally Abused:   Marland Kitchen Physically Abused:   . Sexually Abused:     Review of Systems:     Constitutional: No weight loss, fever or chills Cardiovascular: No chest pain Respiratory: No SOB  Gastrointestinal: See HPI and  otherwise negative   Physical Exam:  Vital signs: BP 140/80 (BP Location: Left Arm, Patient Position: Sitting, Cuff Size: Normal)   Pulse 84   Temp 98.9 F (37.2 C)   Ht 5' 4.5" (1.638 m) Comment: height measured without shoes  Wt 172 lb 6 oz (78.2 kg)   BMI 29.13 kg/m   Constitutional:   Pleasant Caucasian male appears to be in NAD, Well developed, Well nourished, alert and cooperative.  Respiratory: Respirations even and unlabored. Lungs clear to auscultation bilaterally.   No wheezes, crackles, or rhonchi.  Cardiovascular: Normal S1, S2. No MRG. Regular rate and rhythm. No peripheral edema, cyanosis or pallor.  Gastrointestinal:  Soft, nondistended, mild epigastric ttp, No rebound or guarding. Normal bowel sounds. No appreciable masses or hepatomegaly. Rectal:  Not performed.  Psychiatric: Demonstrates good judgement and reason without abnormal affect or behaviors.  No recent labs.  Assessment: 1.  Chronic cough: Worked up by a pulmonologist and allergist with no findings for etiology, they recommend eval by [place] 2.  GERD: Uncontrolled in the mornings on Pepcid 20 twice a day which the patient is not scheduling; consider gastritis+/-H. pylori versus other 3.  Epigastric pain: At time of exam today, consider relation to above  Plan: 1.  Scheduled patient for an EGD for further evaluation of his chronic cough and history of reflux with epigastric pain on exam today with Dr. Hilarie Fredrickson in the [facility].  Did discuss risks, benefits, limitations and alternatives and the patient agrees to proceed. 2.  Discussed the use of PPIs.  Patient tells me he was not advised to do so any longer due to his osteoporosis.  Did tell him to schedule out his Pepcid taking 1 every morning and 1 nightly which may help more with his reflux in the mornings. 3.  Patient to return to clinic  per recommendations from Dr. Hilarie Fredrickson after time of procedure.  Ellouise Newer, PA-C [facility] 07/25/2019, 2:58 PM  Cc: Marin Olp, MD

## 2019-07-25 NOTE — Patient Instructions (Signed)
If you are age 62 or older, your body mass index should be between 23-30. Your Body mass index is 29.13 kg/m. If this is out of the aforementioned range listed, please consider follow up with your Primary Care Provider.  If you are age 71 or younger, your body mass index should be between 19-25. Your Body mass index is 29.13 kg/m. If this is out of the aformentioned range listed, please consider follow up with your Primary Care Provider.   You have been scheduled for an endoscopy. Please follow written instructions given to you at your visit today. If you use inhalers (even only as needed), please bring them with you on the day of your procedure.

## 2019-08-02 NOTE — Progress Notes (Signed)
Addendum: Reviewed and agree with assessment and management plan. Momin Misko M, MD  

## 2019-08-07 ENCOUNTER — Encounter: Payer: Self-pay | Admitting: Internal Medicine

## 2019-08-07 ENCOUNTER — Ambulatory Visit (AMBULATORY_SURGERY_CENTER): Payer: 59 | Admitting: Internal Medicine

## 2019-08-07 ENCOUNTER — Other Ambulatory Visit: Payer: Self-pay

## 2019-08-07 VITALS — BP 133/92 | HR 76 | Temp 97.7°F | Resp 14 | Ht 64.0 in | Wt 172.0 lb

## 2019-08-07 DIAGNOSIS — K449 Diaphragmatic hernia without obstruction or gangrene: Secondary | ICD-10-CM

## 2019-08-07 DIAGNOSIS — R053 Chronic cough: Secondary | ICD-10-CM

## 2019-08-07 DIAGNOSIS — K2950 Unspecified chronic gastritis without bleeding: Secondary | ICD-10-CM | POA: Diagnosis not present

## 2019-08-07 DIAGNOSIS — K3189 Other diseases of stomach and duodenum: Secondary | ICD-10-CM | POA: Diagnosis not present

## 2019-08-07 DIAGNOSIS — K219 Gastro-esophageal reflux disease without esophagitis: Secondary | ICD-10-CM

## 2019-08-07 DIAGNOSIS — K2970 Gastritis, unspecified, without bleeding: Secondary | ICD-10-CM

## 2019-08-07 HISTORY — DX: Gastritis, unspecified, without bleeding: K29.70

## 2019-08-07 MED ORDER — SODIUM CHLORIDE 0.9 % IV SOLN: 500.0000 mL | Freq: Once | INTRAVENOUS | Status: DC

## 2019-08-07 NOTE — Patient Instructions (Signed)
ESOPHAGEAL BIOPSIES DONE - AWAIT PATHOLOGY RESULTS  GASTRIC BIOPSIES DONE -AWAIT PATHOLOGY RESULTS - HANDOUT ON GASTRITIS GIVEN TO YOU TODAY- HANDOUT ON REFLUX AND HIATAL HERNIA GIVEN TO YOU TODAY  FAMOTIDINE ( PEPCID ) 20 MG TWICE DAILY TO SEE IF CHRONIC COUGH IMPROVES ( 12 HRS APART)  FOLLOW UP IN OFFICE WITH DR PYRTLE IN 2-3 MONTHS - MAKE THIS APPOINTMENT    YOU HAD AN ENDOSCOPIC PROCEDURE TODAY AT THE [facility]:   Refer to the procedure report that was given to you for any specific questions about what was found during the examination.  If the procedure report does not answer your questions, please call your gastroenterologist to clarify.  If you requested that your care partner not be given the details of your procedure findings, then the procedure report has been included in a sealed envelope for you to review at your convenience later.  YOU SHOULD EXPECT: Some feelings of bloating in the abdomen. Passage of more gas than usual.  Walking can help get rid of the air that was put into your GI tract during the procedure and reduce the bloating. If you had a lower endoscopy (such as a colonoscopy or flexible sigmoidoscopy) you may notice spotting of blood in your stool or on the toilet paper. If you underwent a bowel prep for your procedure, you may not have a normal bowel movement for a few days.  Please Note:  You might notice some irritation and congestion in your nose or some drainage.  This is from the oxygen used during your procedure.  There is no need for concern and it should clear up in a day or so.  SYMPTOMS TO REPORT IMMEDIATELY:     Following upper endoscopy (EGD)  Vomiting of blood or coffee ground material  New chest pain or pain under the shoulder blades  Painful or persistently difficult swallowing  New shortness of breath  Fever of 100F or higher  Black, tarry-looking stools  For urgent or emergent issues, a gastroenterologist can be reached at any  hour by calling [phone]. Do not use MyChart messaging for urgent concerns.    DIET:  We do recommend a small meal at first, but then you may proceed to your regular diet.  Drink plenty of fluids but you should avoid alcoholic beverages for 24 hours.  ACTIVITY:  You should plan to take it easy for the rest of today and you should NOT DRIVE or use heavy machinery until tomorrow (because of the sedation medicines used during the test).    FOLLOW UP: Our staff will call the number listed on your records 48-72 hours following your procedure to check on you and address any questions or concerns that you may have regarding the information given to you following your procedure. If we do not reach you, we will leave a message.  We will attempt to reach you two times.  During this call, we will ask if you have developed any symptoms of COVID 19. If you develop any symptoms (ie: fever, flu-like symptoms, shortness of breath, cough etc.) before then, please call [phone].  If you test positive for Covid 19 in the 2 weeks post procedure, please call and report this information to [place].    If any biopsies were taken you will be contacted by phone or by letter within the next 1-3 weeks.  Please call us at [phone] if you have not heard about the biopsies in 3 weeks.  SIGNATURES/CONFIDENTIALITY: You and/or your care partner have signed paperwork which will be entered into your electronic medical record.  These signatures attest to the fact that that the information above on your After Visit Summary has been reviewed and is understood.  Full responsibility of the confidentiality of this discharge information lies with you and/or your care-partner.

## 2019-08-07 NOTE — Op Note (Signed)
[facility] Patient Name: Gregory Phillips Procedure Date: 08/07/2019 2:19 PM MRN: [id number] Endoscopist: Jerene Bears , MD Age: 64 Referring MD:  Date of Birth: [birth year] Gender: Male Account #: [id number] Procedure:                Upper GI endoscopy Indications:              Chronic cough to evaluate for GERD Medicines:                Monitored Anesthesia Care Procedure:                Pre-Anesthesia Assessment:                           - Prior to the procedure, a History and Physical                            was performed, and patient medications and                            allergies were reviewed. The patient's tolerance of                            previous anesthesia was also reviewed. The risks                            and benefits of the procedure and the sedation                            options and risks were discussed with the patient.                            All questions were answered, and informed consent                            was obtained. Prior Anticoagulants: The patient has                            taken no previous anticoagulant or antiplatelet                            agents. ASA Grade Assessment: III - A patient with                            severe systemic disease. After reviewing the risks                            and benefits, the patient was deemed in                            satisfactory condition to undergo the procedure.                           After obtaining informed consent, the endoscope was  passed under direct vision. Throughout the                            procedure, the patient's blood pressure, pulse, and                            oxygen saturations were monitored continuously. The                            Endoscope was introduced through the mouth, and                            advanced to the second part of duodenum. The upper                            GI endoscopy was  accomplished without difficulty.                            The patient tolerated the procedure well. Scope In: Scope Out: Findings:                 Mild mucosal changes characterized by longitudinal                            markings were found in the middle third of the                            esophagus and in the lower third of the esophagus.                            Biopsies were obtained from the proximal and distal                            esophagus with cold forceps for histology to                            exclude eosinophilic esophagitis.                           The Z-line was irregular and was found 32 cm from                            the incisors with an island of salmon-colored                            mucosa just above the Z-line. Biopsies were taken                            with a cold forceps for histology.                           A 3 cm hiatal hernia was found. The proximal extent  of the gastric folds (end of tubular esophagus) was                            32 cm from the incisors. The hiatal narrowing was                            35 cm from the incisors.                           The gastroesophageal flap valve was visualized                            endoscopically and classified as Hill Grade III                            (minimal fold, loose to endoscope, hiatal hernia                            likely).                           Mild inflammation characterized by erythema and                            nodularity was found in the gastric body and in the                            gastric antrum. Biopsies were taken with a cold                            forceps for histology and Helicobacter pylori                            testing.                           The examined duodenum was normal. Complications:            No immediate complications. Estimated Blood Loss:     Estimated blood loss was  minimal. Impression:               - Subtle mucosal changes in the mid and lower                            esophagus. Biopsied.                           - Z-line irregular, 32 cm from the incisors.                            Biopsied.                           - 3 cm hiatal hernia.                           -  Gastritis. Biopsied.                           - Normal examined duodenum. Recommendation:           - Patient has a contact number available for                            emergencies. The signs and symptoms of potential                            delayed complications were discussed with the                            patient. Return to normal activities tomorrow.                            Written discharge instructions were provided to the                            patient.                           - Resume previous diet.                           - Continue present medications. Would recommend                            famotidine 20 mg twice daily to see if chronic                            cough improves. Reflux is suspected based on                            today's findings, though if no improvement in cough                            then we could try famotidine 40 mg twice daily and                            consider 24 hr pH and impedence testing.                           - Await pathology results.                           - Office follow-up in 2-3 months. Jerene Bears, MD 08/07/2019 2:57:36 PM This report has been signed electronically.

## 2019-08-07 NOTE — Progress Notes (Signed)
Called to room to assist during endoscopic procedure.  Patient ID and intended procedure confirmed with present staff. Received instructions for my participation in the procedure from the performing physician.  

## 2019-08-07 NOTE — Progress Notes (Signed)
pt tolerated well. VSS. awake and to recovery. Report given to RN.  

## 2019-08-07 NOTE — Progress Notes (Signed)
Temp by LC VS by CW.

## 2019-08-09 ENCOUNTER — Telehealth: Payer: Self-pay

## 2019-08-09 ENCOUNTER — Telehealth: Payer: Self-pay | Admitting: *Deleted

## 2019-08-09 NOTE — Telephone Encounter (Signed)
No answer, left message to call back later today, B.Melton Walls RN. 

## 2019-08-09 NOTE — Telephone Encounter (Signed)
Second follow up call attempt.  No answer.  Message left earlier today. 

## 2019-08-11 ENCOUNTER — Encounter: Payer: Self-pay | Admitting: Internal Medicine

## 2019-10-04 ENCOUNTER — Other Ambulatory Visit: Payer: Self-pay | Admitting: Emergency Medicine

## 2019-10-10 ENCOUNTER — Telehealth: Payer: Self-pay | Admitting: Emergency Medicine

## 2019-10-10 MED ORDER — BENZONATATE 200 MG PO CAPS: 200.0000 mg | ORAL_CAPSULE | Freq: Three times a day (TID) | ORAL | 5 refills | Status: DC | PRN

## 2019-10-10 NOTE — Telephone Encounter (Signed)
Called pharmacy, refill request was never sent. Refill of tessalon sent to preferred pharmacy.

## 2019-10-12 ENCOUNTER — Encounter: Payer: Self-pay | Admitting: *Deleted

## 2019-11-05 ENCOUNTER — Other Ambulatory Visit: Payer: Self-pay | Admitting: Emergency Medicine

## 2019-11-06 ENCOUNTER — Ambulatory Visit: Payer: 59 | Admitting: Internal Medicine

## 2019-12-24 ENCOUNTER — Other Ambulatory Visit: Payer: Self-pay | Admitting: Family Medicine

## 2020-01-26 ENCOUNTER — Ambulatory Visit: Payer: 59 | Admitting: Internal Medicine

## 2020-01-26 ENCOUNTER — Encounter: Payer: Self-pay | Admitting: Internal Medicine

## 2020-01-26 VITALS — BP 136/80 | HR 85 | Ht 65.0 in | Wt 169.0 lb

## 2020-01-26 DIAGNOSIS — R053 Chronic cough: Secondary | ICD-10-CM | POA: Diagnosis not present

## 2020-01-26 DIAGNOSIS — K219 Gastro-esophageal reflux disease without esophagitis: Secondary | ICD-10-CM | POA: Diagnosis not present

## 2020-01-26 DIAGNOSIS — K2100 Gastro-esophageal reflux disease with esophagitis, without bleeding: Secondary | ICD-10-CM

## 2020-01-26 MED ORDER — DEXILANT 60 MG PO CPDR
60.0000 mg | DELAYED_RELEASE_CAPSULE | Freq: Every morning | ORAL | 1 refills | Status: DC
Start: 2020-01-26 — End: 2021-03-06

## 2020-01-26 MED ORDER — FAMOTIDINE 20 MG PO TABS: 20.0000 mg | ORAL_TABLET | Freq: Every day | ORAL | 1 refills | Status: DC

## 2020-01-26 NOTE — Progress Notes (Signed)
gerd 

## 2020-01-26 NOTE — Patient Instructions (Signed)
We have sent the following medications to your pharmacy for you to pick up at your convenience: Dexilant 60 mg every morning Pepcid 20 mg every night  Please follow up with Dr Hilarie Fredrickson in the office in 2-3 months.  If you are age 62 or younger, your body mass index should be between 19-25. Your Body mass index is 28.12 kg/m. If this is out of the aformentioned range listed, please consider follow up with your Primary Care Provider.   Due to recent changes in healthcare laws, you may see the results of your imaging and laboratory studies on MyChart before your provider has had a chance to review them.  We understand that in some cases there may be results that are confusing or concerning to you. Not all laboratory results come back in the same time frame and the provider may be waiting for multiple results in order to interpret others.  Please give [place] 48 hours in order for your provider to thoroughly review all the results before contacting the office for clarification of your results.

## 2020-01-26 NOTE — Progress Notes (Signed)
   Subjective:    Patient ID: Gregory Hard., male    DOB: [birth year], 64 y.o.   MRN: [id number]  HPI Gregory Phillips is a 64 year old male with a past medical history of GERD with esophagitis, chronic cough, adenomatous colon polyps, family history of colon cancer, osteoporosis who is here for follow-up.  He came for EGD on 08/07/2019 to evaluate chronic cough. This showed mild mucosal changes in the middle and lower third of the esophagus including longitudinal markings. The Z-line was irregular with an island of salmon-colored mucosa just proximally. A 3 cm hiatal hernia was found. There was mild erythema and nodularity in the gastric body and antrum which was biopsied. Biopsies from the esophagus showed minimally inflamed squamous mucosa without evidence for eosinophilic esophagitis. GE junction biopsy showed mild inflammation consistent with reflux. No Barrett's esophagus. Gastric biopsy showed chronic inactive gastritis with intestinal metaplasia but no H. pylori. No dysplasia.  At that time I increased his famotidine to 20 mg twice daily as he was only taking this once daily. PPI which had been used previously was avoided due to osteoporosis.  He reports that he actually continues to have ongoing issues with reflux symptoms though heartburn is better. He will occasionally have breakthrough heartburn but he is having ongoing globus sensation and nonproductive cough. This is not bothering him during sleep. No abdominal pain or change in bowel habit.  He is taking Fosamax and testosterone injection under direction of Dr. Iran Planas who is helping him manage his bone disease. He sees Dr. Yong Channel for primary care  Review of Systems As per HPI, otherwise negative  Current Medications, Allergies, Past Medical History, Past Surgical History, Family History and Social History were reviewed in [place] record.     Objective:   Physical Exam BP 136/80   Pulse 85    Ht 5\' 5"  (1.651 m)   Wt 169 lb (76.7 kg)   BMI 28.12 kg/m  Gen: awake, alert, NAD     Assessment & Plan:  64 year old male with a past medical history of GERD with esophagitis, chronic cough, adenomatous colon polyps, family history of colon cancer, osteoporosis who is here for follow-up.  1. GERD with chronic cough and globus sensation --he does have history of esophagitis documented by biopsy earlier this year. His heartburn symptom is better but he is continuing to have globus sensation and chronic cough which I feel is likely related to reflux which is uncontrolled. We have discussed this today. I have recommended the following --Trial of Dexilant 60 mg in the morning, continue Pepcid but only 20 mg in the evening or near hour of sleep. --I asked that he discuss reinitiation of PPI therapy with Dr. Tamala Julian who is helping him manage his bone disease. We did discuss how PPI can have impact on calcium and magnesium metabolism and theoretically can exacerbate metabolic bone disease  2 to 82-month follow-up  20 minutes total spent today including patient facing time, coordination of care, reviewing medical history/procedures/pertinent radiology studies, and documentation of the encounter.

## 2020-06-03 NOTE — Patient Instructions (Addendum)
Metoprolol is usually first line for this type of tremor if you decide you would like to try this.   Blood pressure high normal- please do home monitoring- we want your home average >135/85- if not below this would give [place] a double benefit to trying metoprolol as may also help tremor and blood pressure  Try trazodone for sleep up to 2-3 days a week (honestly can use nightly but if can use less that's great)   Essential Tremor A tremor is trembling or shaking that a person cannot control. Most tremors affect the hands or arms. Tremors can also affect the head, vocal cords, legs, and other parts of the body. Essential tremor is a tremor without a known cause. Usually, it occurs while a person is trying to perform an action. It tends to get worse gradually as a person ages. What are the causes? The cause of this condition is not known. What increases the risk? You are more likely to develop this condition if:  You have a family member with essential tremor.  You are age 74 or older. What are the signs or symptoms? The main sign of a tremor is a rhythmic shaking of certain parts of your body that is uncontrolled and unintentional. You may:  Have difficulty eating with a spoon or fork.  Have difficulty writing.  Nod your head up and down or side to side.  Have a quivering voice. The shaking may:  Get worse over time.  Come and go.  Be more noticeable on one side of your body.  Get worse due to stress, fatigue, caffeine, and extreme heat or cold. How is this diagnosed? This condition may be diagnosed based on:  Your symptoms and medical history.  A physical exam. There is no single test to diagnose an essential tremor. However, your health care provider may order tests to rule out other causes of your condition. These may include:  Blood and urine tests.  Imaging studies of your brain, such as CT scan and MRI.  A test that measures involuntary muscle movement  (electromyogram).   How is this treated? Treatment for essential tremor depends on the severity of the condition.  Some tremors may go away without treatment.  Mild tremors may not need treatment if they do not affect your day-to-day life.  Severe tremors may need to be treated using one or more of the following options: ? Medicines. ? Lifestyle changes. Like avoiding caffeine ? Occupational or physical therapy. Follow these instructions at home: Lifestyle  Do not use any products that contain nicotine or tobacco, such as cigarettes and e-cigarettes. If you need help quitting, ask your health care provider.  Limit your caffeine intake as told by your health care provider.  Try to get 8 hours of sleep each night.  Find ways to manage your stress that fits your lifestyle and personality. Consider trying meditation or yoga.  Try to anticipate stressful situations and allow extra time to manage them.  If you are struggling emotionally with the effects of your tremor, consider working with a mental health provider.   General instructions  Take over-the-counter and prescription medicines only as told by your health care provider.  Avoid extreme heat and extreme cold.  Keep all follow-up visits as told by your health care provider. This is important. Visits may include physical therapy visits. Contact a health care provider if:  You experience any changes in the location or intensity of your tremors.  You start having a  tremor after starting a new medicine.  You have tremor with other symptoms, such as: ? Numbness. ? Tingling. ? Pain. ? Weakness.  Your tremor gets worse.  Your tremor interferes with your daily life.  You feel down, blue, or sad for at least 2 weeks in a row.  Worrying about your tremor and what other people think about you interferes with your everyday life functions, including relationships, work, or school. Summary  Essential tremor is a tremor  without a known cause. Usually, it occurs when you are trying to perform an action.  You are more likely to develop this condition if you have a family member with essential tremor.  The main sign of a tremor is a rhythmic shaking of certain parts of your body that is uncontrolled and unintentional.  Treatment for essential tremor depends on the severity of the condition. This information is not intended to replace advice given to you by your health care provider. Make sure you discuss any questions you have with your health care provider. Document Revised: 12/01/2019 Document Reviewed: 12/01/2019 Elsevier Patient Education  2021 [place].  Recommended follow up: Return in about 4 months (around 10/06/2020) for physical or sooner if needed.

## 2020-06-03 NOTE — Progress Notes (Signed)
Phone [phone] In person visit   Subjective:   Gregory Phillips. is a 65 y.o. year old very pleasant male patient who presents for/with See problem oriented charting Chief Complaint  Patient presents with   Tremors    In his hand mostly in his fingers.    This visit occurred during the SARS-CoV-2 public health emergency.  Safety protocols were in place, including screening questions prior to the visit, additional usage of staff PPE, and extensive cleaning of exam room while observing appropriate contact time as indicated for disinfecting solutions.   Past Medical History-  Patient Active Problem List   Diagnosis Date Noted   Hypogonadism male 11/09/2018    Priority: High   Osteoporosis 06/29/2017    Priority: High   Recurrent cellulitis of lower leg 08/15/2014    Priority: High   Atopic eczema 03/21/2007    Priority: High   Insomnia 06/06/2020    Priority: Medium   Essential tremor 06/06/2020    Priority: Medium   Hiatal hernia 06/06/2020    Priority: Medium   Gout 11/24/2018    Priority: Medium   Allergic reaction 11/09/2018    Priority: Medium   Chronic cough 09/16/2018    Priority: Medium   Lymphedema     Priority: Medium   Other abnormal glucose 08/13/2013    Priority: Medium   Asthma 03/28/2009    Priority: Medium   Seminoma of descended right testis ([place]) 03/21/2007    Priority: Medium   HYPERLIPIDEMIA 03/21/2007    Priority: Medium   Metatarsal stress fracture of right foot 08/05/2017    Priority: Low   Erectile dysfunction 12/09/2015    Priority: Low   GERD (gastroesophageal reflux disease) 12/09/2015    Priority: Low   Hair loss 08/23/2014    Priority: Low   Septic thrombophlebitis 08/15/2014    Priority: Low   History of adenomatous polyp of colon 03/28/2009    Priority: Low   Eczema 06/29/2012    Medications- reviewed and updated Current Outpatient Medications  Medication Sig Dispense Refill   alendronate  (FOSAMAX) 70 MG tablet Take by mouth.     allopurinol (ZYLOPRIM) 100 MG tablet Take 100 mg by mouth daily.     augmented betamethasone dipropionate (DIPROLENE-AF) 0.05 % ointment APPLY TO THE AFFECTED AREA OF ECZEMA ON THE BODY AT BEDTIME 50 g 1   desonide (DESOWEN) 0.05 % ointment APPLY TO AFFECTED AREA OF ECZEMA ON FACE AT BEDTIME. 60 g 2   dexlansoprazole (DEXILANT) 60 MG capsule Take 1 capsule (60 mg total) by mouth in the morning. 90 capsule 1   DUPIXENT 300 MG/2ML SOSY      famotidine (PEPCID) 20 MG tablet Take 1 tablet (20 mg total) by mouth at bedtime. 90 tablet 1   testosterone cypionate (DEPOTESTOSTERONE CYPIONATE) 200 MG/ML injection      traZODone (DESYREL) 50 MG tablet Take 0.5-1 tablets (25-50 mg total) by mouth at bedtime as needed for sleep. 30 tablet 3   benzonatate (TESSALON) 200 MG capsule Take 1 capsule (200 mg total) by mouth 3 (three) times daily as needed for cough. Two to three times daily as needed (Patient not taking: Reported on 06/06/2020) 90 capsule 5   No current facility-administered medications for this visit.     Objective:  BP (!) 156/98    Pulse 78    Temp 97.8 F (36.6 C) (Temporal)    Ht 5\' 5"  (1.651 m)    Wt 167 lb (75.8 kg)  SpO2 98%    BMI 27.79 kg/m  Gen: NAD, resting comfortably CV: RRR no murmurs rubs or gallops Lungs: CTAB no crackles, wheeze, rhonchi Ext: no edema Skin: warm, dry Neuro: No resting tremor noted, intention tremor noted.  No cogwheel rigidity.  Normal gait    Assessment and Plan  #Hand tremor S:over the last few months patient has noted with picking up objects he noted some tremulousness. Notes it if trying to hold finger stills the hand shakes- doesn't seem to be in the whole arm. worse with small objects.    Alcohol does seem to calm down the tremor but next day seems worse. Also has this more pronounced after using something that vibrates like weed whacker.   No resting tremors. No change to gait.  A/P: This  certainly looks and sounds like essential tremor.  He does not want to add additional medicine at that time though we discussed options like metoprolol.  # Insomnia S:years of issues. Can fall asleep but hard time staying asleep. Usual bedtime around midnight and may be up within 3 hours. Typical total nights sleep is 4 hours. May fall back at 7-8 AM. fels tired when wakes up.   Does some screens within an hour of bed. tv in bedroom. Does stay in bed if cannot sleep. Room is dark but could use black out shades. Wife snores.    Gets some uneasy feeling atop the cross of his head at times- such as if driving between trucks. Feels perhaps slightly imbalanced but no falls. No spinning or true dizziness. If looks in one direction than quickly to other sometimes disorienting. Not the best on fluid intake.   No facial or extremity weakness. No slurred words or trouble swallowing. no blurry vision or double vision. No paresthesias. No confusion or word finding difficulties.   A/P: Long-term issues with insomnia-I think trying trazodone would be reasonable.  Can use right before bed at 25 mg and if not effective could move to when he wakes up at night.  If that is not effective can try 50 mg. -Could also try cognitive behavioral therapy  Patient reports an uneasy almost anxious feeling as well at times-I wonder if that could be combination of poor sleep and poor hydration-we will try to address sleep to see if that is helpful. He agrees to try to increase fluid intake  #Elevated blood pressure reading S: medication: None Home readings #s: 138/89 BP Readings from Last 3 Encounters:  06/06/20 (!) 156/98  01/26/20 136/80  08/07/19 (!) 133/92  A/P: Home readings technically in okay range though I prefer less than 135/85.  I asked him to do some home monitoring and let us know-metoprolol once again would have dual benefit for him  #some bloating and patient with GERD history as well as hiatal hernia-  eating less but feels full/bloated. Trying to eat healthier in general. No nausea or vomiting. No constipation or diarrhea. No melena or dark black stool. Has not tried gas-x. Tums helps.  -already on dexilant and pepcid but may be missing some doses. Encouraged regular use and if persistent issues despite that to let us know.  Hiatal hernia could also be contributing.  Plus has follow up with Dr. Hilarie Fredrickson.  We discussed follow-up with me if failure to improve even with more consistent use or if has new or worsening symptoms  Recommended follow up: Return in about 4 months (around 10/06/2020) for physical or sooner if needed. No future appointments.  Lab/Order  associations:   ICD-10-CM   1. Insomnia, unspecified type  G47.00   2. Essential tremor  G25.0   3. Gastroesophageal reflux disease with esophagitis without hemorrhage  K21.00   4. Hiatal hernia  K44.9     Meds ordered this encounter  Medications   traZODone (DESYREL) 50 MG tablet    Sig: Take 0.5-1 tablets (25-50 mg total) by mouth at bedtime as needed for sleep.    Dispense:  30 tablet    Refill:  3   Return precautions advised.  Garret Reddish, MD

## 2020-06-04 ENCOUNTER — Ambulatory Visit: Payer: 59 | Admitting: Family Medicine

## 2020-06-06 ENCOUNTER — Other Ambulatory Visit: Payer: Self-pay

## 2020-06-06 ENCOUNTER — Encounter: Payer: Self-pay | Admitting: Family Medicine

## 2020-06-06 ENCOUNTER — Ambulatory Visit: Payer: 59 | Admitting: Family Medicine

## 2020-06-06 VITALS — BP 156/98 | HR 78 | Temp 97.8°F | Ht 65.0 in | Wt 167.0 lb

## 2020-06-06 DIAGNOSIS — K2100 Gastro-esophageal reflux disease with esophagitis, without bleeding: Secondary | ICD-10-CM

## 2020-06-06 DIAGNOSIS — K449 Diaphragmatic hernia without obstruction or gangrene: Secondary | ICD-10-CM

## 2020-06-06 DIAGNOSIS — G250 Essential tremor: Secondary | ICD-10-CM | POA: Insufficient documentation

## 2020-06-06 DIAGNOSIS — G4700 Insomnia, unspecified: Secondary | ICD-10-CM | POA: Diagnosis not present

## 2020-06-06 MED ORDER — TRAZODONE HCL 50 MG PO TABS: 25.0000 mg | ORAL_TABLET | Freq: Every evening | ORAL | 3 refills | Status: DC | PRN

## 2020-06-06 NOTE — Assessment & Plan Note (Signed)
#  Hand tremor S:over the last few months patient has noted with picking up objects he noted some tremulousness. Notes it if trying to hold finger stills the hand shakes- doesn't seem to be in the whole arm. worse with small objects.    Alcohol does seem to calm down the tremor but next day seems worse. Also has this more pronounced after using something that vibrates like weed whacker.   No resting tremors. No change to gait.  A/P: This certainly looks and sounds like essential tremor.  He does not want to add additional medicine at that time though we discussed options like metoprolol.

## 2020-06-19 ENCOUNTER — Encounter: Payer: Self-pay | Admitting: Family Medicine

## 2020-06-22 ENCOUNTER — Other Ambulatory Visit: Payer: Self-pay | Admitting: Family Medicine

## 2020-07-21 HISTORY — PX: SHOULDER SURGERY: SHX246

## 2020-09-16 ENCOUNTER — Other Ambulatory Visit: Payer: Self-pay | Admitting: Internal Medicine

## 2020-10-04 ENCOUNTER — Encounter: Payer: Self-pay | Admitting: Family Medicine

## 2020-10-07 ENCOUNTER — Other Ambulatory Visit: Payer: Self-pay

## 2020-10-07 MED ORDER — METOPROLOL SUCCINATE ER 25 MG PO TB24: 25.0000 mg | ORAL_TABLET | Freq: Every day | ORAL | 5 refills | Status: DC

## 2020-10-28 ENCOUNTER — Other Ambulatory Visit: Payer: Self-pay | Admitting: Internal Medicine

## 2020-10-30 ENCOUNTER — Telehealth: Payer: Self-pay | Admitting: Internal Medicine

## 2020-10-30 NOTE — Telephone Encounter (Signed)
Left vm to return call to schedule appt 

## 2020-10-30 NOTE — Telephone Encounter (Signed)
As noted to him at his previous refill request, he needs an appointment before further refills. Thank you    Disp Refills Start End  famotidine (PEPCID) 20 MG tablet [Pharmacy Med Name: FAMOTIDINE '20MG'$  TABLETS] 90 tablet 1 10/28/2020   Sig:  TAKE 1 TABLET(20 MG) BY MOUTH AT BEDTIME  Class:  Normal  DAW:  No  Reason for Refusal:  Patient needs an appointment  Refused By:  Larina Bras, CMA

## 2020-10-30 NOTE — Telephone Encounter (Signed)
Inbound call from patient requesting medication refill for famotidine.

## 2020-10-31 NOTE — Telephone Encounter (Signed)
Patient has been scheduled for 12/03/20.

## 2020-12-03 ENCOUNTER — Encounter: Payer: Self-pay | Admitting: Gastroenterology

## 2020-12-03 ENCOUNTER — Ambulatory Visit: Payer: 59 | Admitting: Gastroenterology

## 2020-12-03 VITALS — BP 148/82 | HR 80 | Ht 65.0 in | Wt 171.1 lb

## 2020-12-03 DIAGNOSIS — K2100 Gastro-esophageal reflux disease with esophagitis, without bleeding: Secondary | ICD-10-CM | POA: Diagnosis not present

## 2020-12-03 DIAGNOSIS — R053 Chronic cough: Secondary | ICD-10-CM

## 2020-12-03 MED ORDER — FAMOTIDINE 20 MG PO TABS
20.0000 mg | ORAL_TABLET | Freq: Two times a day (BID) | ORAL | 3 refills | Status: DC
Start: 2020-12-03 — End: 2021-03-07

## 2020-12-03 NOTE — Patient Instructions (Signed)
If you are age 63 or younger, your body mass index should be between 19-25. Your Body mass index is 28.48 kg/m. If this is out of the aformentioned range listed, please consider follow up with your Primary Care Provider.   RECOMMENDATIONS: Take Dexilant every day for a month, if feeling better then take it every other day.  MEDICATION: We have sent the following medication to your pharmacy for you to pick up at your convenience: Pepcid 20 MG tablet, take 1 twice a day.  It was great seeing you today! Thank you for entrusting me with your care and choosing [facility].  Alonza Bogus, PA-C  The [facility] providers would like to encourage you to use [facility] to communicate with providers for non-urgent requests or questions.  Due to long hold times on the telephone, sending your provider a message by [facility] may be faster and more efficient way to get a response. Please allow 48 business hours for a response.  Please remember that this is for non-urgent requests/questions.

## 2020-12-03 NOTE — Progress Notes (Addendum)
12/03/2020 Gregory Phillips [id number] 1955/11/16   HISTORY OF PRESENT ILLNESS:  This is a 65 year old male who is a patient of Dr. Vena Rua with a past medical history of GERD with esophagitis, chronic cough, adenomatous colon polyps, family history of colon cancer, osteoporosis who is here for follow-up.  He is requesting refills on his pepcid prescription.    He tells me that overall his reflux has been doing well.  He still has the coughing episodes, maybe a couple times per week, but it is much better than it used to be.  He tells me that he gets heartburn very rarely, less than once a week.  He says though that he ran out of Pepcid.  At his last visit he was instructed to take Dexilant 60 mg daily in the morning and then Pepcid in the evening or before sleep.  He says that he was trying to get off of the Dexilant because of the osteoporosis and bone density stuff so he stopped taking that and then he had run out of the Pepcid.  Then he started taking some over-the-counter lansoprazole that his wife had at home.  Sounds like his medication regimen has just not been ordered recently.  EGD 07/2019:  - Subtle mucosal changes in the mid and lower esophagus. Biopsied. - Z-line irregular, 32 cm from the incisors. Biopsied. - 3 cm hiatal hernia. - Gastritis. Biopsied. - Normal examined duodenum.   1. Surgical [P], gastric antrum and body - CHRONIC INACTIVE GASTRITIS WITH INTESTINAL METAPLASIA. - THERE IS NO EVIDENCE OF HELICOBACTER PYLORI, DYSPLASIA, OR MALIGNANCY. - SEE COMMENT. 2. Surgical [P], esophagus, irregular Z line - GASTROESOPHAGEAL JUNCTION MUCOSA WITH MILD INFLAMMATION CONSISTENT WITH GASTROESOPHAGEAL REFLUX. - THERE IS NO EVIDENCE OF GOBLET CELL METAPLASIA, DYSPLASIA, OR MALIGNANCY. - SEE COMMENT. 3. Surgical [P], distal and proximal esophagus - MINIMALLY INFLAMED SQUAMOUS MUCOSA. - THERE IS NO EVIDENCE OF SIGNIFICANT INCREASE IN EOSINOPHILS, DYSPLASIA, OR  MALIGNANCY.   Past Medical History:  Diagnosis Date   Adenomatous colon polyp    Allergy    Asthma    in past   Cellulitis 2001    LLE ; [facility]   Cellulitis 2007   R elbow ; S/ P drainage by Dr Telford Nab   Cellulitis 2014   with sepsis   Cellulitis 08/09/2014   LLE   Diverticulosis    Eczema    Edema    Gastritis with intestinal metaplasia of stomach 08/07/2019   GERD (gastroesophageal reflux disease)    Hiatal hernia    Internal hemorrhoids    Recurrent cellulitis of lower leg 08/15/2014   Septic thrombophlebitis 08/15/2014   Testicular cancer [facility] 2004   Dr Jeffie Pollock   Past Surgical History:  Procedure Laterality Date   COLONOSCOPY W/ POLYPECTOMY  05/14/2008 & 04/2012   Tubular adenoma; Dr. Verl Blalock   FOOT FRACTURE SURGERY Left 05/2012   FRACTURE SURGERY     I & D EXTREMITY Left 2004   elbow   KNEE ARTHROPLASTY Right     Dr Noemi Chapel,  for patellar instability   KNEE ARTHROSCOPY Right 1981    Dr Noemi Chapel   REFRACTIVE SURGERY Bilateral 2004   [place]  07/2020   Right shoulder   testicle removed Right    pt. not 100 percent sure   TUMOR EXCISION  2005    Seminoma; Dr Jeffie Pollock   VIDEO BRONCHOSCOPY N/A 11/03/2018   Procedure: VIDEO BRONCHOSCOPY WITHOUT FLUORO;  Surgeon: Lamonte Sakai,  Rose Fillers, MD;  Location: [facility] ENDOSCOPY;  Service: Cardiopulmonary;  Laterality: N/A;    reports that he has never smoked. He has never used smokeless tobacco. He reports current alcohol use of about 12.0 standard drinks per week. He reports that he does not use drugs. family history includes Asthma in his sister; Cancer in his paternal grandfather; Colon cancer in his father; Eczema in his maternal uncle; Heart disease in his father; Liver cancer in his father; Transient ischemic attack in his mother. No Known Allergies    Outpatient Encounter Medications as of 12/03/2020  Medication Sig   alendronate (FOSAMAX) 70 MG tablet Take by mouth.   allopurinol (ZYLOPRIM) 100 MG tablet  Take 100 mg by mouth daily.   augmented betamethasone dipropionate (DIPROLENE-AF) 0.05 % ointment APPLY TO THE AFFECTED AREA OF ECZEMA ON THE BODY AT BEDTIME   desonide (DESOWEN) 0.05 % ointment APPLY TO AFFECTED AREA OF ECZEMA ON FACE AT BEDTIME.   dexlansoprazole (DEXILANT) 60 MG capsule Take 1 capsule (60 mg total) by mouth in the morning.   DUPIXENT 300 MG/2ML SOSY    famotidine (PEPCID) 20 MG tablet Take 1 tablet (20 mg total) by mouth at bedtime.   metoprolol succinate (TOPROL-XL) 25 MG 24 hr tablet Take 1 tablet (25 mg total) by mouth daily.   testosterone cypionate (DEPOTESTOSTERONE CYPIONATE) 200 MG/ML injection    traZODone (DESYREL) 50 MG tablet Take 0.5-1 tablets (25-50 mg total) by mouth at bedtime as needed for sleep.   [DISCONTINUED] benzonatate (TESSALON) 200 MG capsule Take 1 capsule (200 mg total) by mouth 3 (three) times daily as needed for cough. Two to three times daily as needed (Patient not taking: Reported on 06/06/2020)   No facility-administered encounter medications on file as of 12/03/2020.    REVIEW OF SYSTEMS  : All other systems reviewed and negative except where noted in the History of Present Illness.   PHYSICAL EXAM: BP (!) 148/82   Pulse 80   Ht '5\' 5"'$  (1.651 m)   Wt 171 lb 2 oz (77.6 kg)   SpO2 96%   BMI 28.48 kg/m  General: Well developed white male in no acute distress Head: Normocephalic and atraumatic Eyes:  Sclerae anicteric, conjunctiva pink. Ears: Normal auditory acuity Lungs: Clear throughout to auscultation; no W/R/R. Heart: Regular rate and rhythm; no M/R/G. Abdomen: Soft, non-distended.  BS present.  Non-tender. Musculoskeletal: Symmetrical with no gross deformities  Skin: No lesions on visible extremities Extremities: Lymphedema noted in LLE Neurological: Alert oriented x 4, grossly non-focal Psychological:  Alert and cooperative. Normal mood and affect  ASSESSMENT AND PLAN: *GERD with chronic cough and globus sensation: Says that  overall his GERD has been ok, but he still has episodes of the coughing less than in the past but still a couple times per week.  His medication regimen has been disrupted, however.  He ran out of Pepcid and was not taking the Dexilant, but was taking some over-the-counter lansoprazole.  We discussed going back on the Dexilant regularly every morning for the next month or so and then also doing Pepcid in the evening as Dr. Hilarie Fredrickson had previously instructed.  Then if he is doing well maybe he can go to [place] every other day and on the days that he is not taking the Dexilant he can use the Pepcid a couple of times per day.  His Pepcid was refilled today at his request.  He was given enough refills for a year.  We will follow-up here  sooner than that if he continues to have ongoing issues.   CC:  Marin Olp, MD

## 2020-12-04 ENCOUNTER — Other Ambulatory Visit: Payer: Self-pay | Admitting: Family Medicine

## 2020-12-05 NOTE — Progress Notes (Signed)
Addendum: Reviewed and agree with assessment and management plan. Raejean Swinford M, MD  

## 2021-03-06 ENCOUNTER — Other Ambulatory Visit: Payer: Self-pay | Admitting: Internal Medicine

## 2021-03-07 ENCOUNTER — Encounter: Payer: Self-pay | Admitting: Family Medicine

## 2021-03-07 ENCOUNTER — Other Ambulatory Visit: Payer: Self-pay

## 2021-03-07 ENCOUNTER — Ambulatory Visit (INDEPENDENT_AMBULATORY_CARE_PROVIDER_SITE_OTHER): Payer: 59 | Admitting: Family Medicine

## 2021-03-07 VITALS — BP 144/98 | HR 75 | Temp 97.9°F | Ht 65.0 in | Wt 171.6 lb

## 2021-03-07 DIAGNOSIS — G250 Essential tremor: Secondary | ICD-10-CM | POA: Diagnosis not present

## 2021-03-07 DIAGNOSIS — R42 Dizziness and giddiness: Secondary | ICD-10-CM

## 2021-03-07 DIAGNOSIS — I10 Essential (primary) hypertension: Secondary | ICD-10-CM | POA: Diagnosis not present

## 2021-03-07 DIAGNOSIS — F1090 Alcohol use, unspecified, uncomplicated: Secondary | ICD-10-CM | POA: Diagnosis not present

## 2021-03-07 DIAGNOSIS — R519 Headache, unspecified: Secondary | ICD-10-CM

## 2021-03-07 LAB — COMPREHENSIVE METABOLIC PANEL
ALT: 45 U/L (ref 0–53)
AST: 33 U/L (ref 0–37)
Albumin: 4.1 g/dL (ref 3.5–5.2)
Alkaline Phosphatase: 59 U/L (ref 39–117)
BUN: 12 mg/dL (ref 6–23)
CO2: 28 mEq/L (ref 19–32)
Calcium: 9.5 mg/dL (ref 8.4–10.5)
Chloride: 98 mEq/L (ref 96–112)
Creatinine, Ser: 0.8 mg/dL (ref 0.40–1.50)
GFR: 94.29 mL/min (ref >60.00)
Glucose, Bld: 92 mg/dL (ref 70–99)
Potassium: 4 mEq/L (ref 3.5–5.1)
Sodium: 138 mEq/L (ref 135–145)
Total Bilirubin: 1.6 mg/dL — ABNORMAL HIGH (ref 0.2–1.2)
Total Protein: 7.1 g/dL (ref 6.0–8.3)

## 2021-03-07 LAB — CBC WITH DIFFERENTIAL/PLATELET
Basophils Absolute: 0 10*3/uL (ref 0.0–0.1)
Basophils Relative: 0.6 % (ref 0.0–3.0)
Eosinophils Absolute: 0.4 10*3/uL (ref 0.0–0.7)
Eosinophils Relative: 4.9 % (ref 0.0–5.0)
HCT: 45 % (ref 39.0–52.0)
Hemoglobin: 14.9 g/dL (ref 13.0–17.0)
Lymphocytes Relative: 10.9 % — ABNORMAL LOW (ref 12.0–46.0)
Lymphs Abs: 0.9 10*3/uL (ref 0.7–4.0)
MCHC: 33.2 g/dL (ref 30.0–36.0)
MCV: 92.8 fl (ref 78.0–100.0)
Monocytes Absolute: 0.9 10*3/uL (ref 0.1–1.0)
Monocytes Relative: 11.2 % (ref 3.0–12.0)
Neutro Abs: 5.7 10*3/uL (ref 1.4–7.7)
Neutrophils Relative %: 72.4 % (ref 43.0–77.0)
Platelets: 206 10*3/uL (ref 150.0–400.0)
RBC: 4.85 Mil/uL (ref 4.22–5.81)
RDW: 14 % (ref 11.5–15.5)
WBC: 7.8 10*3/uL (ref 4.0–10.5)

## 2021-03-07 LAB — B12 AND FOLATE PANEL
Folate: 20.4 ng/mL (ref >5.9)
Vitamin B-12: 521 pg/mL (ref 211–911)

## 2021-03-07 MED ORDER — METOPROLOL SUCCINATE ER 50 MG PO TB24: 50.0000 mg | ORAL_TABLET | Freq: Every day | ORAL | 0 refills | Status: DC

## 2021-03-07 MED ORDER — METOPROLOL SUCCINATE ER 25 MG PO TB24: 25.0000 mg | ORAL_TABLET | Freq: Every day | ORAL | 5 refills | Status: DC

## 2021-03-07 NOTE — Patient Instructions (Signed)
Please return in 4 weeks to see Dr. Yong Channel for recheck.   I have ordered a larger dose of the metoprolol XL to help lower your blood pressure.  Monitor your symptoms to see if lowering your blood pressure helps.  IF you have a severe headache, or weakness or severe balance problems, seek care in the emergency room.   I will release your lab results to you on your MyChart account with further instructions. Please reply with any questions.    If you have any questions or concerns, please don't hesitate to send me a message via MyChart or call the office at [phone]. Thank you for visiting with Gregory Phillips today! It's our pleasure caring for you.   Hypertension, Adult High blood pressure (hypertension) is when the force of blood pumping through the arteries is too strong. The arteries are the blood vessels that carry blood from the heart throughout the body. Hypertension forces the heart to work harder to pump blood and may cause arteries to become narrow or stiff. Untreated or uncontrolled hypertension can cause a heart attack, heart failure, a stroke, kidney disease, and other problems. A blood pressure reading consists of a higher number over a lower number. Ideally, your blood pressure should be below 120/80. The first ("top") number is called the systolic pressure. It is a measure of the pressure in your arteries as your heart beats. The second ("bottom") number is called the diastolic pressure. It is a measure of the pressure in your arteries as the heart relaxes. What are the causes? The exact cause of this condition is not known. There are some conditions that result in or are related to high blood pressure. What increases the risk? Some risk factors for high blood pressure are under your control. The following factors may make you more likely to develop this condition: Smoking. Having type 2 diabetes mellitus, high cholesterol, or both. Not getting enough exercise or physical activity. Being  overweight. Having too much fat, sugar, calories, or salt (sodium) in your diet. Drinking too much alcohol. Some risk factors for high blood pressure may be difficult or impossible to change. Some of these factors include: Having chronic kidney disease. Having a family history of high blood pressure. Age. Risk increases with age. Race. You may be at higher risk if you are African American. Gender. Men are at higher risk than women before age 4. After age 63, women are at higher risk than men. Having obstructive sleep apnea. Stress. What are the signs or symptoms? High blood pressure may not cause symptoms. Very high blood pressure (hypertensive crisis) may cause: Headache. Anxiety. Shortness of breath. Nosebleed. Nausea and vomiting. Vision changes. Severe chest pain. Seizures. How is this diagnosed? This condition is diagnosed by measuring your blood pressure while you are seated, with your arm resting on a flat surface, your legs uncrossed, and your feet flat on the floor. The cuff of the blood pressure monitor will be placed directly against the skin of your upper arm at the level of your heart. It should be measured at least twice using the same arm. Certain conditions can cause a difference in blood pressure between your right and left arms. Certain factors can cause blood pressure readings to be lower or higher than normal for a short period of time: When your blood pressure is higher when you are in a health care provider's office than when you are at home, this is called white coat hypertension. Most people with this condition do not  need medicines. When your blood pressure is higher at home than when you are in a health care provider's office, this is called masked hypertension. Most people with this condition may need medicines to control blood pressure. If you have a high blood pressure reading during one visit or you have normal blood pressure with other risk factors, you may  be asked to: Return on a different day to have your blood pressure checked again. Monitor your blood pressure at home for 1 week or longer. If you are diagnosed with hypertension, you may have other blood or imaging tests to help your health care provider understand your overall risk for other conditions. How is this treated? This condition is treated by making healthy lifestyle changes, such as eating healthy foods, exercising more, and reducing your alcohol intake. Your health care provider may prescribe medicine if lifestyle changes are not enough to get your blood pressure under control, and if: Your systolic blood pressure is above 130. Your diastolic blood pressure is above 80. Your personal target blood pressure may vary depending on your medical conditions, your age, and other factors. Follow these instructions at home: Eating and drinking  Eat a diet that is high in fiber and potassium, and low in sodium, added sugar, and fat. An example eating plan is called the DASH (Dietary Approaches to Stop Hypertension) diet. To eat this way: Eat plenty of fresh fruits and vegetables. Try to fill one half of your plate at each meal with fruits and vegetables. Eat whole grains, such as whole-wheat pasta, brown rice, or whole-grain bread. Fill about one fourth of your plate with whole grains. Eat or drink low-fat dairy products, such as skim milk or low-fat yogurt. Avoid fatty cuts of meat, processed or cured meats, and poultry with skin. Fill about one fourth of your plate with lean proteins, such as fish, chicken without skin, beans, eggs, or tofu. Avoid pre-made and processed foods. These tend to be higher in sodium, added sugar, and fat. Reduce your daily sodium intake. Most people with hypertension should eat less than 1,500 mg of sodium a day. Do not drink alcohol if: Your health care provider tells you not to drink. You are pregnant, may be pregnant, or are planning to become pregnant. If  you drink alcohol: Limit how much you use to: 0-1 drink a day for women. 0-2 drinks a day for men. Be aware of how much alcohol is in your drink. In the [place] one drink equals one 12 oz bottle of beer (355 mL), one 5 oz glass of wine (148 mL), or one 1 oz glass of hard liquor (44 mL). Lifestyle  Work with your health care provider to maintain a healthy body weight or to lose weight. Ask what an ideal weight is for you. Get at least 30 minutes of exercise most days of the week. Activities may include walking, swimming, or biking. Include exercise to strengthen your muscles (resistance exercise), such as Pilates or lifting weights, as part of your weekly exercise routine. Try to do these types of exercises for 30 minutes at least 3 days a week. Do not use any products that contain nicotine or tobacco, such as cigarettes, e-cigarettes, and chewing tobacco. If you need help quitting, ask your health care provider. Monitor your blood pressure at home as told by your health care provider. Keep all follow-up visits as told by your health care provider. This is important. Medicines Take over-the-counter and prescription medicines only as told by your  health care provider. Follow directions carefully. Blood pressure medicines must be taken as prescribed. Do not skip doses of blood pressure medicine. Doing this puts you at risk for problems and can make the medicine less effective. Ask your health care provider about side effects or reactions to medicines that you should watch for. Contact a health care provider if you: Think you are having a reaction to a medicine you are taking. Have headaches that keep coming back (recurring). Feel dizzy. Have swelling in your ankles. Have trouble with your vision. Get help right away if you: Develop a severe headache or confusion. Have unusual weakness or numbness. Feel faint. Have severe pain in your chest or abdomen. Vomit repeatedly. Have trouble  breathing. Summary Hypertension is when the force of blood pumping through your arteries is too strong. If this condition is not controlled, it may put you at risk for serious complications. Your personal target blood pressure may vary depending on your medical conditions, your age, and other factors. For most people, a normal blood pressure is less than 120/80. Hypertension is treated with lifestyle changes, medicines, or a combination of both. Lifestyle changes include losing weight, eating a healthy, low-sodium diet, exercising more, and limiting alcohol. This information is not intended to replace advice given to you by your health care provider. Make sure you discuss any questions you have with your health care provider. Document Revised: 11/17/2017 Document Reviewed: 11/17/2017 Elsevier Patient Education  [employer].

## 2021-03-07 NOTE — Progress Notes (Signed)
Subjective  CC:  Chief Complaint  Patient presents with   Hypertension   Dizziness    Unsteady, head pressure. Ongoing for a few months   Same day acute visit; PCP not available. New pt to me. Chart reviewed.   HPI: Gregory Phillips. is a 65 y.o. male who presents to the office today to address the problems listed above in the chief complaint. 65 year old male with history of essential tremor and elevated blood pressure readings who was last here in the office in March.  In July he sent a note to his PCP who started metoprolol XL 25 mg for both tremor and elevated blood pressure.  Patient was to follow-up in 1 month but did not make that appointment.  He does have documented elevated blood pressure readings at his endocrinology office since then.  He takes metoprolol but not every day.  He does feel it is not helping his tremor.  However, he is here today due to vague sensations described as increased pressure on the top of his head, changes in perceptions described as "feeling like he will miss a step when walking or feeling like things are passing him too quickly or while driving, feeling like other cars are coming into his lane".  He specifically denies headaches, double or blurred vision, dysarthria, hemiparesis, ataxia, unsteady gait, falls, or paresthesias.  He reports his symptoms have been ongoing for the last 2 months.  He denies stressors or anxieties.  No history of mood disorder.  He does endorse daily drinking since a young adult.  He reports he drinks 3-4 alcoholic beverages per day.  He denies dependence or addiction problems.  No weakness noted.  He is concerned about a possible brain tumor.  He denies vertigo or lightheadedness.  He has had no syncope.  He denies chest pain, palpitations. I reviewed recent lab work from endocrinology.  Normal thyroid function.  Normal CBC  I spent a total of 52 minutes for this patient encounter. Time spent included preparation, face-to-face  counseling with the patient and coordination of care, review of chart and records, and documentation of the encounter.  Assessment  1. Dysequilibrium   2. Pressure in head   3. Essential hypertension   4. Essential tremor   5. Habitual alcohol use      Plan  Disequilibrium: Unclear etiology.  Intact neurologic exam today.  Possibly related to hypertension.  Differential is broad, could related to vitamin deficiencies, undiagnosed neuropathies, central pathologies etc.  Discussed and counseled at length.  Today's plan: Treat hypertension by increasing metoprolol XL to 50 mg daily.  Recommend follow-up with PCP in 1 month's time.  Reassess symptoms then.  If persistent, then consider other etiologies.  Check lab work today.  Did discuss possibility of cerebellar dysfunction due to chronic alcohol use.  Neuropathies etc.  If symptoms worsen or include red flag symptoms including severe headaches, hemiparesis, ataxia he will seek care in the emergency room. Recommend decreasing alcohol intake Continue beta-blocker for essential tremor which is improving  Follow up: 4 weeks with PCP Visit date not found  Orders Placed This Encounter  Procedures   CBC with Differential/Platelet   Comprehensive metabolic panel   K24 and Folate Panel   Meds ordered this encounter  Medications   DISCONTD: metoprolol succinate (TOPROL-XL) 25 MG 24 hr tablet    Sig: Take 1 tablet (25 mg total) by mouth daily.    Dispense:  30 tablet    Refill:  5  metoprolol succinate (TOPROL-XL) 50 MG 24 hr tablet    Sig: Take 1 tablet (50 mg total) by mouth daily.    Dispense:  90 tablet    Refill:  0      I reviewed the patients updated PMH, FH, and SocHx.    Patient Active Problem List   Diagnosis Date Noted   Essential hypertension 03/07/2021   Habitual alcohol use 03/07/2021   Insomnia 06/06/2020   Essential tremor 06/06/2020   Hiatal hernia 06/06/2020   Gout 11/24/2018   Hypogonadism male 11/09/2018    Allergic reaction 11/09/2018   Chronic cough 09/16/2018   Metatarsal stress fracture of right foot 08/05/2017   Osteoporosis 06/29/2017   Erectile dysfunction 12/09/2015   GERD (gastroesophageal reflux disease) 12/09/2015   Hair loss 08/23/2014   Septic thrombophlebitis 08/15/2014   Recurrent cellulitis of lower leg 08/15/2014   Lymphedema    Other abnormal glucose 08/13/2013   Eczema 06/29/2012   Asthma 03/28/2009   History of adenomatous polyp of colon 03/28/2009   Seminoma of descended right testis ([place]) 03/21/2007   HYPERLIPIDEMIA 03/21/2007   Atopic eczema 03/21/2007   Current Meds  Medication Sig   alendronate (FOSAMAX) 70 MG tablet Take by mouth.   allopurinol (ZYLOPRIM) 100 MG tablet Take 100 mg by mouth daily.   augmented betamethasone dipropionate (DIPROLENE-AF) 0.05 % ointment APPLY TO THE AFFECTED AREA OF ECZEMA ON THE BODY AT BEDTIME   desonide (DESOWEN) 0.05 % ointment APPLY TO AFFECTED AREA OF ECZEMA ON FACE AT BEDTIME.   dexlansoprazole (DEXILANT) 60 MG capsule TAKE 1 CAPSULE(60 MG) BY MOUTH IN THE MORNING   DUPIXENT 300 MG/2ML SOSY    testosterone cypionate (DEPOTESTOSTERONE CYPIONATE) 200 MG/ML injection    [DISCONTINUED] metoprolol succinate (TOPROL-XL) 25 MG 24 hr tablet Take 1 tablet (25 mg total) by mouth daily.    Allergies: Patient has No Known Allergies. Family History: Patient family history includes Asthma in his sister; Cancer in his paternal grandfather; Colon cancer in his father; Eczema in his maternal uncle; Heart disease in his father; Liver cancer in his father; Transient ischemic attack in his mother. Social History:  Patient  reports that he has never smoked. He has never used smokeless tobacco. He reports current alcohol use of about 12.0 standard drinks per week. He reports that he does not use drugs.  Review of Systems: Constitutional: Negative for fever malaise or anorexia Cardiovascular: negative for chest pain Respiratory: negative for  SOB or persistent cough Gastrointestinal: negative for abdominal pain  Objective  Vitals: BP (!) 144/98    Pulse 75    Temp 97.9 F (36.6 C) (Temporal)    Ht 5\' 5"  (1.651 m)    Wt 171 lb 9.6 oz (77.8 kg)    SpO2 97%    BMI 28.56 kg/m  General: no acute distress , A&Ox3 HEENT: PEERL, conjunctiva normal, neck is supple, normal TMs Cardiovascular:  RRR without murmur or gallop.  Respiratory:  Good breath sounds bilaterally, CTAB with normal respiratory effort Skin:  Warm, no rashes Neuro: Cranial nerves II through XII intact, no nystagmus, DTRs +2 bilaterally, normal gait.  Normal tandem walk.  Negative Romberg.  No pronator drift, normal finger-to-nose with slight intention tremor, normal heel-to-shin. Strength is grossly 5 out of 5 bilaterally, no vertigo precipitated with head movement.   Commons side effects, risks, benefits, and alternatives for medications and treatment plan prescribed today were discussed, and the patient expressed understanding of the given instructions. Patient is instructed to call or  message via MyChart if he/she has any questions or concerns regarding our treatment plan. No barriers to understanding were identified. We discussed Red Flag symptoms and signs in detail. Patient expressed understanding regarding what to do in case of urgent or emergency type symptoms.  Medication list was reconciled, printed and provided to the patient in AVS. Patient instructions and summary information was reviewed with the patient as documented in the AVS. This note was prepared with assistance of Dragon voice recognition software. Occasional wrong-word or sound-a-like substitutions may have occurred due to the inherent limitations of voice recognition software  This visit occurred during the SARS-CoV-2 public health emergency.  Safety protocols were in place, including screening questions prior to the visit, additional usage of staff PPE, and extensive cleaning of exam room while  observing appropriate contact time as indicated for disinfecting solutions.

## 2021-04-17 ENCOUNTER — Encounter: Payer: Self-pay | Admitting: Family Medicine

## 2021-04-17 ENCOUNTER — Ambulatory Visit: Payer: 59 | Admitting: Family Medicine

## 2021-04-17 ENCOUNTER — Other Ambulatory Visit: Payer: Self-pay

## 2021-04-17 VITALS — BP 138/80 | HR 64 | Temp 97.4°F | Wt 169.4 lb

## 2021-04-17 DIAGNOSIS — Z79899 Other long term (current) drug therapy: Secondary | ICD-10-CM | POA: Diagnosis not present

## 2021-04-17 DIAGNOSIS — M109 Gout, unspecified: Secondary | ICD-10-CM

## 2021-04-17 DIAGNOSIS — Z125 Encounter for screening for malignant neoplasm of prostate: Secondary | ICD-10-CM | POA: Diagnosis not present

## 2021-04-17 DIAGNOSIS — G4452 New daily persistent headache (NDPH): Secondary | ICD-10-CM

## 2021-04-17 DIAGNOSIS — I10 Essential (primary) hypertension: Secondary | ICD-10-CM | POA: Diagnosis not present

## 2021-04-17 DIAGNOSIS — E782 Mixed hyperlipidemia: Secondary | ICD-10-CM

## 2021-04-17 DIAGNOSIS — R42 Dizziness and giddiness: Secondary | ICD-10-CM

## 2021-04-17 MED ORDER — ALBUTEROL SULFATE HFA 108 (90 BASE) MCG/ACT IN AERS: 2.0000 | INHALATION_SPRAY | Freq: Four times a day (QID) | RESPIRATORY_TRACT | 2 refills | Status: DC | PRN

## 2021-04-17 NOTE — Progress Notes (Signed)
Phone [phone] In person visit   Subjective:   Gregory Phillips. is a 66 y.o. year old very pleasant male patient who presents for/with See problem oriented charting Chief Complaint  Patient presents with   Hypertension    Imbalanced gait has stayed the same since last visit, headache is worse being more constant    This visit occurred during the SARS-CoV-2 public health emergency.  Safety protocols were in place, including screening questions prior to the visit, additional usage of staff PPE, and extensive cleaning of exam room while observing appropriate contact time as indicated for disinfecting solutions.   Past Medical History-  Patient Active Problem List   Diagnosis Date Noted   Hypogonadism male 11/09/2018    Priority: High   Osteoporosis 06/29/2017    Priority: High   Recurrent cellulitis of lower leg 08/15/2014    Priority: High   Atopic eczema 03/21/2007    Priority: High   Essential hypertension 03/07/2021    Priority: Medium    Insomnia 06/06/2020    Priority: Medium    Essential tremor 06/06/2020    Priority: Medium    Hiatal hernia 06/06/2020    Priority: Medium    Gout 11/24/2018    Priority: Medium    Allergic reaction 11/09/2018    Priority: Medium    Chronic cough 09/16/2018    Priority: Medium    Lymphedema     Priority: Medium    Other abnormal glucose 08/13/2013    Priority: Medium    Asthma 03/28/2009    Priority: Medium    History of testicular mass- Seminoma of descended right testis 03/21/2007    Priority: Medium    HYPERLIPIDEMIA 03/21/2007    Priority: Medium    Metatarsal stress fracture of right foot 08/05/2017    Priority: Low   Erectile dysfunction 12/09/2015    Priority: Low   GERD (gastroesophageal reflux disease) 12/09/2015    Priority: Low   Hair loss 08/23/2014    Priority: Low   Septic thrombophlebitis 08/15/2014    Priority: Low   History of adenomatous polyp of colon 03/28/2009    Priority: Low   Habitual  alcohol use 03/07/2021   Eczema 06/29/2012    Medications- reviewed and updated Current Outpatient Medications  Medication Sig Dispense Refill   albuterol (VENTOLIN HFA) 108 (90 Base) MCG/ACT inhaler Inhale 2 puffs into the lungs every 6 (six) hours as needed for wheezing or shortness of breath. 1 each 2   alendronate (FOSAMAX) 70 MG tablet Take by mouth.     allopurinol (ZYLOPRIM) 100 MG tablet Take 100 mg by mouth daily.     augmented betamethasone dipropionate (DIPROLENE-AF) 0.05 % ointment APPLY TO THE AFFECTED AREA OF ECZEMA ON THE BODY AT BEDTIME 50 g 1   desonide (DESOWEN) 0.05 % ointment APPLY TO AFFECTED AREA OF ECZEMA ON FACE AT BEDTIME. 60 g 2   dexlansoprazole (DEXILANT) 60 MG capsule TAKE 1 CAPSULE(60 MG) BY MOUTH IN THE MORNING 90 capsule 2   DUPIXENT 300 MG/2ML SOSY      metoprolol succinate (TOPROL-XL) 50 MG 24 hr tablet Take 1 tablet (50 mg total) by mouth daily. 90 tablet 0   testosterone cypionate (DEPOTESTOSTERONE CYPIONATE) 200 MG/ML injection      No current facility-administered medications for this visit.     Objective:  BP 138/80 (BP Location: Left Arm, Patient Position: Sitting)    Pulse 64    Temp (!) 97.4 F (36.3 C) (Temporal)    Wt 169  lb 6.4 oz (76.8 kg)    SpO2 95%    BMI 28.19 kg/m  Gen: NAD, resting comfortably CV: RRR no murmurs rubs or gallops Lungs: CTAB no crackles, wheeze, rhonchi Abdomen: soft/nontender/nondistended/normal bowel sounds. No rebound or guarding.  Ext: no edema Skin: warm, dry Neuro: CN II-XII intact, sensation and reflexes normal throughout, 5/5 muscle strength in bilateral upper and lower extremities. Normal finger to nose. Normal rapid alternating movements. No pronator drift. Normal romberg. Normal gait.      Assessment and Plan   #social update- daughter getting married at [place] in later this year  # Dyspnea/chronic cough S:has had long term cough.  Did become wet within last 2 weeks with yellowish sputum. Slightly  better in last 2 days.   Asthma diagnosis in childhood. Resolved by 42. Did pft testing and reassuring without clear obstruction but was hyperinflated with elevated DLCO- suggestive but not diagnostic for asthma. He thought mainly upper airway issue with pulmonary Dr. Lamonte Sakai on 04/18/19. He did not think he needed bronchodilator scheduled but just as needed albuterol.   Also treated for GERD and has seen GI.  A/P: ongoing issues- discussed return to GI or pulmonary. Trial albuterol- has not tried yet.   - may try prednisone- see next section  # dysequilibrium/ headache.  S:has noted exertional component to this since last visit- when he has tried to push his pace with walking gets more uneasy- feels more lightheaded/disoriented. Headaches do not increase with exertion.  Symptoms did not improve with better blood pressure control.  Does feel like he is moving at times even when not but a spinning sensation. There was some concern of cerebellar dysfunction with alcohol intake - he has been cutting back. Worse with certain environments if at edge of water or on a balcony. Does not occur with head movement or rolling over in bed.   Also still notes top of head chronic pressure 4/10 (increased since last visit- previously just felt like a pressure) Nothing makes it better or worse. Started around October- was more off and on at first but over last few weeks persistent. Motrin helps some with headache but doesn't . No history of headaches.   Looking back in 2017 did have similar but he reports symptoms resolved at that time. Was not having dysequilibrium at that time. Feels like headache similar in strength A/P: Several months now of disequilibrium/vertigo along with new onset headaches-we opted to get MRI to further evaluate.  Can try meclizine for the vertigo type symptoms.  If no clear answers on MRI we discussed neurology referral as the next step..  -did consider prednisone for cough symptoms/possible  asthma but wanted to hold off until after MRI  #hypertension S: medication: Metoprolol 50 mg extended release Home readings #s: typically high 120s or low 130s at home- rarely into 140s. Usually around 80 on the diastolic reading.  Exercise-walking about twice a week  Alcohol  down from 3-4 to 2-3- good prgoress but encourged further reduction BP Readings from Last 3 Encounters:  04/17/21 138/80  03/07/21 (!) 144/98  12/03/20 (!) 148/82  A/P: Controlled/improved. Continue current medications.   #hyperlipidemia S: Medication:none  Lab Results  Component Value Date   CHOL 199 11/09/2018   HDL 54.70 11/09/2018   LDLCALC 122 (H) 11/09/2018   LDLDIRECT 148.1 07/22/2012   TRIG 109.0 11/09/2018   CHOLHDL 4 11/09/2018   A/P:  mild poor control. 10-year ASCVD risk score over 13%-we discussed doing CT cardiac scoring  as long as lipids are still approximately in this range-he is agreeable and this was ordered today.  # GERD S:Medication: dexilant 60mg  - rare reflux on this once every 2 weeks- certain foods still trigger like steaks, prime rib, bbq A/P: overall pretty good control- continue current meds.   - advised against motrin as can affect reflux  #Gout S:  prefers mitigare as needed. Not on allopurinol. No flare ups recently Lab Results  Component Value Date   LABURIC 8.3 (H) 11/09/2018  A/P:uric acid has been high but controlled with one or two doses of mitigare- he prefers to remain off meds- will get updated baseline uric acid.    # Osteoporosis- related to low testosterone S: Last DEXA: reports 15% improvement this year Medication (bisphosphonate or prolia): fosamax 70 mg per Dr. Tamala Julian  Calcium: 1200mg  (through diet ok) recommended  Vitamin D: 1000 units a day recommended A/P: has bene improving -continue follow up with endocrine Dr. Tamala Julian   #Eczema-patient on dupixent through Dr. Donneta Romberg with allergist .  We have also prescribed Diprolene for his body and desonide for his  face  #Hypotestosteronism-patient on testosterone through Dr. Lolly Mustache [facility] -Last PSA drawn 03/20/2020 by [facility] was 1.02 Lab Results  Component Value Date   PSA 0.68 03/03/2016   PSA 0.53 09/03/2014   PSA 0.50 07/22/2012   Recommended follow up: Return in about 6 months (around 10/15/2021) for physical or sooner if needed.  Lab/Order associations:   ICD-10-CM   1. Essential hypertension  I10     2. HYPERLIPIDEMIA  E78.2 CBC with Differential/Platelet    Comprehensive metabolic panel    Lipid panel    CT CARDIAC SCORING (SELF PAY ONLY)    3. Screening for prostate cancer  Z12.5 PSA    4. High risk medication use  Z79.899     5. Gout, unspecified cause, unspecified chronicity, unspecified site  M10.9 Uric acid    6. Vertigo  R42 MR Brain Wo Contrast    7. Headache, new daily persistent (ND[facility])  G44.52 MR Brain Wo Contrast     Time Spent: 42 minutes of total time (3:50 PM- 4:32 PM) was spent on the date of the encounter performing the following actions: chart review prior to seeing the patient, obtaining history, performing a medically necessary exam, counseling on the treatment plan, placing orders, and documenting in our EHR.    Meds ordered this encounter  Medications   albuterol (VENTOLIN HFA) 108 (90 Base) MCG/ACT inhaler    Sig: Inhale 2 puffs into the lungs every 6 (six) hours as needed for wheezing or shortness of breath.    Dispense:  1 each    Refill:  2    Return precautions advised.  Garret Reddish, MD

## 2021-04-17 NOTE — Patient Instructions (Addendum)
Health Maintenance Due  Topic Date Due   Zoster Vaccines- Shingrix (1 of 2) Never done   COVID-19 Vaccine (4 - Booster for Pfizer series) 03/26/2020   INFLUENZA VACCINE  Never done   We will call you within two weeks about your referral to ct cardiac scoring and MR brain. If you do not hear within 2 weeks, give [place] a call.   Consider b complex vitamin daily with alcohol use. Ideally cut down to 2 a day max for liver health or cut out completely.   Trial albuterol for cough- can return to GI or pulmonary as next steps if not improving.   Calcium: 1200mg  (through diet ok) recommended  Vitamin D: 1000 units a day recommended  Schedule a lab visit at the check out desk within 2 weeks. Return for future fasting labs meaning nothing but water after midnight please. Ok to take your medications with water.   Can try meclizine over the counter for imbalance issues   Recommended follow up: Return in about 6 months (around 10/15/2021) for physical or sooner if needed. Or certainly sooner if we do not find a clear cause on mri and persistent symptoms- consider neurology referral as next step

## 2021-04-22 ENCOUNTER — Encounter: Payer: Self-pay | Admitting: Family Medicine

## 2021-04-22 ENCOUNTER — Other Ambulatory Visit: Payer: Self-pay | Admitting: Family Medicine

## 2021-04-22 ENCOUNTER — Other Ambulatory Visit (INDEPENDENT_AMBULATORY_CARE_PROVIDER_SITE_OTHER): Payer: 59

## 2021-04-22 ENCOUNTER — Other Ambulatory Visit: Payer: Self-pay

## 2021-04-22 DIAGNOSIS — Z125 Encounter for screening for malignant neoplasm of prostate: Secondary | ICD-10-CM | POA: Diagnosis not present

## 2021-04-22 DIAGNOSIS — M109 Gout, unspecified: Secondary | ICD-10-CM

## 2021-04-22 DIAGNOSIS — E782 Mixed hyperlipidemia: Secondary | ICD-10-CM | POA: Diagnosis not present

## 2021-04-22 LAB — COMPREHENSIVE METABOLIC PANEL
ALT: 43 U/L (ref 0–53)
AST: 35 U/L (ref 0–37)
Albumin: 4.2 g/dL (ref 3.5–5.2)
Alkaline Phosphatase: 55 U/L (ref 39–117)
BUN: 11 mg/dL (ref 6–23)
CO2: 29 mEq/L (ref 19–32)
Calcium: 9.5 mg/dL (ref 8.4–10.5)
Chloride: 100 mEq/L (ref 96–112)
Creatinine, Ser: 0.79 mg/dL (ref 0.40–1.50)
GFR: 94.56 mL/min (ref >60.00)
Glucose, Bld: 114 mg/dL — ABNORMAL HIGH (ref 70–99)
Potassium: 4.2 mEq/L (ref 3.5–5.1)
Sodium: 138 mEq/L (ref 135–145)
Total Bilirubin: 1 mg/dL (ref 0.2–1.2)
Total Protein: 7.1 g/dL (ref 6.0–8.3)

## 2021-04-22 LAB — CBC WITH DIFFERENTIAL/PLATELET
Basophils Absolute: 0.1 10*3/uL (ref 0.0–0.1)
Basophils Relative: 1 % (ref 0.0–3.0)
Eosinophils Absolute: 0.4 10*3/uL (ref 0.0–0.7)
Eosinophils Relative: 6 % — ABNORMAL HIGH (ref 0.0–5.0)
HCT: 45 % (ref 39.0–52.0)
Hemoglobin: 15.1 g/dL (ref 13.0–17.0)
Lymphocytes Relative: 13.7 % (ref 12.0–46.0)
Lymphs Abs: 1 10*3/uL (ref 0.7–4.0)
MCHC: 33.6 g/dL (ref 30.0–36.0)
MCV: 91.5 fl (ref 78.0–100.0)
Monocytes Absolute: 0.8 10*3/uL (ref 0.1–1.0)
Monocytes Relative: 10.5 % (ref 3.0–12.0)
Neutro Abs: 5 10*3/uL (ref 1.4–7.7)
Neutrophils Relative %: 68.8 % (ref 43.0–77.0)
Platelets: 217 10*3/uL (ref 150.0–400.0)
RBC: 4.92 Mil/uL (ref 4.22–5.81)
RDW: 14 % (ref 11.5–15.5)
WBC: 7.2 10*3/uL (ref 4.0–10.5)

## 2021-04-22 LAB — LIPID PANEL
Cholesterol: 221 mg/dL — ABNORMAL HIGH (ref 0–200)
HDL: 70.6 mg/dL (ref >39.00)
LDL Cholesterol: 137 mg/dL — ABNORMAL HIGH (ref 0–99)
NonHDL: 150.48
Total CHOL/HDL Ratio: 3
Triglycerides: 68 mg/dL (ref 0.0–149.0)
VLDL: 13.6 mg/dL (ref 0.0–40.0)

## 2021-04-22 LAB — PSA: PSA: 1.04 ng/mL (ref 0.10–4.00)

## 2021-04-22 LAB — URIC ACID: Uric Acid, Serum: 7.5 mg/dL (ref 4.0–7.8)

## 2021-04-22 MED ORDER — MECLIZINE HCL 25 MG PO TABS: 25.0000 mg | ORAL_TABLET | Freq: Three times a day (TID) | ORAL | 0 refills | Status: DC | PRN

## 2021-04-25 ENCOUNTER — Telehealth: Payer: Self-pay | Admitting: Family Medicine

## 2021-04-25 NOTE — Telephone Encounter (Signed)
Patient wants a call back from referral coordinator LIsa today. Pt states that he needs his MRI scheduled at an in network location. Pt also needs a CT scheduled. Please call at [phone]. [facility].

## 2021-04-28 NOTE — Telephone Encounter (Signed)
Message has been forwarded to [facility].

## 2021-05-08 ENCOUNTER — Ambulatory Visit
Admission: RE | Admit: 2021-05-08 | Discharge: 2021-05-08 | Disposition: A | Discharge status: Home / Self Care | Payer: 59 | Source: Ambulatory Visit | Attending: Family Medicine | Admitting: Family Medicine

## 2021-05-08 ENCOUNTER — Other Ambulatory Visit: Payer: Self-pay

## 2021-05-08 DIAGNOSIS — R42 Dizziness and giddiness: Secondary | ICD-10-CM

## 2021-05-08 DIAGNOSIS — G4452 New daily persistent headache (NDPH): Secondary | ICD-10-CM

## 2021-05-11 ENCOUNTER — Encounter: Payer: Self-pay | Admitting: Family Medicine

## 2021-05-28 ENCOUNTER — Other Ambulatory Visit: Payer: Self-pay | Admitting: Family Medicine

## 2021-06-04 ENCOUNTER — Ambulatory Visit (INDEPENDENT_AMBULATORY_CARE_PROVIDER_SITE_OTHER)
Admission: RE | Admit: 2021-06-04 | Discharge: 2021-06-04 | Disposition: A | Discharge status: Home / Self Care | Payer: Self-pay | Source: Ambulatory Visit | Attending: Family Medicine | Admitting: Family Medicine

## 2021-06-04 ENCOUNTER — Other Ambulatory Visit: Payer: Self-pay

## 2021-06-04 DIAGNOSIS — G4452 New daily persistent headache (NDPH): Secondary | ICD-10-CM

## 2021-06-04 DIAGNOSIS — E782 Mixed hyperlipidemia: Secondary | ICD-10-CM

## 2021-06-04 DIAGNOSIS — R519 Headache, unspecified: Secondary | ICD-10-CM

## 2021-06-05 ENCOUNTER — Other Ambulatory Visit: Payer: Self-pay

## 2021-06-05 MED ORDER — ROSUVASTATIN CALCIUM 20 MG PO TABS: 20.0000 mg | ORAL_TABLET | Freq: Every day | ORAL | 3 refills | Status: DC

## 2021-06-25 ENCOUNTER — Ambulatory Visit (HOSPITAL_BASED_OUTPATIENT_CLINIC_OR_DEPARTMENT_OTHER): Payer: 59 | Admitting: Cardiology

## 2021-06-25 ENCOUNTER — Encounter (HOSPITAL_BASED_OUTPATIENT_CLINIC_OR_DEPARTMENT_OTHER): Payer: Self-pay | Admitting: Cardiology

## 2021-06-25 VITALS — BP 140/86 | HR 56 | Ht 65.0 in | Wt 166.4 lb

## 2021-06-25 DIAGNOSIS — I2510 Atherosclerotic heart disease of native coronary artery without angina pectoris: Secondary | ICD-10-CM

## 2021-06-25 DIAGNOSIS — Z712 Person consulting for explanation of examination or test findings: Secondary | ICD-10-CM

## 2021-06-25 DIAGNOSIS — E7800 Pure hypercholesterolemia, unspecified: Secondary | ICD-10-CM

## 2021-06-25 DIAGNOSIS — Z79899 Other long term (current) drug therapy: Secondary | ICD-10-CM

## 2021-06-25 DIAGNOSIS — Z7189 Other specified counseling: Secondary | ICD-10-CM

## 2021-06-25 DIAGNOSIS — I10 Essential (primary) hypertension: Secondary | ICD-10-CM | POA: Diagnosis not present

## 2021-06-25 MED ORDER — CHLORTHALIDONE 25 MG PO TABS: 25.0000 mg | ORAL_TABLET | Freq: Every day | ORAL | 3 refills | Status: DC

## 2021-06-25 MED ORDER — ASPIRIN EC 81 MG PO TBEC: 81.0000 mg | DELAYED_RELEASE_TABLET | Freq: Every day | ORAL | 3 refills | Status: DC

## 2021-06-25 NOTE — Patient Instructions (Addendum)
Medication Instructions:  ?We will trial off the metoprolol. When you get back from the beach, take 1/2 tab (25 mg) for 4 days, then 12.5 mg (1/2 of prior 25 mg tab) for 4 days, then stop. ? ?We will start chlorthalidone, take in the morning. When you come for your treadmill, get your kidney function checked. ? ?Ask ENT if they think this might be BPPV. ? ?*If you need a refill on your cardiac medications before your next appointment, please call your pharmacy* ? ? ?Lab Work: ?Your provider has recommended lab work (BMP). Please have this collected at [facility] [place]. The lab is open 8:00 am - 4:30 pm. Please avoid 12:00p - 1:00p for lunch hour. You do not need an appointment. Please go to [address] [place], [place] [address]. This is in the Primary Care office on the 3rd floor, let them know you are there for blood work and they will direct you to the lab. ? ?If you have labs (blood work) drawn today and your tests are completely normal, you will receive your results only by: ?MyChart Message (if you have MyChart) OR ?A paper copy in the mail ?If you have any lab test that is abnormal or we need to change your treatment, we will call you to review the results. ? ? ?Testing/Procedures: ?Your physician has requested that you have an exercise tolerance test. For further information please visit [email or web address]. Please also follow instruction sheet, as given.  ?[address] ? ?Follow-Up: ?At [facility], you and your health needs are our priority.  As part of our continuing mission to provide you with exceptional heart care, we have created designated Provider Care Teams.  These Care Teams include your primary Cardiologist (physician) and Advanced Practice Providers (APPs -  Physician Assistants and Nurse Practitioners) who all work together to provide you with the care you need, when you need it. ? ?We recommend signing up for the patient portal called  "MyChart".  Sign up information is provided on this After Visit Summary.  MyChart is used to connect with patients for Virtual Visits (Telemedicine).  Patients are able to view lab/test results, encounter notes, upcoming appointments, etc.  Non-urgent messages can be sent to your provider as well.   ?To learn more about what you can do with MyChart, go to [email or web address].   ? ?Your next appointment:   ?1 year(s) ? ?The format for your next appointment:   ?In Person ? ?Provider:   ?Buford Dresser, MD{ ? ? ? ?[facility] ?[address], [address] ?[place], [place] [address] ?Phone:  [phone] ? ?  ?  ? ? ?You are scheduled for an Exercise Stress Test ? ?Please arrive 15 minutes prior to your appointment time for registration and insurance purposes. ? ?The test will take approximately 45 minutes to complete. ? ?How to prepare for your Exercise Stress Test: ?Do bring a list of your current medications with you.  If not listed below, you may take your medications as normal. ?Do wear comfortable clothes (no dresses or overalls) and walking shoes, tennis shoes preferred (no heels or open toed shoes are allowed) ?Do Not wear cologne, perfume, aftershave or lotions (deodorant is allowed). ?Please report to [place], [address] for your test. ? ?If these instructions are not followed, your test will have to be rescheduled. ? ?If you have questions or concerns about your appointment, you can call the Stress Lab  at [phone]. ? ?If you cannot keep your appointment, please provide 24 hours notification to the Stress Lab, to avoid a possible $50 charge to your account ? ?

## 2021-06-25 NOTE — Progress Notes (Signed)
?Cardiology Office Note:   ? ?Date:  06/27/2021  ? ?ID:  Gregory Hard., DOB [birth year], MRN [id number] ? ?PCP:  Marin Olp, MD  ?Cardiologist:  Buford Dresser, MD ? ?Referring MD: Marin Olp, MD  ? ?CC: new patient consultation for evaluation of hyperlipidemia and elevated coronary calcium score ? ?History of Present Illness:   ? ?Gregory Uno. is a 66 y.o. male with a hx of mixed hyperlipidemia  who is seen as a new consult at the request of Marin Olp, MD for the evaluation and management of mixed hyperlipidemia and elevated coronary calcium score. ? ?His last visit with Dr. Yong Channel was on 04/17/2021. Notes reviewed. ? ?Cardiovascular risk factors: ?Prior clinical ASCVD: None. ?Comorbid conditions: Hypertension - Since the past 1-2 years. Recently he was started on metoprolol, since then his tremors have improved. Hyperlipidemia -  On rosuvastatin for about 1 month. ?Metabolic syndrome/Obesity: Highest adult weight was 174 lbs. ?Chronic inflammatory conditions: Arthritis of bilateral knees. ?Tobacco use history: Never. ?Family history: No known history of heart disease. His mother had a stroke at 31 yo. ?Prior cardiac testing and/or incidental findings on other testing (ie coronary calcium): CT Calcium Score 05/2021 which showed coronary calcium score of 418. This was 4 percentile for age-, race-, and sex-matched controls. ?Exercise level: Walks a couple times a week, at least 2 miles. Enjoys golfing. He also does yard work and washes his car. He does become short winded with activity, but not very easily. In the past few months he has head pressure with a sensation of falling. When he closes his eyes he does feel some mild imbalance. He denies vertigo. He has been referred to an ENT specialist. ?Current diet: Eating more fish, seafood. Fruit every day. He has tried cutting back on fried foods, french fries. Normally he does well with staying hydrated.  ? ?Typically he  monitors his blood pressure 1-2 times a week. Previously as high as the 150s, his BP lately averages in the 140s with some readings in the 130s. ? ?He endorses lymphedema in his LLE. This worsens if he does not wear his compression sock. The level of edema seen on exam today is typical for him. ? ?Every once in a while he uses Motrin to treat his headaches. ? ?He denies any palpitations, chest pain. No lightheadedness, syncope, orthopnea, or PND. ? ?Past Medical History:  ?Diagnosis Date  ? Adenomatous colon polyp   ? Allergy   ? Asthma   ? in past  ? Cellulitis 2001  ?  LLE ; [facility]  ? Cellulitis 2007  ? R elbow ; S/ P drainage by Dr Telford Nab  ? Cellulitis 2014  ? with sepsis  ? Cellulitis 08/09/2014  ? LLE  ? Diverticulosis   ? Eczema   ? Edema   ? Gastritis with intestinal metaplasia of stomach 08/07/2019  ? GERD (gastroesophageal reflux disease)   ? Hiatal hernia   ? Internal hemorrhoids   ? Recurrent cellulitis of lower leg 08/15/2014  ? Septic thrombophlebitis 08/15/2014  ? Testicular cancer [facility]) 2004  ? Dr Jeffie Pollock  ? ? ?Past Surgical History:  ?Procedure Laterality Date  ? COLONOSCOPY W/ POLYPECTOMY  05/14/2008 & 04/2012  ? Tubular adenoma; Dr. Verl Blalock  ? FOOT FRACTURE SURGERY Left 05/2012  ? FRACTURE SURGERY    ? I & D EXTREMITY Left 2004  ? elbow  ? KNEE ARTHROPLASTY Right   ?  Dr Noemi Chapel,  for  patellar instability  ? KNEE ARTHROSCOPY Right 1981  ?  Dr Noemi Chapel  ? REFRACTIVE SURGERY Bilateral 2004  ? SHOULDER SURGERY  07/2020  ? Right shoulder  ? testicle removed Right   ? pt. not 100 percent sure  ? TUMOR EXCISION  2005  ?  Seminoma; Dr Jeffie Pollock  ? VIDEO BRONCHOSCOPY N/A 11/03/2018  ? Procedure: VIDEO BRONCHOSCOPY WITHOUT FLUORO;  Surgeon: Collene Gobble, MD;  Location: [facility];  Service: Cardiopulmonary;  Laterality: N/A;  ? ? ?Current Medications: ?Current Outpatient Medications on File Prior to Visit  ?Medication Sig  ? albuterol (VENTOLIN HFA) 108 (90 Base) MCG/ACT inhaler Inhale 2 puffs  into the lungs every 6 (six) hours as needed for wheezing or shortness of breath.  ? alendronate (FOSAMAX) 70 MG tablet Take by mouth.  ? allopurinol (ZYLOPRIM) 100 MG tablet Take 100 mg by mouth daily.  ? augmented betamethasone dipropionate (DIPROLENE-AF) 0.05 % ointment APPLY TO THE AFFECTED AREA OF ECZEMA ON THE BODY AT BEDTIME  ? desonide (DESOWEN) 0.05 % ointment APPLY TO AFFECTED AREA OF ECZEMA ON FACE AT BEDTIME.  ? dexlansoprazole (DEXILANT) 60 MG capsule TAKE 1 CAPSULE(60 MG) BY MOUTH IN THE MORNING  ? DUPIXENT 300 MG/2ML SOSY   ? rosuvastatin (CRESTOR) 20 MG tablet Take 1 tablet (20 mg total) by mouth daily.  ? testosterone cypionate (DEPOTESTOSTERONE CYPIONATE) 200 MG/ML injection   ? ?No current facility-administered medications on file prior to visit.  ?  ? ?Allergies:   Patient has no known allergies.  ? ?Social History  ? ?Tobacco Use  ? Smoking status: Never  ? Smokeless tobacco: Never  ?Vaping Use  ? Vaping Use: Never used  ?Substance Use Topics  ? Alcohol use: Yes  ?  Alcohol/week: 12.0 standard drinks  ?  Types: 12 Cans of beer per week  ?  Comment: week  ? Drug use: No  ? ? ?Family History: ?family history includes Asthma in his sister; Cancer in his paternal grandfather; Colon cancer in his father; Eczema in his maternal uncle; Heart disease in his father; Liver cancer in his father; Transient ischemic attack in his mother. There is no history of Diabetes, Colon polyps, Esophageal cancer, Rectal cancer, or Stomach cancer. ? ?ROS:   ?Please see the history of present illness.  Additional pertinent ROS: ?Constitutional: Negative for chills, fever, night sweats, unintentional weight loss  ?HENT: Negative for ear pain and hearing loss. Positive for headaches, imbalance. ?Eyes: Negative for loss of vision and eye pain.  ?Respiratory: Negative for cough, sputum, wheezing. Positive for shortness of breath.   ?Cardiovascular: See HPI. ?Gastrointestinal: Negative for abdominal pain, melena, and  hematochezia.  ?Genitourinary: Negative for dysuria and hematuria.  ?Musculoskeletal: Negative for falls and myalgias. Positive for bilateral knee pain. ?Skin: Negative for itching and rash.  ?Neurological: Negative for focal weakness, and loss of consciousness.  ?Endo/Heme/Allergies: Does not bruise/bleed easily.   ? ? ?EKGs/Labs/Other Studies Reviewed:   ? ?The following studies were reviewed today: ? ?CT Cardiac Scoring 06/04/2021: ?FINDINGS: ?Coronary Calcium Score: ?  ?Left main: .407 ?  ?Left anterior descending artery: 198 ?  ?Left circumflex artery: 37.2 ?  ?Right coronary artery: 183 ?  ?Total: 418 ?  ?Percentile: 83 ?  ?Pericardium: Normal. ?  ?Ascending Aorta: Normal caliber. ?  ?Non-cardiac: See separate report from [facility]. ?  ?IMPRESSION: ?Coronary calcium score of 418. This was 1 percentile for age-, ?race-, and sex-matched controls. ? ? ?EKG:  EKG is personally reviewed.   ?  06/25/2021: sinus bradycardia at 56 bpm ? ?Recent Labs: ?04/22/2021: ALT 43; BUN 11; Creatinine, Ser 0.79; Hemoglobin 15.1; Platelets 217.0; Potassium 4.2; Sodium 138  ? ?Recent Lipid Panel ?   ?Component Value Date/Time  ? CHOL 221 (H) 04/22/2021 1057  ? CHOL 191 09/03/2014 0845  ? TRIG 68.0 04/22/2021 1057  ? TRIG 129 09/03/2014 0845  ? HDL 70.60 04/22/2021 1057  ? HDL 63 09/03/2014 0845  ? CHOLHDL 3 04/22/2021 1057  ? VLDL 13.6 04/22/2021 1057  ? LDLCALC 137 (H) 04/22/2021 1057  ? [place] 102 (H) 09/03/2014 0845  ? LDLDIRECT 148.1 07/22/2012 1139  ? ? ?Physical Exam:   ? ?VS:  BP 140/86   Pulse (!) 56   Ht '5\' 5"'$  (1.651 m)   Wt 166 lb 6.4 oz (75.5 kg)   SpO2 98%   BMI 27.69 kg/m?    ? ?Wt Readings from Last 3 Encounters:  ?06/25/21 166 lb 6.4 oz (75.5 kg)  ?04/17/21 169 lb 6.4 oz (76.8 kg)  ?03/07/21 171 lb 9.6 oz (77.8 kg)  ?  ?GEN: Well nourished, well developed in no acute distress ?HEENT: Normal, moist mucous membranes ?NECK: No JVD ?CARDIAC: regular rhythm, normal S1 and S2, no rubs or gallops. No  murmur. ?VASCULAR: Radial and DP pulses 2+ bilaterally. No carotid bruits ?RESPIRATORY:  Clear to auscultation without rales, wheezing or rhonchi  ?ABDOMEN: Soft, non-tender, non-distended ?MUSCULOSKELETAL:  Ambulates independentl

## 2021-07-07 ENCOUNTER — Other Ambulatory Visit: Payer: Self-pay | Admitting: Family Medicine

## 2021-07-07 DIAGNOSIS — L209 Atopic dermatitis, unspecified: Secondary | ICD-10-CM

## 2021-07-08 ENCOUNTER — Telehealth (HOSPITAL_COMMUNITY): Payer: Self-pay | Admitting: *Deleted

## 2021-07-08 NOTE — Telephone Encounter (Signed)
Close encounter 

## 2021-07-09 ENCOUNTER — Ambulatory Visit (HOSPITAL_COMMUNITY)
Admission: RE | Admit: 2021-07-09 | Discharge: 2021-07-09 | Disposition: A | Discharge status: Home / Self Care | Payer: 59 | Source: Ambulatory Visit | Attending: Cardiovascular Disease | Admitting: Cardiovascular Disease

## 2021-07-09 DIAGNOSIS — I2510 Atherosclerotic heart disease of native coronary artery without angina pectoris: Secondary | ICD-10-CM | POA: Insufficient documentation

## 2021-07-09 LAB — EXERCISE TOLERANCE TEST
Angina Index: 0
Estimated workload: 10.1
Exercise duration (min): 9 min
Exercise duration (sec): 1 s
MPHR: 157 {beats}/min
Peak HR: 146 {beats}/min
Percent HR: 92 %
Rest HR: 88 {beats}/min

## 2021-07-11 ENCOUNTER — Encounter (HOSPITAL_BASED_OUTPATIENT_CLINIC_OR_DEPARTMENT_OTHER): Payer: Self-pay

## 2021-10-02 ENCOUNTER — Encounter: Payer: Self-pay | Admitting: *Deleted

## 2021-10-02 DIAGNOSIS — Z006 Encounter for examination for normal comparison and control in clinical research program: Secondary | ICD-10-CM

## 2021-10-02 NOTE — Research (Signed)
I called patient to see if he was interested in the [place]. I left message for patient to call me. I will send e-mail to patient also.

## 2021-10-30 ENCOUNTER — Ambulatory Visit (INDEPENDENT_AMBULATORY_CARE_PROVIDER_SITE_OTHER): Payer: 59 | Admitting: Sports Medicine

## 2021-10-30 ENCOUNTER — Ambulatory Visit: Payer: Self-pay

## 2021-10-30 VITALS — BP 140/76 | Ht 65.0 in | Wt 165.0 lb

## 2021-10-30 DIAGNOSIS — M25561 Pain in right knee: Secondary | ICD-10-CM

## 2021-10-30 MED ORDER — METHYLPREDNISOLONE ACETATE 40 MG/ML IJ SUSP
40.0000 mg | Freq: Once | INTRAMUSCULAR | Status: AC
  Administered 2021-10-30: 40 mg via INTRA_ARTICULAR

## 2021-10-30 NOTE — Progress Notes (Signed)
  Gregory Phillips. - 66 y.o. male MRN [id number]  Date of birth: Aug 21, 1955    CHIEF COMPLAINT:   R knee pain    SUBJECTIVE:   HPI: Patient is a pleasant 66 year old male comes to clinic for evaluation of his right knee.  He has a history of gout for a number of years now, in his knees, big toes, and wrists.  His gout is usually well controlled but has flared up 3 times this year.  He is now on allopurinol prophylactically.  His right knee is the joint that is most recently affected.  It has been swollen and painful for about 2 weeks.  He was previously seen by Dr. Layne Benton who sent him over for ultrasound evaluation.   ROS:     See HPI  PERTINENT  PMH / PSH FH / / SH:  Past Medical, Surgical, Social, and Family History Reviewed & Updated in the EMR.  Pertinent findings include:  Gout  OBJECTIVE: BP (!) 140/76   Ht '5\' 5"'$  (1.651 m)   Wt 165 lb (74.8 kg)   BMI 27.46 kg/m   Physical Exam:  Vital signs are reviewed.  GEN: Alert and oriented, NAD Pulm: Breathing unlabored PSY: normal mood, congruent affect  MSK: R Knee -inspection there is obvious soft tissue swelling over the lateral aspect of the knee, no overlying erythema.  He is tender to palpation at the lateral joint line.  He is full range of motion in the knee and 5/5 strength.  He is neurovascularly intact distally.  ULTRASOUND: RT Knee The right suprapatellar pouch was visualized in longitudinal and short axis view.  There is minimal fluid in the suprapatellar pouch.   There are several small collections of hypoechoic fluid in the soft tissue in the lateral aspect of the knee but nothing drainable. Quadriceps and Patellar tendon unremarkable.  Impression: soft tissue swelling around knee by history as probable reaction to gout  Ultrasound and interpretation by Gregory Drum, MD and Wolfgang Phoenix. Fields, MD   ASSESSMENT & PLAN:  1. R Knee Pain -Knee pain secondary to gout flare.  There is no joint effusion seen on  ultrasound that would be amenable to drainage today.  We will proceed with a corticosteroid injection into the soft tissue space where the gout is flaring in an effort to decrease his inflammation.  He will continue on allopurinol prophylactically.  Patient can follow-up with Dr. Layne Benton as needed.  Procedure performed: Knee Suprapatellar Gout Pocket Injection  Consent obtained and verified. Time-out conducted. Noted no overlying erythema, induration, or other signs of local infection. The lateral suprapatellar pouch was palpated and marked and visualized under ultrasound. The overlying skin was prepped in a sterile fashion. Topical analgesic spray: Ethyl chloride. Needle: 25 gauge, 1.5 inch Under ultrasound guidance the needle was inserted in plane from a superior medial approach into the pocket of fluid from the gout flare.  Completed without difficulty. Meds: 40 mg methylrprednisolone, 2 ml 1% lidocaine without epinephrine  Advised to call if fevers/chills, erythema, induration, drainage, or persistent bleeding.   Dortha Kern, MD PGY-4, Sports Medicine Fellow [place]  I observed and examined the patient with the [facility] resident and agree with assessment and plan.  Note reviewed and modified by me. Ila Mcgill, MD

## 2021-11-21 ENCOUNTER — Telehealth: Payer: Self-pay | Admitting: *Deleted

## 2021-11-21 DIAGNOSIS — Z006 Encounter for examination for normal comparison and control in clinical research program: Secondary | ICD-10-CM

## 2021-11-21 NOTE — Telephone Encounter (Signed)
I called patient  to follow-up on research study Victorion-1 Prevention study.I left message for patient to call me if interested in study.

## 2021-12-12 ENCOUNTER — Ambulatory Visit (INDEPENDENT_AMBULATORY_CARE_PROVIDER_SITE_OTHER): Payer: 59 | Admitting: Cardiology

## 2021-12-12 ENCOUNTER — Encounter (HOSPITAL_BASED_OUTPATIENT_CLINIC_OR_DEPARTMENT_OTHER): Payer: Self-pay | Admitting: Cardiology

## 2021-12-12 VITALS — BP 138/78 | HR 72 | Ht 65.0 in | Wt 157.1 lb

## 2021-12-12 DIAGNOSIS — E7800 Pure hypercholesterolemia, unspecified: Secondary | ICD-10-CM

## 2021-12-12 DIAGNOSIS — I10 Essential (primary) hypertension: Secondary | ICD-10-CM | POA: Diagnosis not present

## 2021-12-12 DIAGNOSIS — Z79899 Other long term (current) drug therapy: Secondary | ICD-10-CM

## 2021-12-12 DIAGNOSIS — I2510 Atherosclerotic heart disease of native coronary artery without angina pectoris: Secondary | ICD-10-CM

## 2021-12-12 DIAGNOSIS — M1A09X0 Idiopathic chronic gout, multiple sites, without tophus (tophi): Secondary | ICD-10-CM

## 2021-12-12 MED ORDER — VALSARTAN 40 MG PO TABS: 40.0000 mg | ORAL_TABLET | Freq: Every day | ORAL | 3 refills | Status: DC

## 2021-12-12 NOTE — Progress Notes (Signed)
Cardiology Office Note:    Date:  12/12/2021   ID:  Gregory Hard., DOB [birth year], MRN [id number]  PCP:  Marin Olp, MD  Cardiologist:  Buford Dresser, MD  Referring MD: Marin Olp, MD   CC: follow up  History of Present Illness:    Gregory Kirksey. is a 66 y.o. male with a hx of mixed hyperlipidemia  who is seen for follow up today. I initially met him 06/25/21 as a new consult at the request of Marin Olp, MD for the evaluation and management of mixed hyperlipidemia and elevated coronary calcium score.  Cardiovascular risk factors: Hypertension - Since the past 1-2 years. Recently he was started on metoprolol, since then his tremors have improved. Hyperlipidemia -  On rosuvastatin for about 1 month. -Family history: No known history of heart disease. His mother had a stroke at 71 yo. -CT Calcium Score 05/2021 which showed coronary calcium score of 418. This was 45 percentile for age-, race-, and sex-matched controls.  Today: Has had some issues with gout, asking about his medications. Was told that his recent uric acid was elevated (I cannot see results, ordered through [facility]). Now on allopurinol daily.  Reviewed home BP log. Range 117/73-144/88, most average 120s/80.   Still having intermittent headaches/dizziness, even with blood pressure better controlled.   Had questions about inclisiran relating to a clinical trial, discussed today.  Denies chest pain, shortness of breath at rest or with normal exertion. No PND, orthopnea, change in LE edema or unexpected weight gain. No syncope or palpitations.   Past Medical History:  Diagnosis Date   Adenomatous colon polyp    Allergy    Asthma    in past   Cellulitis 2001    LLE ; [facility]   Cellulitis 2007   R elbow ; S/ P drainage by Dr Telford Nab   Cellulitis 2014   with sepsis   Cellulitis 08/09/2014   LLE   Diverticulosis    Eczema    Edema    Gastritis with  intestinal metaplasia of stomach 08/07/2019   GERD (gastroesophageal reflux disease)    Hiatal hernia    Internal hemorrhoids    Recurrent cellulitis of lower leg 08/15/2014   Septic thrombophlebitis 08/15/2014   Testicular cancer [facility]) 2004   Dr Jeffie Pollock    Past Surgical History:  Procedure Laterality Date   COLONOSCOPY W/ POLYPECTOMY  05/14/2008 & 04/2012   Tubular adenoma; Dr. Verl Blalock   FOOT FRACTURE SURGERY Left 05/2012   FRACTURE SURGERY     I & D EXTREMITY Left 2004   elbow   KNEE ARTHROPLASTY Right     Dr Noemi Chapel,  for patellar instability   KNEE ARTHROSCOPY Right 1981    Dr Noemi Chapel   REFRACTIVE SURGERY Bilateral 2004   [place]  07/2020   Right shoulder   testicle removed Right    pt. not 100 percent sure   TUMOR EXCISION  2005    [place]; Dr Jeffie Pollock   VIDEO BRONCHOSCOPY N/A 11/03/2018   Procedure: VIDEO BRONCHOSCOPY WITHOUT FLUORO;  Surgeon: Collene Gobble, MD;  Location: [facility] ENDOSCOPY;  Service: Cardiopulmonary;  Laterality: N/A;    Current Medications: Current Outpatient Medications on File Prior to Visit  Medication Sig   alendronate (FOSAMAX) 70 MG tablet Take by mouth.   allopurinol (ZYLOPRIM) 100 MG tablet Take 100 mg by mouth daily.   augmented betamethasone dipropionate (DIPROLENE-AF) 0.05 % ointment APPLY TO THE AFFECTED  AREA OF ECZEMA ON THE BODY AT BEDTIME   desonide (DESOWEN) 0.05 % ointment APPLY TOPICALLY TO FACE TWICE DAILY AS NEEDED   dexlansoprazole (DEXILANT) 60 MG capsule TAKE 1 CAPSULE(60 MG) BY MOUTH IN THE MORNING   DUPIXENT 300 MG/2ML SOSY    rosuvastatin (CRESTOR) 20 MG tablet Take 1 tablet (20 mg total) by mouth daily.   testosterone cypionate (DEPOTESTOSTERONE CYPIONATE) 200 MG/ML injection    albuterol (VENTOLIN HFA) 108 (90 Base) MCG/ACT inhaler Inhale 2 puffs into the lungs every 6 (six) hours as needed for wheezing or shortness of breath. (Patient not taking: Reported on 12/12/2021)   No current facility-administered  medications on file prior to visit.     Allergies:   Patient has no known allergies.   Social History   Tobacco Use   Smoking status: Never   Smokeless tobacco: Never  Vaping Use   Vaping Use: Never used  Substance Use Topics   Alcohol use: Yes    Alcohol/week: 12.0 standard drinks of alcohol    Types: 12 Cans of beer per week    Comment: week   Drug use: No    Family History: family history includes Asthma in his sister; Cancer in his paternal grandfather; Colon cancer in his father; Eczema in his maternal uncle; Heart disease in his father; Liver cancer in his father; Transient ischemic attack in his mother. There is no history of Diabetes, Colon polyps, Esophageal cancer, Rectal cancer, or Stomach cancer.  ROS:   Please see the history of present illness.  Additional pertinent ROS otherwise unremarkable   EKGs/Labs/Other Studies Reviewed:    The following studies were reviewed today:  ETT 07/09/21 ST elevation (aVR) and up sloping ST depression was noted. The ECG was negative for ischemia.   Prior study not available for comparison.   Baseline ST abnormalities show elevated in aVR, upsloping ST depressions diffusely in other leads. This is accentuated but does not otherwise change in exercise, consistent with nonspecific findings. No high risk changes suggestive of severe ischemia.  CT Cardiac Scoring 06/04/2021: FINDINGS: Coronary Calcium Score:   Left main: .407   Left anterior descending artery: 198   Left circumflex artery: 37.2   Right coronary artery: 183   Total: 418   Percentile: 83   Pericardium: Normal.   Ascending Aorta: Normal caliber.   Non-cardiac: See separate report from [facility].   IMPRESSION: Coronary calcium score of 418. This was 55 percentile for age-, race-, and sex-matched controls.   EKG:  EKG is personally reviewed.   12/12/21: not ordered today 06/25/2021: sinus bradycardia at 56 bpm  Recent Labs: 04/22/2021: ALT  43; BUN 11; Creatinine, Ser 0.79; Hemoglobin 15.1; Platelets 217.0; Potassium 4.2; Sodium 138   Recent Lipid Panel    Component Value Date/Time   CHOL 221 (H) 04/22/2021 1057   CHOL 191 09/03/2014 0845   TRIG 68.0 04/22/2021 1057   TRIG 129 09/03/2014 0845   HDL 70.60 04/22/2021 1057   HDL 63 09/03/2014 0845   CHOLHDL 3 04/22/2021 1057   VLDL 13.6 04/22/2021 1057   LDLCALC 137 (H) 04/22/2021 1057   LDLCALC 102 (H) 09/03/2014 0845   LDLDIRECT 148.1 07/22/2012 1139    Physical Exam:    VS:  BP 138/78 (BP Location: Right Arm, Patient Position: Sitting)   Pulse 72   Ht _0  (1.651 m)   Wt 157 lb 1.6 oz (71.3 kg)   SpO2 98%   BMI 26.14 kg/m     Wt  Readings from Last 3 Encounters:  12/12/21 157 lb 1.6 oz (71.3 kg)  10/30/21 165 lb (74.8 kg)  06/25/21 166 lb 6.4 oz (75.5 kg)    GEN: Well nourished, well developed in no acute distress HEENT: Normal, moist mucous membranes NECK: No JVD CARDIAC: regular rhythm, normal S1 and S2, no rubs or gallops. No murmur. VASCULAR: Radial and DP pulses 2+ bilaterally. No carotid bruits RESPIRATORY:  Clear to auscultation without rales, wheezing or rhonchi  ABDOMEN: Soft, non-tender, non-distended MUSCULOSKELETAL:  Ambulates independently SKIN: Warm and dry, nonpitting trivial LLE lymphedema NEUROLOGIC:  Alert and oriented x 3. No focal neuro deficits noted. PSYCHIATRIC:  Normal affect    ASSESSMENT:    1. Essential hypertension   2. Nonocclusive coronary atherosclerosis of native coronary artery   3. Pure hypercholesterolemia   4. Medication management   5. Chronic gout of multiple sites, unspecified cause     PLAN:    Coronary calcification, consistent with nonobstructive CAD Hypercholesterolemia -calcium score 418. ETT negative for ischemia -previously on aspirin, stopped  -tolerating rosuvastatin, recheck lipids  Hypertension -reports gout flares and elevated uric acid (I cannot see labs) -stop chlorthalidone, start  valsartan -baseline bmet today, then recheck in 2-3 weeks  Cardiac risk counseling and prevention recommendations: -recommend heart healthy/Mediterranean diet, with whole grains, fruits, vegetable, fish, lean meats, nuts, and olive oil. Limit salt. -recommend moderate walking, 3-5 times/week for 30-50 minutes each session. Aim for at least 150 minutes.week. Goal should be pace of 3 miles/hours, or walking 1.5 miles in 30 minutes -recommend avoidance of tobacco products. Avoid excess alcohol. -ASCVD risk score: The 10-year ASCVD risk score (Arnett DK, et al., 2019) is: 13.6%   Values used to calculate the score:     Age: 70 years     Sex: Male     Is Non-Hispanic African American: No     Diabetic: No     Tobacco smoker: No     Systolic Blood Pressure: 588 mmHg     Is BP treated: Yes     HDL Cholesterol: 70.6 mg/dL     Total Cholesterol: 221 mg/dL    Plan for follow up: 1 year or sooner as needed.  Buford Dresser, MD, PhD, [facility]    Medication Adjustments/Labs and Tests Ordered: Current medicines are reviewed at length with the patient today.  Concerns regarding medicines are outlined above.   Orders Placed This Encounter  Procedures   Lipid panel   Basic metabolic panel   Basic metabolic panel   Meds ordered this encounter  Medications   valsartan (DIOVAN) 40 MG tablet    Sig: Take 1 tablet (40 mg total) by mouth daily.    Dispense:  90 tablet    Refill:  3   Patient Instructions  Medication Instructions:  We are stopping the chlorthalidone because of gout. We will start valsartan at a low dose and monitor your kidney function. If your blood pressure is consistently more than 140/90, call me and we will go up on the dose of valsartan. Goal is to average <130/80 but I don't want to drop it too much to get started.  *If you need a refill on your cardiac medications before your next appointment, please call your pharmacy*   Lab Work: Your  provider has recommended lab work today (Lipid, BMP), then in 2-3 weeks (BMP). Please have this collected at [facility]. The lab is open 8:00 am - 4:30 pm. Please  avoid 12:00p - 1:00p for lunch hour. You do not need an appointment. Please go to [address] [place], [place] [address]. This is in the Primary Care office on the 3rd floor, let them know you are there for blood work and they will direct you to the lab.   If you have labs (blood work) drawn today and your tests are completely normal, you will receive your results only by: [place] (if you have MyChart) OR A paper copy in the mail If you have any lab test that is abnormal or we need to change your treatment, we will call you to review the results.   Testing/Procedures: None ordered today   Follow-Up: At [facility], you and your health needs are our priority.  As part of our continuing mission to provide you with exceptional heart care, we have created designated Provider Care Teams.  These Care Teams include your primary Cardiologist (physician) and Advanced Practice Providers (APPs -  Physician Assistants and Nurse Practitioners) who all work together to provide you with the care you need, when you need it.  We recommend signing up for the patient portal called "MyChart".  Sign up information is provided on this After Visit Summary.  MyChart is used to connect with patients for Virtual Visits (Telemedicine).  Patients are able to view lab/test results, encounter notes, upcoming appointments, etc.  Non-urgent messages can be sent to your provider as well.   To learn more about what you can do with MyChart, go to [email or web address].    Your next appointment:   1 year(s)  The format for your next appointment:   In Person  Provider:   Buford Dresser, MD      Signed, Buford Dresser, MD PhD 12/12/2021 10:57 AM    [place]

## 2021-12-12 NOTE — Patient Instructions (Addendum)
Medication Instructions:  We are stopping the chlorthalidone because of gout. We will start valsartan at a low dose and monitor your kidney function. If your blood pressure is consistently more than 140/90, call me and we will go up on the dose of valsartan. Goal is to average <130/80 but I don't want to drop it too much to get started.  *If you need a refill on your cardiac medications before your next appointment, please call your pharmacy*   Lab Work: Your provider has recommended lab work today (Lipid, BMP), then in 2-3 weeks (BMP). Please have this collected at [facility]. The lab is open 8:00 am - 4:30 pm. Please avoid 12:00p - 1:00p for lunch hour. You do not need an appointment. Please go to [address] [place], [place] [address]. This is in the Primary Care office on the 3rd floor, let them know you are there for blood work and they will direct you to the lab.   If you have labs (blood work) drawn today and your tests are completely normal, you will receive your results only by: [place] (if you have MyChart) OR A paper copy in the mail If you have any lab test that is abnormal or we need to change your treatment, we will call you to review the results.   Testing/Procedures: None ordered today   Follow-Up: At [facility], you and your health needs are our priority.  As part of our continuing mission to provide you with exceptional heart care, we have created designated Provider Care Teams.  These Care Teams include your primary Cardiologist (physician) and Advanced Practice Providers (APPs -  Physician Assistants and Nurse Practitioners) who all work together to provide you with the care you need, when you need it.  We recommend signing up for the patient portal called "MyChart".  Sign up information is provided on this After Visit Summary.  MyChart is used to connect with patients for Virtual Visits (Telemedicine).  Patients  are able to view lab/test results, encounter notes, upcoming appointments, etc.  Non-urgent messages can be sent to your provider as well.   To learn more about what you can do with MyChart, go to [email or web address].    Your next appointment:   1 year(s)  The format for your next appointment:   In Person  Provider:   Buford Dresser, MD

## 2021-12-13 LAB — LIPID PANEL
Chol/HDL Ratio: 2.3 ratio (ref 0.0–5.0)
Cholesterol, Total: 177 mg/dL (ref 100–199)
HDL: 78 mg/dL (ref >39)
LDL Chol Calc (NIH): 85 mg/dL (ref 0–99)
Triglycerides: 76 mg/dL (ref 0–149)
VLDL Cholesterol Cal: 14 mg/dL (ref 5–40)

## 2021-12-13 LAB — BASIC METABOLIC PANEL
BUN/Creatinine Ratio: 16 (ref 10–24)
BUN: 13 mg/dL (ref 8–27)
CO2: 27 mmol/L (ref 20–29)
Calcium: 9.9 mg/dL (ref 8.6–10.2)
Chloride: 97 mmol/L (ref 96–106)
Creatinine, Ser: 0.82 mg/dL (ref 0.76–1.27)
Glucose: 102 mg/dL — ABNORMAL HIGH (ref 70–99)
Potassium: 4 mmol/L (ref 3.5–5.2)
Sodium: 139 mmol/L (ref 134–144)
eGFR: 98 mL/min/{1.73_m2} (ref >59)

## 2021-12-15 ENCOUNTER — Encounter: Payer: Self-pay | Admitting: *Deleted

## 2021-12-30 ENCOUNTER — Other Ambulatory Visit: Payer: Self-pay | Admitting: Family Medicine

## 2022-01-22 ENCOUNTER — Emergency Department (HOSPITAL_BASED_OUTPATIENT_CLINIC_OR_DEPARTMENT_OTHER)
Admission: EM | Admit: 2022-01-22 | Discharge: 2022-01-22 | Disposition: A | Discharge status: Home / Self Care | Payer: 59 | Attending: Emergency Medicine | Admitting: Emergency Medicine

## 2022-01-22 ENCOUNTER — Other Ambulatory Visit: Payer: Self-pay

## 2022-01-22 ENCOUNTER — Encounter (HOSPITAL_BASED_OUTPATIENT_CLINIC_OR_DEPARTMENT_OTHER): Payer: Self-pay

## 2022-01-22 DIAGNOSIS — S61217A Laceration without foreign body of left little finger without damage to nail, initial encounter: Secondary | ICD-10-CM | POA: Insufficient documentation

## 2022-01-22 DIAGNOSIS — W268XXA Contact with other sharp object(s), not elsewhere classified, initial encounter: Secondary | ICD-10-CM | POA: Diagnosis not present

## 2022-01-22 DIAGNOSIS — S6992XA Unspecified injury of left wrist, hand and finger(s), initial encounter: Secondary | ICD-10-CM | POA: Diagnosis present

## 2022-01-22 DIAGNOSIS — Y92009 Unspecified place in unspecified non-institutional (private) residence as the place of occurrence of the external cause: Secondary | ICD-10-CM | POA: Insufficient documentation

## 2022-01-22 NOTE — ED Notes (Signed)
Discharge instructions discussed with pt. Pt verbalized understanding. Pt stable and ambulatory.  °

## 2022-01-22 NOTE — Discharge Instructions (Signed)
Keep your hand dry for 24 hours.  Tomorrow you can take down the dressing in the morning, and your wound should no longer be bleeding.  If it does begin to ooze or bleed you should hold firm pressure with your other thumb directly over the wound, or just below it, for 5 to 10 minutes without letting go.  This will generally stop most cases of bleeding.  The glue itself will gradually dissolve over the course of 7 to 10 days.  Do not pick at it or try to pull it off.

## 2022-01-22 NOTE — ED Triage Notes (Signed)
Patient here POV from Home.  Endorses Cutting his Left Anterior Distal Fifth Digit against a Grill.   Unknown Tetanus. This Occurred 1 Hour ago.   NAD Noted during Triage. A&Ox4. Gcs 15. Ambulatory.

## 2022-01-22 NOTE — ED Provider Notes (Signed)
[name] EMERGENCY DEPT Provider Note   CSN: [id number] Arrival date & time: 01/22/22  1547     History  Chief Complaint  Patient presents with   Laceration    Gregory Phillips. is a 66 y.o. male presenting to Emergency Department with a laceration to his left little finger, he cut it on a grill earlier today, washed it well at home.  He believes his tetanus is been up-to-date in the past 10 years for his doctor's office does not want an update today.  He is not on blood thinners  HPI     Home Medications Prior to Admission medications   Medication Sig Start Date End Date Taking? Authorizing Provider  albuterol (VENTOLIN HFA) 108 (90 Base) MCG/ACT inhaler Inhale 2 puffs into the lungs every 6 (six) hours as needed for wheezing or shortness of breath. Patient not taking: Reported on 12/12/2021 04/17/21   Marin Olp, MD  alendronate (FOSAMAX) 70 MG tablet Take by mouth. 10/25/18   [provider]  allopurinol (ZYLOPRIM) 100 MG tablet Take 100 mg by mouth daily.    [provider]  augmented betamethasone dipropionate (DIPROLENE-AF) 0.05 % ointment APPLY TO THE AFFECTED AREA OF ECZEMA ON THE BODY AT BEDTIME. 12/30/21   Marin Olp, MD  desonide (DESOWEN) 0.05 % ointment APPLY TOPICALLY TO FACE TWICE DAILY AS NEEDED 07/08/21   Marin Olp, MD  dexlansoprazole (DEXILANT) 60 MG capsule TAKE 1 CAPSULE(60 MG) BY MOUTH IN THE MORNING 03/06/21   Pyrtle, Lajuan Lines, MD  DUPIXENT 300 MG/2ML SOSY  03/15/17   [provider]  rosuvastatin (CRESTOR) 20 MG tablet Take 1 tablet (20 mg total) by mouth daily. 06/05/21   Marin Olp, MD  testosterone cypionate (DEPOTESTOSTERONE CYPIONATE) 200 MG/ML injection  03/29/19   [provider]  valsartan (DIOVAN) 40 MG tablet Take 1 tablet (40 mg total) by mouth daily. 12/12/21   Buford Dresser, MD      Allergies    Patient has no known allergies.    Review of Systems   Review  of Systems  Physical Exam Updated Vital Signs BP (!) 139/102 (BP Location: Right Arm)   Pulse 78   Temp 98.2 F (36.8 C) (Oral)   Resp 16   Ht '5\' 5"'$  (1.651 m)   Wt 71.3 kg   SpO2 98%   BMI 26.16 kg/m  Physical Exam Constitutional:      General: He is not in acute distress. HENT:     Head: Normocephalic and atraumatic.  Eyes:     Conjunctiva/sclera: Conjunctivae normal.     Pupils: Pupils are equal, round, and reactive to light.  Cardiovascular:     Rate and Rhythm: Normal rate and regular rhythm.  Pulmonary:     Effort: Pulmonary effort is normal. No respiratory distress.  Skin:    General: Skin is warm and dry.     Comments: 1 cm linear laceration over the palmar aspect of the distal fifth finger, no active bleeding  Neurological:     General: No focal deficit present.     Mental Status: He is alert. Mental status is at baseline.  Psychiatric:        Mood and Affect: Mood normal.        Behavior: Behavior normal.     ED Results / Procedures / Treatments   Labs (all labs ordered are listed, but only abnormal results are displayed) Labs Reviewed - No data to display  EKG  None  Radiology No results found.  Procedures .Marland KitchenLaceration Repair  Date/Time: 01/22/2022 5:15 PM  Performed by: Wyvonnia Dusky, MD Authorized by: Wyvonnia Dusky, MD   Consent:    Consent obtained:  Verbal and written   Consent given by:  Patient   Risks discussed:  Pain, poor cosmetic result and poor wound healing Universal protocol:    Procedure explained and questions answered to patient or proxy's satisfaction: yes     Immediately prior to procedure, a time out was called: yes     Patient identity confirmed:  Arm band Anesthesia:    Anesthesia method:  None Laceration details:    Location:  Finger   Finger location:  L small finger   Length (cm):  1   Depth (mm):  2 Exploration:    Contaminated: no   Skin repair:    Repair method:  Tissue adhesive Approximation:     Approximation:  Close Repair type:    Repair type:  Simple Post-procedure details:    Dressing:  Non-adherent dressing   Procedure completion:  Tolerated well, no immediate complications     Medications Ordered in ED Medications - No data to display  ED Course/ Medical Decision Making/ A&P                           Medical Decision Making  Patient is here with a small linear laceration which does not appear infected, over the distal finger.  By the time he got here with a pressure wrapping applied, he is no longer actively bleeding.  We opted for Dermabond as this was a small superficial wound.  I reapplied a pressure dressing, moderate pressure, for him to wear overnight.  He does not want a tetanus update at this time.  Okay for discharge        Final Clinical Impression(s) / ED Diagnoses Final diagnoses:  Laceration of left little finger without foreign body without damage to nail, initial encounter    Rx / DC Orders ED Discharge Orders     None         Freedom Lopezperez, Carola Rhine, MD 01/22/22 [phone]

## 2022-02-10 ENCOUNTER — Telehealth: Payer: Self-pay | Admitting: *Deleted

## 2022-02-10 NOTE — Telephone Encounter (Signed)
Called Mr. Wolters to follow-up on research trial. Left message.

## 2022-03-05 ENCOUNTER — Encounter: Payer: Self-pay | Admitting: *Deleted

## 2022-04-06 ENCOUNTER — Encounter: Payer: Self-pay | Admitting: Internal Medicine

## 2022-06-22 ENCOUNTER — Other Ambulatory Visit: Payer: Self-pay | Admitting: Family Medicine

## 2022-06-30 ENCOUNTER — Encounter (HOSPITAL_BASED_OUTPATIENT_CLINIC_OR_DEPARTMENT_OTHER): Payer: Self-pay

## 2022-06-30 NOTE — Telephone Encounter (Signed)
Okay to order his refills in your name?

## 2022-07-03 MED ORDER — ROSUVASTATIN CALCIUM 20 MG PO TABS: 20.0000 mg | ORAL_TABLET | Freq: Every day | ORAL | 3 refills | Status: DC

## 2022-10-27 DIAGNOSIS — E291 Testicular hypofunction: Secondary | ICD-10-CM | POA: Diagnosis not present

## 2022-11-16 DIAGNOSIS — L2089 Other atopic dermatitis: Secondary | ICD-10-CM | POA: Diagnosis not present

## 2022-11-16 DIAGNOSIS — E291 Testicular hypofunction: Secondary | ICD-10-CM | POA: Diagnosis not present

## 2022-11-16 DIAGNOSIS — L814 Other melanin hyperpigmentation: Secondary | ICD-10-CM | POA: Diagnosis not present

## 2022-11-16 DIAGNOSIS — B078 Other viral warts: Secondary | ICD-10-CM | POA: Diagnosis not present

## 2022-11-17 DIAGNOSIS — Z6827 Body mass index (BMI) 27.0-27.9, adult: Secondary | ICD-10-CM | POA: Diagnosis not present

## 2022-11-17 DIAGNOSIS — M79641 Pain in right hand: Secondary | ICD-10-CM | POA: Diagnosis not present

## 2022-11-17 DIAGNOSIS — M79642 Pain in left hand: Secondary | ICD-10-CM | POA: Diagnosis not present

## 2022-11-17 DIAGNOSIS — M25561 Pain in right knee: Secondary | ICD-10-CM | POA: Diagnosis not present

## 2022-11-17 DIAGNOSIS — M109 Gout, unspecified: Secondary | ICD-10-CM | POA: Diagnosis not present

## 2022-11-17 DIAGNOSIS — E663 Overweight: Secondary | ICD-10-CM | POA: Diagnosis not present

## 2022-11-17 DIAGNOSIS — M1991 Primary osteoarthritis, unspecified site: Secondary | ICD-10-CM | POA: Diagnosis not present

## 2022-11-17 DIAGNOSIS — M25562 Pain in left knee: Secondary | ICD-10-CM | POA: Diagnosis not present

## 2022-12-15 ENCOUNTER — Encounter (HOSPITAL_BASED_OUTPATIENT_CLINIC_OR_DEPARTMENT_OTHER): Payer: Self-pay | Admitting: Cardiology

## 2022-12-15 ENCOUNTER — Ambulatory Visit (HOSPITAL_BASED_OUTPATIENT_CLINIC_OR_DEPARTMENT_OTHER): Payer: Medicare HMO | Admitting: Cardiology

## 2022-12-15 VITALS — BP 126/80 | HR 78 | Ht 65.0 in | Wt 163.0 lb

## 2022-12-15 DIAGNOSIS — I10 Essential (primary) hypertension: Secondary | ICD-10-CM

## 2022-12-15 DIAGNOSIS — Z7189 Other specified counseling: Secondary | ICD-10-CM | POA: Diagnosis not present

## 2022-12-15 DIAGNOSIS — I2510 Atherosclerotic heart disease of native coronary artery without angina pectoris: Secondary | ICD-10-CM

## 2022-12-15 DIAGNOSIS — E7800 Pure hypercholesterolemia, unspecified: Secondary | ICD-10-CM

## 2022-12-15 NOTE — Patient Instructions (Signed)
Medication Instructions:  Your physician recommends that you continue on your current medications as directed. Please refer to the Current Medication list given to you today.  *If you need a refill on your cardiac medications before your next appointment, please call your pharmacy*   Lab Work: Your physician recommends that you return for lab work within 1-2 weeks for Fasting Lipid Panel    Follow-Up: At [facility], you and your health needs are our priority.  As part of our continuing mission to provide you with exceptional heart care, we have created designated Provider Care Teams.  These Care Teams include your primary Cardiologist (physician) and Advanced Practice Providers (APPs -  Physician Assistants and Nurse Practitioners) who all work together to provide you with the care you need, when you need it.  We recommend signing up for the patient portal called "MyChart".  Sign up information is provided on this After Visit Summary.  MyChart is used to connect with patients for Virtual Visits (Telemedicine).  Patients are able to view lab/test results, encounter notes, upcoming appointments, etc.  Non-urgent messages can be sent to your provider as well.   To learn more about what you can do with MyChart, go to [email or web address].    Your next appointment:   1 year follow up with Dr. Cristal Deer

## 2022-12-15 NOTE — Progress Notes (Signed)
Cardiology Office Note:  .   Date:  12/15/2022  ID:  Gregory Phillips., DOB Aug 19, 1955, MRN [id number] PCP: Shelva Majestic, MD  [facility] Providers Cardiologist:  Jodelle Red, MD {  History of Present Illness: .   Gregory Phillips. is a 67 y.o. male  with a hx of mixed hyperlipidemia  who is seen for follow up today. I initially met him 06/25/21 as a new consult at the request of Shelva Majestic, MD for the evaluation and management of mixed hyperlipidemia and elevated coronary calcium score.   Cardiovascular risk factors: Hypertension - Since the past 1-2 years. Recently he was started on metoprolol, since then his tremors have improved. Hyperlipidemia -  On rosuvastatin for about 1 month. -Family history: No known history of heart disease. His mother had a stroke at 51 yo. -CT Calcium Score 05/2021 which showed coronary calcium score of 418. This was 9 percentile for age-, race-, and sex-matched controls.  Today: Doing well overall. Still with intermittent headaches. Blood pressures overall much better controlled, reviewed log, range 114-142/68-86. Has chronic cough, predates ARB, not on an ACEi.  Traveling a lot, walks several miles a few times/week without limitations.  ROS: Denies chest pain, shortness of breath at rest or with normal exertion. No PND, orthopnea, LE edema or unexpected weight gain. No syncope or palpitations. ROS otherwise negative except as noted.   Studies Reviewed: Marland Kitchen    EKG:       Physical Exam:   VS:  There were no vitals taken for this visit.   Wt Readings from Last 3 Encounters:  01/22/22 157 lb 3 oz (71.3 kg)  12/12/21 157 lb 1.6 oz (71.3 kg)  10/30/21 165 lb (74.8 kg)    GEN: Well nourished, well developed in no acute distress HEENT: Normal, moist mucous membranes NECK: No JVD CARDIAC: regular rhythm, normal S1 and S2, no rubs or gallops. No murmur. VASCULAR: Radial and DP pulses 2+ bilaterally. No carotid  bruits RESPIRATORY:  Clear to auscultation without rales, wheezing or rhonchi  ABDOMEN: Soft, non-tender, non-distended MUSCULOSKELETAL:  Ambulates independently SKIN: Warm and dry, no edema NEUROLOGIC:  Alert and oriented x 3. No focal neuro deficits noted. PSYCHIATRIC:  Normal affect    ASSESSMENT AND PLAN: .    Coronary calcification, consistent with nonobstructive CAD Hypercholesterolemia -calcium score 418. ETT negative for ischemia -previously on aspirin, stopped  -tolerating rosuvastatin, due for recheck. Last LDL 85, goal <70   Hypertension -reports gout flares and elevated uric acid (I cannot see labs). Not as frequent/severe as before -avoid thiazides -continue valsartan  CV risk counseling and prevention -recommend heart healthy/Mediterranean diet, with whole grains, fruits, vegetable, fish, lean meats, nuts, and olive oil. Limit salt. -recommend moderate walking, 3-5 times/week for 30-50 minutes each session. Aim for at least 150 minutes.week. Goal should be pace of 3 miles/hours, or walking 1.5 miles in 30 minutes -recommend avoidance of tobacco products. Avoid excess alcohol.    Dispo: 1 year or sooner as needed  Signed, Jodelle Red, MD   Jodelle Red, MD, PhD, [facility] [place]  [facility]  [place]  [facility] & Vascular at [facility] [address], [address] [place], [place] [address] [phone]

## 2022-12-30 ENCOUNTER — Encounter: Payer: Self-pay | Admitting: Internal Medicine

## 2023-01-01 DIAGNOSIS — M109 Gout, unspecified: Secondary | ICD-10-CM | POA: Diagnosis not present

## 2023-01-05 DIAGNOSIS — M542 Cervicalgia: Secondary | ICD-10-CM | POA: Diagnosis not present

## 2023-01-06 DIAGNOSIS — E291 Testicular hypofunction: Secondary | ICD-10-CM | POA: Diagnosis not present

## 2023-01-15 DIAGNOSIS — M8588 Other specified disorders of bone density and structure, other site: Secondary | ICD-10-CM | POA: Diagnosis not present

## 2023-01-15 DIAGNOSIS — M8580 Other specified disorders of bone density and structure, unspecified site: Secondary | ICD-10-CM | POA: Diagnosis not present

## 2023-01-26 DIAGNOSIS — E291 Testicular hypofunction: Secondary | ICD-10-CM | POA: Diagnosis not present

## 2023-02-02 ENCOUNTER — Telehealth: Payer: Self-pay

## 2023-02-02 ENCOUNTER — Ambulatory Visit (AMBULATORY_SURGERY_CENTER): Payer: Medicare HMO

## 2023-02-02 VITALS — Ht 65.0 in | Wt 168.0 lb

## 2023-02-02 DIAGNOSIS — Z800 Family history of malignant neoplasm of digestive organs: Secondary | ICD-10-CM

## 2023-02-02 DIAGNOSIS — Z860100 Personal history of colon polyps, unspecified: Secondary | ICD-10-CM

## 2023-02-02 MED ORDER — NA SULFATE-K SULFATE-MG SULF 17.5-3.13-1.6 GM/177ML PO SOLN: 1.0000 | Freq: Once | ORAL | 0 refills | Status: AC

## 2023-02-02 NOTE — Progress Notes (Signed)

## 2023-02-02 NOTE — Telephone Encounter (Signed)
Reached patient and completed PV

## 2023-02-02 NOTE — Addendum Note (Signed)
Addended by: Jaquelyn Bitter on: 02/02/2023 03:13 PM   Modules accepted: Orders

## 2023-02-02 NOTE — Telephone Encounter (Signed)
No answer left message that I would try back in 5 min

## 2023-02-10 DIAGNOSIS — R69 Illness, unspecified: Secondary | ICD-10-CM | POA: Diagnosis not present

## 2023-02-11 DIAGNOSIS — R7989 Other specified abnormal findings of blood chemistry: Secondary | ICD-10-CM | POA: Diagnosis not present

## 2023-02-11 DIAGNOSIS — E291 Testicular hypofunction: Secondary | ICD-10-CM | POA: Diagnosis not present

## 2023-02-15 ENCOUNTER — Encounter: Payer: Self-pay | Admitting: Internal Medicine

## 2023-03-02 DIAGNOSIS — E559 Vitamin D deficiency, unspecified: Secondary | ICD-10-CM | POA: Diagnosis not present

## 2023-03-02 DIAGNOSIS — M810 Age-related osteoporosis without current pathological fracture: Secondary | ICD-10-CM | POA: Diagnosis not present

## 2023-03-02 DIAGNOSIS — I10 Essential (primary) hypertension: Secondary | ICD-10-CM | POA: Diagnosis not present

## 2023-03-02 DIAGNOSIS — R7989 Other specified abnormal findings of blood chemistry: Secondary | ICD-10-CM | POA: Diagnosis not present

## 2023-03-02 DIAGNOSIS — E291 Testicular hypofunction: Secondary | ICD-10-CM | POA: Diagnosis not present

## 2023-03-03 ENCOUNTER — Encounter: Payer: Self-pay | Admitting: Internal Medicine

## 2023-03-03 ENCOUNTER — Ambulatory Visit: Payer: Medicare HMO | Admitting: Internal Medicine

## 2023-03-03 ENCOUNTER — Other Ambulatory Visit (HOSPITAL_BASED_OUTPATIENT_CLINIC_OR_DEPARTMENT_OTHER): Payer: Self-pay

## 2023-03-03 VITALS — BP 110/68 | HR 78 | Temp 97.2°F | Resp 17 | Ht 65.0 in | Wt 168.0 lb

## 2023-03-03 DIAGNOSIS — D123 Benign neoplasm of transverse colon: Secondary | ICD-10-CM

## 2023-03-03 DIAGNOSIS — Z800 Family history of malignant neoplasm of digestive organs: Secondary | ICD-10-CM | POA: Diagnosis not present

## 2023-03-03 DIAGNOSIS — Z860101 Personal history of adenomatous and serrated colon polyps: Secondary | ICD-10-CM

## 2023-03-03 DIAGNOSIS — Z1211 Encounter for screening for malignant neoplasm of colon: Secondary | ICD-10-CM

## 2023-03-03 DIAGNOSIS — K5730 Diverticulosis of large intestine without perforation or abscess without bleeding: Secondary | ICD-10-CM

## 2023-03-03 DIAGNOSIS — I10 Essential (primary) hypertension: Secondary | ICD-10-CM

## 2023-03-03 DIAGNOSIS — Z860100 Personal history of colon polyps, unspecified: Secondary | ICD-10-CM

## 2023-03-03 DIAGNOSIS — K648 Other hemorrhoids: Secondary | ICD-10-CM | POA: Diagnosis not present

## 2023-03-03 MED ORDER — SODIUM CHLORIDE 0.9 % IV SOLN: 500.0000 mL | Freq: Once | INTRAVENOUS | Status: DC

## 2023-03-03 MED ORDER — VALSARTAN 40 MG PO TABS: 40.0000 mg | ORAL_TABLET | Freq: Every day | ORAL | 3 refills | Status: AC

## 2023-03-03 MED ORDER — DEXLANSOPRAZOLE 60 MG PO CPDR: 60.0000 mg | DELAYED_RELEASE_CAPSULE | Freq: Every day | ORAL | 3 refills | Status: DC

## 2023-03-03 NOTE — Progress Notes (Unsigned)
GASTROENTEROLOGY PROCEDURE H&P NOTE   Primary Care Physician: Shelva Majestic, MD    Reason for Procedure:  History of adenomatous and sessile serrated colon polyps and family history of colon cancer  Plan:    Colonoscopy  Patient is appropriate for endoscopic procedure(s) in the ambulatory ([facility]) setting.  The nature of the procedure, as well as the risks, benefits, and alternatives were carefully and thoroughly reviewed with the patient. Ample time for discussion and questions allowed. The patient understood, was satisfied, and agreed to proceed.     HPI: Gregory Kustra. is a 67 y.o. male who presents for surveillance colonoscopy.  Medical history as below.  Tolerated the prep.  No recent chest pain or shortness of breath.  No abdominal pain today.  Past Medical History:  Diagnosis Date   Adenomatous colon polyp    Allergy    Asthma    in past   Cellulitis 2001    LLE ; [facility]   Cellulitis 2007   R elbow ; S/ P drainage by Dr Priscille Kluver   Cellulitis 2014   with sepsis   Cellulitis 08/09/2014   LLE   Diverticulosis    Eczema    Edema    Gastritis with intestinal metaplasia of stomach 08/07/2019   GERD (gastroesophageal reflux disease)    Hiatal hernia    Internal hemorrhoids    Recurrent cellulitis of lower leg 08/15/2014   Septic thrombophlebitis 08/15/2014   Testicular cancer [facility]) 2004   Dr Annabell Howells    Past Surgical History:  Procedure Laterality Date   COLONOSCOPY W/ POLYPECTOMY  05/14/2008 & 04/2012   Tubular adenoma; Dr. Sheryn Bison   FOOT FRACTURE SURGERY Left 05/2012   FRACTURE SURGERY     I & D EXTREMITY Left 2004   elbow   KNEE ARTHROPLASTY Right     Dr Thurston Hole,  for patellar instability   KNEE ARTHROSCOPY Right 1981    Dr Thurston Hole   REFRACTIVE SURGERY Bilateral 2004   SHOULDER SURGERY  07/2020   Right shoulder   testicle removed Right    pt. not 100 percent sure   TUMOR EXCISION  2005    Seminoma; Dr Annabell Howells   VIDEO  BRONCHOSCOPY N/A 11/03/2018   Procedure: VIDEO BRONCHOSCOPY WITHOUT FLUORO;  Surgeon: Leslye Peer, MD;  Location[facility] ENDOSCOPY;  Service: Cardiopulmonary;  Laterality: N/A;    Prior to Admission medications   Medication Sig Start Date End Date Taking? Authorizing Provider  alendronate (FOSAMAX) 70 MG tablet Take by mouth. 10/25/18  Yes [provider]  augmented betamethasone dipropionate (DIPROLENE-AF) 0.05 % ointment APPLY TO THE AFFECTED AREA OF ECZEMA ON THE BODY AT BEDTIME. 12/30/21  Yes Shelva Majestic, MD  desonide (DESOWEN) 0.05 % ointment APPLY TOPICALLY TO FACE TWICE DAILY AS NEEDED 07/08/21  Yes Shelva Majestic, MD  dexlansoprazole (DEXILANT) 60 MG capsule TAKE 1 CAPSULE(60 MG) BY MOUTH IN THE MORNING 03/06/21  Yes Aza Dantes, Carie Caddy, MD  rosuvastatin (CRESTOR) 20 MG tablet Take 1 tablet (20 mg total) by mouth daily. 07/03/22  Yes Jodelle Red, MD  testosterone cypionate (DEPOTESTOSTERONE CYPIONATE) 200 MG/ML injection  03/29/19  Yes [provider]  valsartan (DIOVAN) 40 MG tablet Take 1 tablet (40 mg total) by mouth daily. 03/03/23  Yes Jodelle Red, MD  allopurinol (ZYLOPRIM) 100 MG tablet Take 100 mg by mouth daily.    [provider]  Colchicine 0.6 MG CAPS as needed (gout flareups). Patient not taking: Reported on  02/02/2023    [provider]  DUPIXENT 300 MG/2ML SOSY  03/15/17   [provider]  famotidine (PEPCID) 40 MG tablet 1 tablet at bedtime Orally Once a day    [provider]  ibuprofen (ADVIL) 600 MG tablet 1 tablet with food or milk as needed Orally Two times a day Patient not taking: Reported on 03/03/2023 11/17/22   [provider]  predniSONE (DELTASONE) 20 MG tablet Take 20 mg by mouth daily as needed. 09/14/22   [provider]    Current Outpatient Medications  Medication Sig Dispense Refill   alendronate (FOSAMAX) 70 MG tablet Take by mouth.     augmented betamethasone  dipropionate (DIPROLENE-AF) 0.05 % ointment APPLY TO THE AFFECTED AREA OF ECZEMA ON THE BODY AT BEDTIME. 50 g 1   desonide (DESOWEN) 0.05 % ointment APPLY TOPICALLY TO FACE TWICE DAILY AS NEEDED 60 g 2   dexlansoprazole (DEXILANT) 60 MG capsule TAKE 1 CAPSULE(60 MG) BY MOUTH IN THE MORNING 90 capsule 2   rosuvastatin (CRESTOR) 20 MG tablet Take 1 tablet (20 mg total) by mouth daily. 90 tablet 3   testosterone cypionate (DEPOTESTOSTERONE CYPIONATE) 200 MG/ML injection      valsartan (DIOVAN) 40 MG tablet Take 1 tablet (40 mg total) by mouth daily. 90 tablet 3   allopurinol (ZYLOPRIM) 100 MG tablet Take 100 mg by mouth daily.     Colchicine 0.6 MG CAPS as needed (gout flareups). (Patient not taking: Reported on 02/02/2023)     DUPIXENT 300 MG/2ML SOSY      famotidine (PEPCID) 40 MG tablet 1 tablet at bedtime Orally Once a day     ibuprofen (ADVIL) 600 MG tablet 1 tablet with food or milk as needed Orally Two times a day (Patient not taking: Reported on 03/03/2023)     predniSONE (DELTASONE) 20 MG tablet Take 20 mg by mouth daily as needed.     Current Facility-Administered Medications  Medication Dose Route Frequency Provider Last Rate Last Admin   0.9 %  sodium chloride infusion  500 mL Intravenous Once Leilyn Frayre, Carie Caddy, MD        Allergies as of 03/03/2023   (No Known Allergies)    Family History  Problem Relation Age of Onset   Heart disease Father        dysrrhymthmia   Colon cancer Father    Liver cancer Father    Transient ischemic attack Mother        in 31s   Asthma Sister    Cancer Paternal Grandfather        stomach   Eczema Maternal Uncle    Diabetes Neg Hx    Colon polyps Neg Hx    Esophageal cancer Neg Hx    Rectal cancer Neg Hx    Stomach cancer Neg Hx     Social History   Socioeconomic History   Marital status: Married    Spouse name: Not on file   Number of children: 2   Years of education: Not on file   Highest education level: Not on file  Occupational  History   Not on file  Tobacco Use   Smoking status: Never   Smokeless tobacco: Never  Vaping Use   Vaping status: Never Used  Substance and Sexual Activity   Alcohol use: Yes    Alcohol/week: 12.0 standard drinks of alcohol    Types: 12 Cans of beer per week    Comment: week   Drug use: No  Sexual activity: Yes  Other Topics Concern   Not on file  Social History Narrative   Married. 2 living children (lost 3rd child who was a twin due to heart defect at 11 months)      Retired January 2022   Social Determinants of Corporate investment banker Strain: Not on file  Food Insecurity: Low Risk  (03/02/2023)   Received from [facility]   Hunger Vital Sign    Worried About Running Out of Food in the Last Year: Never true    Ran Out of Food in the Last Year: Never true  Transportation Needs: No Transportation Needs (03/02/2023)   Received from [facility]    In the past 12 months, has lack of reliable transportation kept you from medical appointments, meetings, work or from getting things needed for daily living? : No  Physical Activity: Not on file  Stress: Not on file  Social Connections: Not on file  Intimate Partner Violence: Not on file    Physical Exam: Vital signs in last 24 hours: @BP  135/81   Pulse 95   Temp (!) 97.2 F (36.2 C) (Temporal)   Ht 5\' 5"  (1.651 m)   Wt 168 lb (76.2 kg)   SpO2 97%   BMI 27.96 kg/m  GEN: NAD EYE: Sclerae anicteric ENT: MMM CV: Non-tachycardic Pulm: CTA b/l GI: Soft, NT/ND NEURO:  Alert & Oriented x 3   Gregory Blinks, MD [facility]  03/03/2023 4:02 PM

## 2023-03-03 NOTE — Progress Notes (Unsigned)
Pt's states no medical or surgical changes since previsit or office visit. 

## 2023-03-03 NOTE — Progress Notes (Unsigned)
Report to PACU, RN, vss, BBS= Clear.  

## 2023-03-03 NOTE — Progress Notes (Unsigned)
Called to room to assist during endoscopic procedure.  Patient ID and intended procedure confirmed with present staff. Received instructions for my participation in the procedure from the performing physician.  

## 2023-03-03 NOTE — Op Note (Signed)
[facility] Patient Name: Gregory Phillips Procedure Date: 03/03/2023 4:05 PM MRN: [id number] Endoscopist: Beverley Fiedler , MD, [id number] Age: 67 Referring MD:  Date of Birth: 1956/01/17 Gender: Male Account #: [id number] Procedure:                Colonoscopy Indications:              High risk colon cancer surveillance: Personal                            history of multiple adenomas, Family history of                            colon cancer in a first-degree relative, Last                            colonoscopy: February 2019 (TA x 1), Jan 2014 (TA x                            3, SSP x 1) Medicines:                Monitored Anesthesia Care Procedure:                Pre-Anesthesia Assessment:                           - Prior to the procedure, a History and Physical                            was performed, and patient medications and                            allergies were reviewed. The patient's tolerance of                            previous anesthesia was also reviewed. The risks                            and benefits of the procedure and the sedation                            options and risks were discussed with the patient.                            All questions were answered, and informed consent                            was obtained. Prior Anticoagulants: The patient has                            taken no anticoagulant or antiplatelet agents. ASA                            Grade Assessment: II - A patient with mild systemic  disease. After reviewing the risks and benefits,                            the patient was deemed in satisfactory condition to                            undergo the procedure.                           After obtaining informed consent, the colonoscope                            was passed under direct vision. Throughout the                            procedure, the patient's blood pressure, pulse, and                             oxygen saturations were monitored continuously. The                            Olympus Scope SN: [id number] was introduced through                            the anus and advanced to the cecum, identified by                            appendiceal orifice and ileocecal valve. The                            colonoscopy was performed without difficulty. The                            patient tolerated the procedure well. The quality                            of the bowel preparation was good. The ileocecal                            valve, appendiceal orifice, and rectum were                            photographed. Scope In: 4:10:54 PM Scope Out: 4:20:19 PM Scope Withdrawal Time: 0 hours 8 minutes 21 seconds  Total Procedure Duration: 0 hours 9 minutes 25 seconds  Findings:                 The digital rectal exam was normal.                           A single small angioectasia with typical                            arborization was found in the cecum.  A 5 mm polyp was found in the transverse colon. The                            polyp was sessile. The polyp was removed with a                            cold snare. Resection and retrieval were complete.                           Multiple medium-mouthed and small-mouthed                            diverticula were found in the sigmoid colon.                           Internal hemorrhoids were found during                            retroflexion. The hemorrhoids were small. Complications:            No immediate complications. Estimated Blood Loss:     Estimated blood loss was minimal. Impression:               - A single cecal angioectasia.                           - One 5 mm polyp in the transverse colon, removed                            with a cold snare. Resected and retrieved.                           - Mild diverticulosis in the sigmoid colon.                           - Small internal  hemorrhoids. Recommendation:           - Patient has a contact number available for                            emergencies. The signs and symptoms of potential                            delayed complications were discussed with the                            patient. Return to normal activities tomorrow.                            Written discharge instructions were provided to the                            patient.                           - Resume previous diet.                           -  Continue present medications.                           - Await pathology results.                           - Repeat colonoscopy is recommended for                            surveillance. The colonoscopy date will be                            determined after pathology results from today's                            exam become available for review. Beverley Fiedler, MD 03/03/2023 4:25:04 PM This report has been signed electronically.

## 2023-03-03 NOTE — Progress Notes (Signed)
Rx refill sent to pharmacy. 

## 2023-03-03 NOTE — Patient Instructions (Signed)
Resume all of your previous medications today as ordered.  Take your new medications 1/2 hour before you eat.  Read your discharge instructions.  YOU HAD AN ENDOSCOPIC PROCEDURE TODAY AT THE [facility]:   Refer to the procedure report that was given to you for any specific questions about what was found during the examination.  If the procedure report does not answer your questions, please call your gastroenterologist to clarify.  If you requested that your care partner not be given the details of your procedure findings, then the procedure report has been included in a sealed envelope for you to review at your convenience later.  YOU SHOULD EXPECT: Some feelings of bloating in the abdomen. Passage of more gas than usual.  Walking can help get rid of the air that was put into your GI tract during the procedure and reduce the bloating. If you had a lower endoscopy (such as a colonoscopy or flexible sigmoidoscopy) you may notice spotting of blood in your stool or on the toilet paper. If you underwent a bowel prep for your procedure, you may not have a normal bowel movement for a few days.  Please Note:  You might notice some irritation and congestion in your nose or some drainage.  This is from the oxygen used during your procedure.  There is no need for concern and it should clear up in a day or so.  SYMPTOMS TO REPORT IMMEDIATELY:  Following lower endoscopy (colonoscopy or flexible sigmoidoscopy):  Excessive amounts of blood in the stool  Significant tenderness or worsening of abdominal pains  Swelling of the abdomen that is new, acute  Fever of 100F or higher   For urgent or emergent issues, a gastroenterologist can be reached at any hour by calling [phone]. Do not use MyChart messaging for urgent concerns.    DIET:  We do recommend a small meal at first, but then you may proceed to your regular diet.  Drink plenty of fluids but you should avoid alcoholic beverages for 24  hours.  ACTIVITY:  You should plan to take it easy for the rest of today and you should NOT DRIVE or use heavy machinery until tomorrow (because of the sedation medicines used during the test).    FOLLOW UP: Our staff will call the number listed on your records the next business day following your procedure.  We will call around 7:15- 8:00 am to check on you and address any questions or concerns that you may have regarding the information given to you following your procedure. If we do not reach you, we will leave a message.     If any biopsies were taken you will be contacted by phone or by letter within the next 1-3 weeks.  Please call us at [phone] if you have not heard about the biopsies in 3 weeks.    SIGNATURES/CONFIDENTIALITY: You and/or your care partner have signed paperwork which will be entered into your electronic medical record.  These signatures attest to the fact that that the information above on your After Visit Summary has been reviewed and is understood.  Full responsibility of the confidentiality of this discharge information lies with you and/or your care-partner.

## 2023-03-04 ENCOUNTER — Telehealth: Payer: Self-pay | Admitting: *Deleted

## 2023-03-04 NOTE — Telephone Encounter (Signed)
Post procedure follow up call placed, no answer and left VM.  

## 2023-03-08 ENCOUNTER — Encounter: Payer: Self-pay | Admitting: Internal Medicine

## 2023-03-08 LAB — SURGICAL PATHOLOGY

## 2023-03-30 DIAGNOSIS — E291 Testicular hypofunction: Secondary | ICD-10-CM | POA: Diagnosis not present

## 2023-04-01 DIAGNOSIS — M25562 Pain in left knee: Secondary | ICD-10-CM | POA: Diagnosis not present

## 2023-04-01 DIAGNOSIS — M1991 Primary osteoarthritis, unspecified site: Secondary | ICD-10-CM | POA: Diagnosis not present

## 2023-04-01 DIAGNOSIS — M79641 Pain in right hand: Secondary | ICD-10-CM | POA: Diagnosis not present

## 2023-04-01 DIAGNOSIS — Z6827 Body mass index (BMI) 27.0-27.9, adult: Secondary | ICD-10-CM | POA: Diagnosis not present

## 2023-04-01 DIAGNOSIS — E663 Overweight: Secondary | ICD-10-CM | POA: Diagnosis not present

## 2023-04-01 DIAGNOSIS — M79642 Pain in left hand: Secondary | ICD-10-CM | POA: Diagnosis not present

## 2023-04-01 DIAGNOSIS — M25561 Pain in right knee: Secondary | ICD-10-CM | POA: Diagnosis not present

## 2023-04-01 DIAGNOSIS — M109 Gout, unspecified: Secondary | ICD-10-CM | POA: Diagnosis not present

## 2023-04-07 DIAGNOSIS — M8000XG Age-related osteoporosis with current pathological fracture, unspecified site, subsequent encounter for fracture with delayed healing: Secondary | ICD-10-CM | POA: Diagnosis not present

## 2023-04-07 DIAGNOSIS — E7800 Pure hypercholesterolemia, unspecified: Secondary | ICD-10-CM | POA: Diagnosis not present

## 2023-04-07 DIAGNOSIS — E291 Testicular hypofunction: Secondary | ICD-10-CM | POA: Diagnosis not present

## 2023-04-07 DIAGNOSIS — E559 Vitamin D deficiency, unspecified: Secondary | ICD-10-CM | POA: Diagnosis not present

## 2023-04-07 DIAGNOSIS — I10 Essential (primary) hypertension: Secondary | ICD-10-CM | POA: Diagnosis not present

## 2023-04-07 LAB — HEPATIC FUNCTION PANEL
ALT: 21 U/L (ref 10–40)
AST: 20 (ref 14–40)
Alkaline Phosphatase: 63 (ref 25–125)

## 2023-04-07 LAB — TESTOSTERONE: Testosterone: 458.3

## 2023-04-07 LAB — BASIC METABOLIC PANEL
BUN: 15 (ref 4–21)
CO2: 32 — AB (ref 13–22)
Chloride: 100 (ref 99–108)
Creatinine: 0.8 (ref 0.6–1.3)
Potassium: 4.3 meq/L (ref 3.5–5.1)
Sodium: 139 (ref 137–147)

## 2023-04-07 LAB — LIPID PANEL
Cholesterol: 125 (ref 0–200)
HDL: 61 (ref 35–70)
LDL Cholesterol: 45
Triglycerides: 107 (ref 40–160)

## 2023-04-07 LAB — COMPREHENSIVE METABOLIC PANEL: Albumin: 3.9 (ref 3.5–5.0)

## 2023-04-08 ENCOUNTER — Encounter (HOSPITAL_BASED_OUTPATIENT_CLINIC_OR_DEPARTMENT_OTHER): Payer: Self-pay

## 2023-04-08 NOTE — Telephone Encounter (Signed)
Labs printed and message printed out for MD to review.

## 2023-04-21 DIAGNOSIS — E291 Testicular hypofunction: Secondary | ICD-10-CM | POA: Diagnosis not present

## 2023-04-21 DIAGNOSIS — R7989 Other specified abnormal findings of blood chemistry: Secondary | ICD-10-CM | POA: Diagnosis not present

## 2023-05-14 DIAGNOSIS — M109 Gout, unspecified: Secondary | ICD-10-CM | POA: Diagnosis not present

## 2023-05-14 DIAGNOSIS — E291 Testicular hypofunction: Secondary | ICD-10-CM | POA: Diagnosis not present

## 2023-05-18 NOTE — Telephone Encounter (Unsigned)
 Copied from CRM [id number]. Topic: General - Other >> May 18, 2023  1:59 PM Aletta Edouard wrote: Reason for CRM: patient stated he is unable to click on dr hunter in  [place]   says dr hunter name is grayed out

## 2023-05-24 DIAGNOSIS — L2089 Other atopic dermatitis: Secondary | ICD-10-CM | POA: Diagnosis not present

## 2023-05-24 DIAGNOSIS — B078 Other viral warts: Secondary | ICD-10-CM | POA: Diagnosis not present

## 2023-05-24 DIAGNOSIS — L814 Other melanin hyperpigmentation: Secondary | ICD-10-CM | POA: Diagnosis not present

## 2023-05-28 DIAGNOSIS — H01004 Unspecified blepharitis left upper eyelid: Secondary | ICD-10-CM | POA: Diagnosis not present

## 2023-05-28 DIAGNOSIS — H524 Presbyopia: Secondary | ICD-10-CM | POA: Diagnosis not present

## 2023-05-28 DIAGNOSIS — B880 Other acariasis: Secondary | ICD-10-CM | POA: Diagnosis not present

## 2023-05-28 DIAGNOSIS — H01001 Unspecified blepharitis right upper eyelid: Secondary | ICD-10-CM | POA: Diagnosis not present

## 2023-06-01 DIAGNOSIS — M1991 Primary osteoarthritis, unspecified site: Secondary | ICD-10-CM | POA: Diagnosis not present

## 2023-06-01 DIAGNOSIS — E663 Overweight: Secondary | ICD-10-CM | POA: Diagnosis not present

## 2023-06-01 DIAGNOSIS — M79641 Pain in right hand: Secondary | ICD-10-CM | POA: Diagnosis not present

## 2023-06-01 DIAGNOSIS — M79642 Pain in left hand: Secondary | ICD-10-CM | POA: Diagnosis not present

## 2023-06-01 DIAGNOSIS — M109 Gout, unspecified: Secondary | ICD-10-CM | POA: Diagnosis not present

## 2023-06-01 DIAGNOSIS — Z6827 Body mass index (BMI) 27.0-27.9, adult: Secondary | ICD-10-CM | POA: Diagnosis not present

## 2023-06-01 DIAGNOSIS — M25562 Pain in left knee: Secondary | ICD-10-CM | POA: Diagnosis not present

## 2023-06-01 DIAGNOSIS — M25561 Pain in right knee: Secondary | ICD-10-CM | POA: Diagnosis not present

## 2023-06-01 DIAGNOSIS — Z0100 Encounter for examination of eyes and vision without abnormal findings: Secondary | ICD-10-CM | POA: Diagnosis not present

## 2023-06-03 ENCOUNTER — Encounter: Payer: Self-pay | Admitting: Family Medicine

## 2023-06-03 ENCOUNTER — Ambulatory Visit (INDEPENDENT_AMBULATORY_CARE_PROVIDER_SITE_OTHER): Payer: Medicare HMO | Admitting: Family Medicine

## 2023-06-03 VITALS — BP 120/62 | HR 70 | Temp 97.1°F | Ht 65.0 in | Wt 166.4 lb

## 2023-06-03 DIAGNOSIS — R519 Headache, unspecified: Secondary | ICD-10-CM

## 2023-06-03 DIAGNOSIS — Z131 Encounter for screening for diabetes mellitus: Secondary | ICD-10-CM | POA: Diagnosis not present

## 2023-06-03 DIAGNOSIS — I10 Essential (primary) hypertension: Secondary | ICD-10-CM | POA: Diagnosis not present

## 2023-06-03 DIAGNOSIS — Z79899 Other long term (current) drug therapy: Secondary | ICD-10-CM | POA: Diagnosis not present

## 2023-06-03 DIAGNOSIS — R42 Dizziness and giddiness: Secondary | ICD-10-CM | POA: Diagnosis not present

## 2023-06-03 DIAGNOSIS — Z125 Encounter for screening for malignant neoplasm of prostate: Secondary | ICD-10-CM | POA: Diagnosis not present

## 2023-06-03 DIAGNOSIS — G4452 New daily persistent headache (NDPH): Secondary | ICD-10-CM

## 2023-06-03 DIAGNOSIS — E663 Overweight: Secondary | ICD-10-CM

## 2023-06-03 DIAGNOSIS — E782 Mixed hyperlipidemia: Secondary | ICD-10-CM | POA: Diagnosis not present

## 2023-06-03 MED ORDER — ZOLPIDEM TARTRATE 10 MG PO TABS
10.0000 mg | ORAL_TABLET | Freq: Every evening | ORAL | 2 refills | Status: DC | PRN
Start: 2023-06-03 — End: 2023-12-13

## 2023-06-03 NOTE — Patient Instructions (Addendum)
 You are eligible to schedule your annual wellness visit with our nurse specialist Inetta Fermo.  Please consider scheduling this before you leave today  We have placed a referral for you today to neurology with [place]- please call their # if you do not hear within a week (may be listed below or you may see mychart message within a few days with #).    On days you take Ambien- no alcohol on those days and then no driving at least 8 hours afterwards  Recommended follow up: Return in about 6 months (around 12/04/2023) for physical or sooner if needed.Schedule b4 you leave.

## 2023-06-03 NOTE — Progress Notes (Signed)
 Phone [phone] In person visit   Subjective:   Gregory Phillips. is a 68 y.o. year old very pleasant male patient who presents for/with See problem oriented charting Chief Complaint  Patient presents with   Headache    Pt here to f/u on headaches states they are a little better   Past Medical History-  Patient Active Problem List   Diagnosis Date Noted   Hypogonadism male 11/09/2018    Priority: High   Osteoporosis 06/29/2017    Priority: High   Recurrent cellulitis of lower leg 08/15/2014    Priority: High   Atopic eczema 03/21/2007    Priority: High   Essential hypertension 03/07/2021    Priority: Medium    Insomnia 06/06/2020    Priority: Medium    Essential tremor 06/06/2020    Priority: Medium    Hiatal hernia 06/06/2020    Priority: Medium    Gout 11/24/2018    Priority: Medium    Allergic reaction 11/09/2018    Priority: Medium    Chronic cough 09/16/2018    Priority: Medium    Lymphedema     Priority: Medium    Other abnormal glucose 08/13/2013    Priority: Medium    Asthma 03/28/2009    Priority: Medium    History of testicular mass- Seminoma of descended right testis 03/21/2007    Priority: Medium    HYPERLIPIDEMIA 03/21/2007    Priority: Medium    Metatarsal stress fracture of right foot 08/05/2017    Priority: Low   Erectile dysfunction 12/09/2015    Priority: Low   GERD (gastroesophageal reflux disease) 12/09/2015    Priority: Low   Hair loss 08/23/2014    Priority: Low   Septic thrombophlebitis 08/15/2014    Priority: Low   History of adenomatous polyp of colon 03/28/2009    Priority: Low   Habitual alcohol use 03/07/2021   Eczema 06/29/2012    Medications- reviewed and updated Current Outpatient Medications  Medication Sig Dispense Refill   alendronate (FOSAMAX) 70 MG tablet Take by mouth.     allopurinol (ZYLOPRIM) 300 MG tablet Take 300 mg by mouth daily. Sees rheumatology     augmented betamethasone dipropionate  (DIPROLENE-AF) 0.05 % ointment APPLY TO THE AFFECTED AREA OF ECZEMA ON THE BODY AT BEDTIME. 50 g 1   Colchicine 0.6 MG CAPS as needed (gout flareups).     desonide (DESOWEN) 0.05 % ointment APPLY TOPICALLY TO FACE TWICE DAILY AS NEEDED 60 g 2   dexlansoprazole (DEXILANT) 60 MG capsule Take 1 capsule (60 mg total) by mouth daily. 90 capsule 3   DUPIXENT 300 MG/2ML SOSY      famotidine (PEPCID) 40 MG tablet 1 tablet at bedtime Orally Once a day     ibuprofen (ADVIL) 600 MG tablet      predniSONE (DELTASONE) 20 MG tablet Take 20 mg by mouth daily as needed.     rosuvastatin (CRESTOR) 20 MG tablet Take 1 tablet (20 mg total) by mouth daily. 90 tablet 3   testosterone cypionate (DEPOTESTOSTERONE CYPIONATE) 200 MG/ML injection      valsartan (DIOVAN) 40 MG tablet Take 1 tablet (40 mg total) by mouth daily. 90 tablet 3   zolpidem (AMBIEN) 10 MG tablet Take 1 tablet (10 mg total) by mouth at bedtime as needed for sleep. 15 tablet 2   No current facility-administered medications for this visit.     Objective:  BP 120/62   Pulse 70   Temp (!) 97.1 F (36.2  C)   Ht 5\' 5"  (1.651 m)   Wt 166 lb 6.4 oz (75.5 kg)   SpO2 97%   BMI 27.69 kg/m  Gen: NAD, resting comfortably CV: RRR no murmurs rubs or gallops Lungs: CTAB no crackles, wheeze, rhonchi Abdomen: soft/nontender/nondistended/normal bowel sounds. No rebound or guarding.  Ext: no edema Skin: warm, dry Neuro: CN II-XII intact, sensation and reflexes normal throughout, 5/5 muscle strength in bilateral upper and lower extremities. Normal finger to nose. Normal rapid alternating movements. No pronator drift. Normal romberg. Normal gait.      Assessment and Plan   #labs reassuring with endocrine on 04/07/23 available in care everywhere  # Headaches/unsteadiness S: April 17 2021 I saw patient and he reported disequilibrium particular with pushing a space with walking-was feeling lightheaded and disoriented.  Headaches did not increase with  exertion.  Originally thought could have relation to his blood pressure but once blood pressure was controlled there was no improvement. - Also reported top of head of chronic pressure on 4 out of 10 starting on October 2022 and had similar symptoms back to 2017 that later resolved and did not have disequilibrium at that time - We ordered an MRI of the brain which was largely reassuring other than likely chronic microvascular ischemia  No facial or extremity weakness. No slurred words or trouble swallowing. no blurry vision or double vision. No paresthesias. No confusion or word finding difficulties.   Today reports headache(s) pain has improved perhaps 2/10 when occurs once a day for an hour and goes away on its own- wonders if environment contributes - if stressful situation like around a railing- still gets unsteady feeling but no clear vertigo - that's about the same- not worsening with exertion at this point thankfully.  A/P: with persistence even though improved he is agreeable to neurology referral which was placed today.     #hypertension S: medication: Metoprolol 50 mg extended release, valsartan 40 mg BP Readings from Last 3 Encounters:  06/03/23 120/62  03/03/23 110/68  12/15/22 126/80  - looks good at home too A/P: stable- continue current medicines    #hyperlipidemia with CT calcium score March 2023 of 418 83rd percentile  S: Medication:Rosuvastatin 20 mg daily -Exercise tolerance test negative for ischemia after CT calcium scoring  A/P: LDL under 50 on last check- continue current medications- this also is the target goal given plaque noted on heart  # GERD S:Medication: Dexilant 60 mg A/P: through Dr. Terri Piedra- reasonable control   # Low testosterone-follows with Dr. Corwin Levins with [facility] # Osteoporosis S: Medication: Testosterone injection 0.5 mL every 2 weeks, Fosamax 70 mg weekly -Patient with associated osteoporosis managed by endocrinology with worst  T-score in 2019 and -2.9 at the spine.  Started on alendronate after July 2020 DEXA showed worsening A/P: low testosterone appropriately treated- doing well with that Osteoporosis improving- continue current medications - on alendronate - and drinks milk- advised to make sure to get 1000 units of vitamin D per day and 1200mg  calcium- he reports was sent in high dose vitamin D weekly- hell try to take as level was at 23.1 a month ago -PSA monitored annually by [facility] and 1.02 9 months ago   #Gout-follows with rheumatology S: Medication: Allopurinol 300 mg As needed: prednisone for flares A/P:reports last uric acid 3.9 and no flares-hold off on recheck    # History of testicular cancer-previously seen by Dr. Myna Hidalgo but released since before 2020- recommended monthly self exams   #  Eczema-on Dupixent through Dr. Yetta Barre dermatology .  We have prescribed Diprolene for his body and desonide for his face in the past    #mild AST elevation in February with rheumatology labs- his alcohol only at 2 a day so encouraged could consider lower for liver health  #Insomnia- Ambien 10 mg daily once or twice a week helpful  -avoid alcohol on nights he takes  Recommended follow up: Return in about 6 months (around 12/04/2023) for physical or sooner if needed.Schedule b4 you leave.  Lab/Order associations:   ICD-10-CM   1. Essential hypertension  I10     2. Mixed hyperlipidemia  E78.2     3. Headache, new daily persistent ([facility])  G44.52 Ambulatory referral to Neurology    4. Pressure in head  R51.9 Ambulatory referral to Neurology    5. High risk medication use  Z79.899     6. Screening for diabetes mellitus  Z13.1     7. Overweight  E66.3     8. Screening for prostate cancer  Z12.5     9. Dysequilibrium  R42 Ambulatory referral to Neurology      Meds ordered this encounter  Medications   zolpidem (AMBIEN) 10 MG tablet    Sig: Take 1 tablet (10 mg total) by mouth at bedtime as needed for  sleep.    Dispense:  15 tablet    Refill:  2   Return precautions advised.  Tana Conch, MD

## 2023-06-17 DIAGNOSIS — E291 Testicular hypofunction: Secondary | ICD-10-CM | POA: Diagnosis not present

## 2023-06-18 ENCOUNTER — Encounter: Payer: Self-pay | Admitting: Neurology

## 2023-07-03 ENCOUNTER — Other Ambulatory Visit (HOSPITAL_BASED_OUTPATIENT_CLINIC_OR_DEPARTMENT_OTHER): Payer: Self-pay | Admitting: Cardiology

## 2023-07-15 DIAGNOSIS — R7989 Other specified abnormal findings of blood chemistry: Secondary | ICD-10-CM | POA: Diagnosis not present

## 2023-07-15 DIAGNOSIS — E291 Testicular hypofunction: Secondary | ICD-10-CM | POA: Diagnosis not present

## 2023-07-22 DIAGNOSIS — B078 Other viral warts: Secondary | ICD-10-CM | POA: Diagnosis not present

## 2023-07-22 DIAGNOSIS — D3613 Benign neoplasm of peripheral nerves and autonomic nervous system of lower limb, including hip: Secondary | ICD-10-CM | POA: Diagnosis not present

## 2023-07-22 DIAGNOSIS — D485 Neoplasm of uncertain behavior of skin: Secondary | ICD-10-CM | POA: Diagnosis not present

## 2023-08-09 DIAGNOSIS — L814 Other melanin hyperpigmentation: Secondary | ICD-10-CM | POA: Diagnosis not present

## 2023-08-09 DIAGNOSIS — M542 Cervicalgia: Secondary | ICD-10-CM | POA: Diagnosis not present

## 2023-08-09 DIAGNOSIS — S46011D Strain of muscle(s) and tendon(s) of the rotator cuff of right shoulder, subsequent encounter: Secondary | ICD-10-CM | POA: Diagnosis not present

## 2023-08-11 DIAGNOSIS — E291 Testicular hypofunction: Secondary | ICD-10-CM | POA: Diagnosis not present

## 2023-08-11 NOTE — Progress Notes (Unsigned)
 Initial neurology clinic note  Gregory Phillips. MRN: [id number] DOB: [birth year]  Referring provider: Almira Jaeger, MD  Primary care provider: Almira Jaeger, MD  Reason for consult:  headaches  Subjective:  This is Mr. Gregory Phillips., a 68 y.o. left-handed male with a medical history of HTN, HLD, gout, OA, testicular cancer who presents to neurology clinic with headaches. The patient is accompanied alone today.  Patient has had symptoms for about 5 years. He notices symptoms more when he is walking over a bridge. He will feel like he is swaying but he is not. Almost like he got off a boat that was rocking. He did not previously have fear of heights, but has noticed this more. He has difficulties with escalators, boats, or roller coasters. Turning his head does not make him dizzy, but he will occasionally notice his vision will have to catch up when turning his head quickly.  He also has noticed the pressure in his head. This has been present longer than the disequilibrium. It is in the forehead and top of the head and occasionally in the back of his head. He feels like he has a suction cup on his head pulling up. When he is in the shower washing his hair and has eyes closed, he will have to be careful to not lose his balance. This has been constant for at least 1.5 to 2 years. He rates the discomfort from 3-5/10. He denies any photophobia, phonophobia, or nausea or vomiting.  Initially symptoms were thought to potentially be BP related, but with better control, symptoms have not improved.  He denies any numbness, tingling, or weakness in arms or legs. He did have right shoulder surgery 2 years ago and still struggles with pain in the shoulder. He denies significant neck pain or stiffness. He does weekly PT with dry needling for the right shoulder currently (neck and back). This has helped the pain.  He denies any constitutional symptoms including fevers, night sweats, or  unintended weight loss.  Smoker: no Caffiene use: 2 cups of coffee per day EtOH use: 2 drinks per day Restrictive diet: no Family history of neurologic disease: no  He has never been on medications for headaches or dizziness.   MEDICATIONS:  Outpatient Encounter Medications as of 08/19/2023  Medication Sig   alendronate (FOSAMAX) 70 MG tablet Take by mouth.   allopurinol  (ZYLOPRIM ) 300 MG tablet Take 300 mg by mouth daily. Sees rheumatology   augmented betamethasone  dipropionate (DIPROLENE -AF) 0.05 % ointment APPLY TO THE AFFECTED AREA OF ECZEMA ON THE BODY AT BEDTIME.   desonide  (DESOWEN ) 0.05 % ointment APPLY TOPICALLY TO FACE TWICE DAILY AS NEEDED   dexlansoprazole  (DEXILANT ) 60 MG capsule Take 1 capsule (60 mg total) by mouth daily.   DUPIXENT 300 MG/2ML SOSY    ibuprofen (ADVIL) 600 MG tablet    nortriptyline (PAMELOR) 10 MG capsule Take 1 capsule (10 mg total) by mouth at bedtime.   rosuvastatin  (CRESTOR ) 20 MG tablet TAKE 1 TABLET(20 MG) BY MOUTH DAILY   testosterone  cypionate (DEPOTESTOSTERONE CYPIONATE) 200 MG/ML injection    valsartan  (DIOVAN ) 40 MG tablet Take 1 tablet (40 mg total) by mouth daily.   zolpidem  (AMBIEN ) 10 MG tablet Take 1 tablet (10 mg total) by mouth at bedtime as needed for sleep.   Colchicine 0.6 MG CAPS as needed (gout flareups). (Patient not taking: Reported on 08/19/2023)   famotidine  (PEPCID ) 40 MG tablet 1 tablet at bedtime Orally Once a  day (Patient not taking: Reported on 08/19/2023)   predniSONE (DELTASONE) 20 MG tablet Take 20 mg by mouth daily as needed. (Patient not taking: Reported on 08/19/2023)   No facility-administered encounter medications on file as of 08/19/2023.    PAST MEDICAL HISTORY: Past Medical History:  Diagnosis Date   Adenomatous colon polyp    Allergy    Asthma    in past   Cellulitis 2001    LLE ; [facility]   Cellulitis 2007   R elbow ; S/ P drainage by Dr Arthor Billet   Cellulitis 2014   with sepsis    Cellulitis 08/09/2014   LLE   Diverticulosis    Eczema    Edema    Gastritis with intestinal metaplasia of stomach 08/07/2019   GERD (gastroesophageal reflux disease)    Hiatal hernia    Internal hemorrhoids    Recurrent cellulitis of lower leg 08/15/2014   Septic thrombophlebitis 08/15/2014   Testicular cancer [facility]) 2004   Dr Inga Manges    PAST SURGICAL HISTORY: Past Surgical History:  Procedure Laterality Date   COLONOSCOPY W/ POLYPECTOMY  05/14/2008 & 04/2012   Tubular adenoma; Dr. Darryle Ends   FOOT FRACTURE SURGERY Left 05/2012   FRACTURE SURGERY     I & D EXTREMITY Left 2004   elbow   KNEE ARTHROPLASTY Right     Dr Jinger Mount,  for patellar instability   KNEE ARTHROSCOPY Right 1981    Dr Jinger Mount   REFRACTIVE SURGERY Bilateral 2004   SHOULDER SURGERY  07/2020   Right shoulder   testicle removed Right    pt. not 100 percent sure   TUMOR EXCISION  2005    Seminoma; Dr Inga Manges   VIDEO BRONCHOSCOPY N/A 11/03/2018   Procedure: VIDEO BRONCHOSCOPY WITHOUT FLUORO;  Surgeon: Denson Flake, MD;  [facility] ENDOSCOPY;  Service: Cardiopulmonary;  Laterality: N/A;    ALLERGIES: No Known Allergies  FAMILY HISTORY: Family History  Problem Relation Age of Onset   Heart disease Father        dysrrhymthmia   Colon cancer Father    Liver cancer Father    Transient ischemic attack Mother        in 61s   Asthma Sister    Cancer Paternal Grandfather        stomach   Eczema Maternal Uncle    Diabetes Neg Hx    Colon polyps Neg Hx    Esophageal cancer Neg Hx    Rectal cancer Neg Hx    Stomach cancer Neg Hx     SOCIAL HISTORY: Social History   Tobacco Use   Smoking status: Never   Smokeless tobacco: Never  Vaping Use   Vaping status: Never Used  Substance Use Topics   Alcohol use: Yes    Alcohol/week: 14.0 standard drinks of alcohol    Types: 14 Cans of beer per week    Comment: week   Drug use: No   Social History   Social History Narrative   Married. 2 living  children (lost 3rd child who was a twin due to heart defect at 53 months)      Retired January 2022   Are you right handed or left handed? Left   Are you currently employed ?    What is your current occupation? retired   Do you live at home alone?   Who lives with you? wife   What type of home do you live in: 1 story or 2 story?  three    Caffeine 1-3 a day    Objective:  Vital Signs:  BP 131/79   Pulse 83   Ht 5\' 5"  (1.651 m)   Wt 167 lb (75.8 kg)   SpO2 97%   BMI 27.79 kg/m   General: No acute distress.  Patient appears well-groomed.   Head:  Normocephalic/atraumatic Eyes:  fundi examined, disc margins clear, no obvious papilledema Neck: supple, tightness of right paraspinal/trapezius muscles, reduced range of motion Lungs: Non-labored breathing on room air   Neurological Exam: Mental status: alert and oriented, speech fluent and not dysarthric, language intact.  Cranial nerves: CN I: not tested CN II: pupils equal, round and reactive to light, visual fields intact CN III, IV, VI:  full range of motion, no nystagmus, no ptosis CN V: facial sensation intact. CN VII: upper and lower face symmetric CN VIII: hearing intact CN IX, X: uvula midline CN XI: sternocleidomastoid and trapezius muscles intact CN XII: tongue midline  Bulk & Tone: normal, no fasciculations. Motor:  muscle strength 5/5 throughout Deep Tendon Reflexes:  2+ throughout.   Sensation:  Pinprick sensation intact. Finger to nose testing:  Without dysmetria.   Gait:  Normal station and stride.  Romberg negative.   Labs and Imaging review: External labs: 04/17/23: TSH wnl Testosterone  wnl Vit D: 23.1 Lipid panel: tChol 125, LDL 45, TG107 CMP unremarkable CBC unremarkable  Imaging/Procedures: MRI brain wo contrast (05/08/21 for vertigo and HA): FINDINGS: Brain: Cerebral volume appears normal for age. Mild multifocal T2 FLAIR hyperintense signal abnormality within the cerebral white matter,  nonspecific but most often secondary to chronic small vessel ischemia.   There is no acute infarct.   No evidence of an intracranial mass.   No chronic intracranial blood products.   No extra-axial fluid collection.   No midline shift.   Vascular: Maintained flow voids within the proximal large arterial vessels.   Skull and upper cervical spine: No focal suspicious marrow lesion.   Sinuses/Orbits: Visualized orbits show no acute finding. Mild mucosal thickening within the bilateral ethmoid sinuses. Small mucous retention cyst within the right sphenoid sinus. Small mucous retention cyst within the left maxillary sinus.   IMPRESSION: No evidence of acute intracranial abnormality.   Mild multifocal T2 FLAIR hyperintense signal abnormality within the cerebral white matter, nonspecific but most often secondary to chronic small vessel ischemia. These signal changes have slightly progressed from the prior brain MRI of 02/23/2016.   Otherwise unremarkable non-contrast MRI appearance of the brain for age.   Paranasal sinus disease, as described.  Assessment/Plan:  Ora Mcnatt. is a 68 y.o. male who presents for evaluation of head pressure and disequilibrium. He has a relevant medical history of HTN, HLD, gout, OA, testicular cancer. His neurological examination is essentially normal today. He does have tightness of his neck, particularly on the right. Available diagnostic data is significant for MRI brain that showed chronic microvascular ischemia in 2023 that had progressed from 2017. The etiology of patient's symptoms is unclear. Neck pain can cause dizziness like sensation and headaches, so PT and dry needling patient is already doing could help. This could be new daily persistent headache as well, so I will trial nortriptyline to see if this helps with symptoms. His examination is reassuring but given the changes seen on last MRI brain, a repeat to ensure stability is  reasonable.  PLAN: -Blood work: B1, B12 -Repeat MRI brain w/wo contrast -Continue PT and dry needling of neck and shoulder -Nortriptyline 10  mg at bedtime -Vitamin D  1000 international units daily  -Return to clinic in ~3 months  The impression above as well as the plan as outlined below were extensively discussed with the patient who voiced understanding. All questions were answered to their satisfaction.  The patient was counseled on pertinent fall precautions per the printed material provided today, and as noted under the "Patient Instructions" section below.  When available, results of the above investigations and possible further recommendations will be communicated to the patient via telephone/MyChart. Patient to call office if not contacted after expected testing turnaround time.   Total time spent reviewing records, interview, history/exam, documentation, and coordination of care on day of encounter:  60 min   Thank you for allowing me to participate in patient's care.  If I can answer any additional questions, I would be pleased to do so.  Rommie Coats, MD   CC: Arlene Ben Saverio Curling, MD [address] [place] [address]  CC: Referring provider: Almira Jaeger, MD [address] [place],  [place] [address]

## 2023-08-15 IMAGING — MR MR HEAD W/O CM
11 series · 48 of 48 positions shown · non-contrast
Comparison: Brain MRI 02/23/2016.

CLINICAL DATA: Provided history: Vertigo. Headache, new daily
persistent. Headache, new or worsening. Additional history provided
by scanning technologist: Patient reports head pressure/confusion
for 2 months, history of testicular cancer.

EXAM:
MRI HEAD WITHOUT CONTRAST
TECHNIQUE: Multiplanar, multiecho pulse sequences of the brain and surrounding
structures were obtained without intravenous contrast.

[Series 5: T1 · sagittal · 4.0mm · 0.75mm/px · 2 of 31 slices shown (1 of 2)]
[im 1/31]
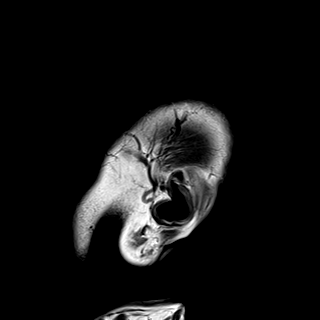
[im 31/31]
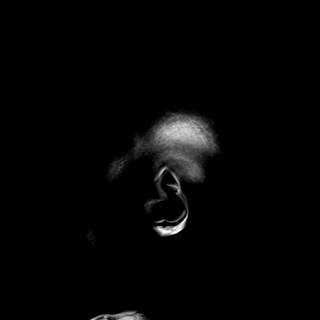

[Series 6: DWI · axial · 3.0mm · 0.94mm/px · z∈[-64,+76]mm · 10 of 160 slices shown (1 of 3)]
[im 1/160]
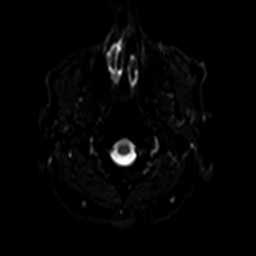
[im 18/160]
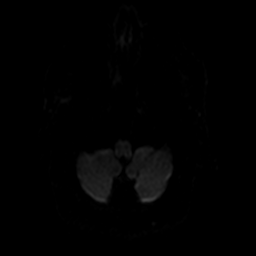
[im 36/160]
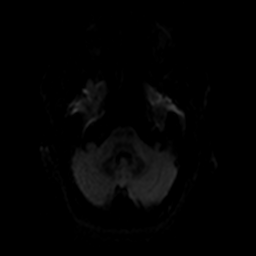
[im 54/160]
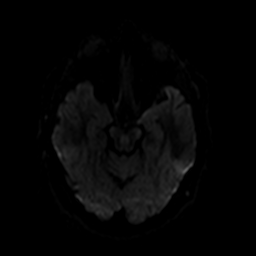
[im 71/160]
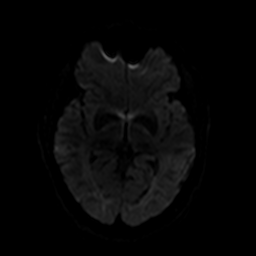
[im 89/160]
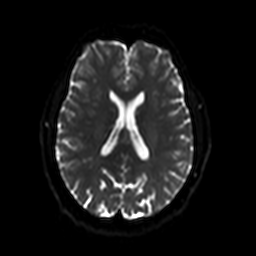
[im 107/160]
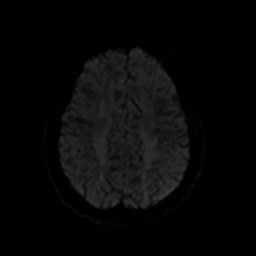
[im 124/160]
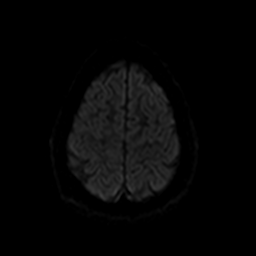
[im 142/160]
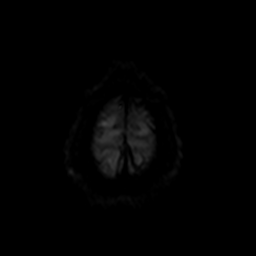
[im 160/160]
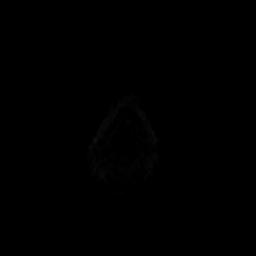

[Series 7: ax dwi_tracew · axial · 3.0mm · 0.94mm/px · z∈[-64,+76]mm · 5 of 80 slices shown]
[im 1/80]
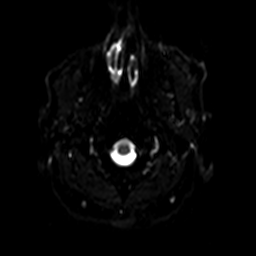
[im 20/80]
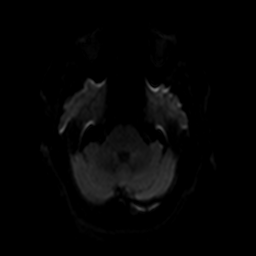
[im 40/80]
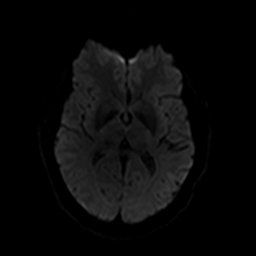
[im 60/80]
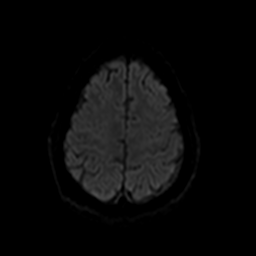
[im 80/80]
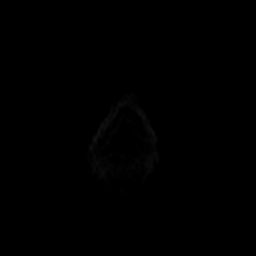

[Series 8: ax dwi_adc · axial · 3.0mm · 0.94mm/px · z∈[-64,+76]mm · 3 of 39 slices shown]
[im 1/39]
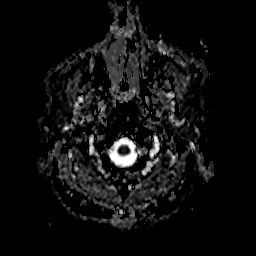
[im 20/39]
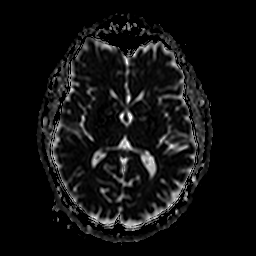
[im 39/39]
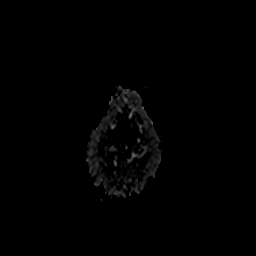

[Series 9: DWI · coronal · 5.0mm · 1.44mm/px · 4 of 64 slices shown (2 of 3)]
[im 1/64]
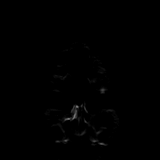
[im 22/64]
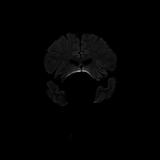
[im 43/64]
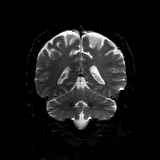
[im 64/64]
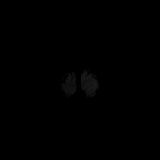

[Series 10: DWI · coronal · 5.0mm · 1.44mm/px · 2 of 32 slices shown (3 of 3)]
[im 1/32]
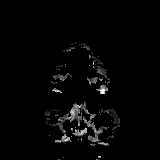
[im 32/32]
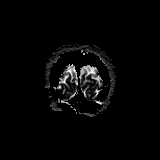

[Series 11: T2 · axial · 4.0mm · 0.36mm/px · z∈[-55,+79]mm · 2 of 27 slices shown (1 of 2)]
[im 1/27]
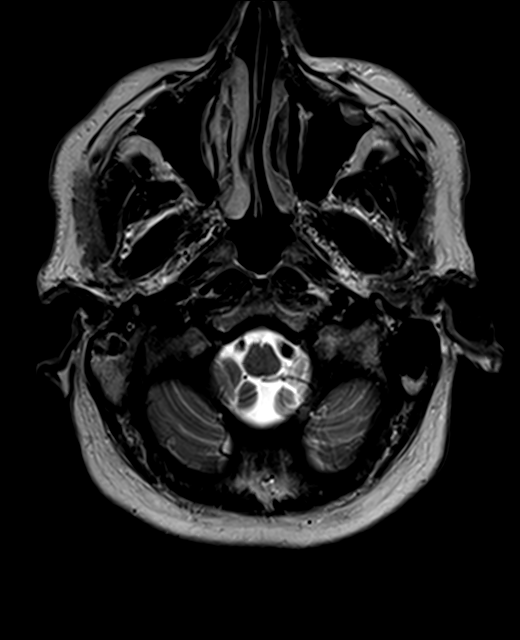
[im 27/27]
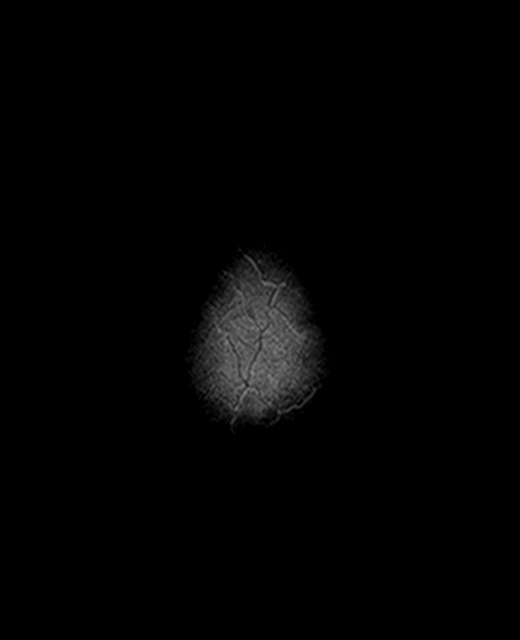

[Series 12: FLAIR · axial · 3.0mm · 0.72mm/px · z∈[-63,+85]mm · 2 of 26 slices shown]
[im 1/26]
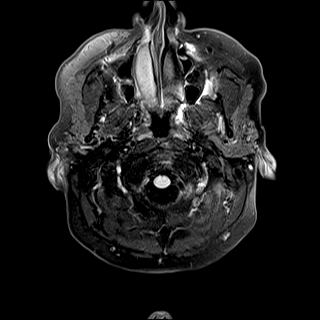
[im 26/26]
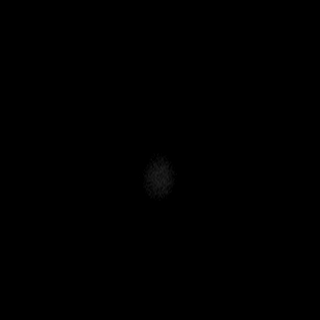

[Series 13: swi_images · axial · 1.5mm · 0.90mm/px · z∈[-58,+83]mm · 6 of 96 slices shown]
[im 1/96]
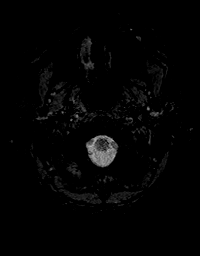
[im 20/96]
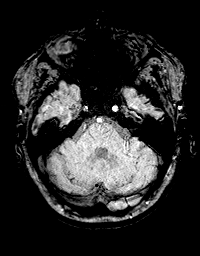
[im 39/96]
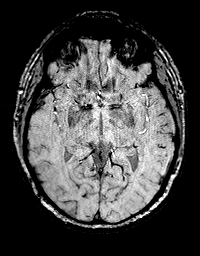
[im 58/96]
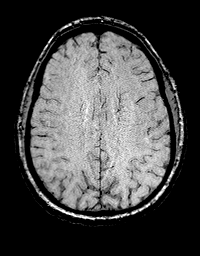
[im 77/96]
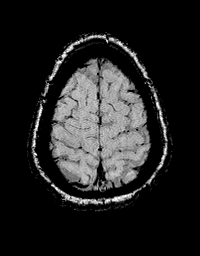
[im 96/96]
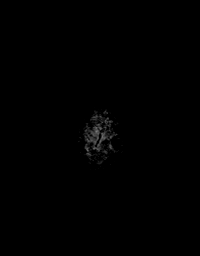

[Series 15: T1 · axial · 1.0mm · 0.94mm/px · z∈[-67,+88]mm · 10 of 157 slices shown (2 of 2)]
[im 1/157]
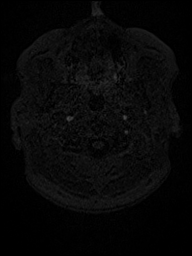
[im 18/157]
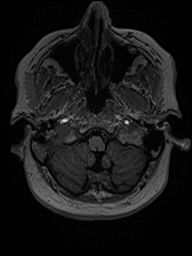
[im 35/157]
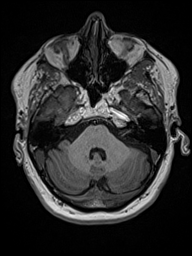
[im 53/157]
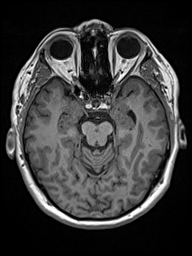
[im 70/157]
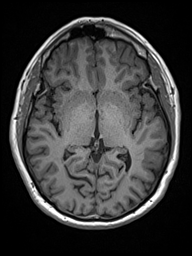
[im 87/157]
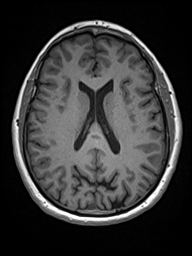
[im 105/157]
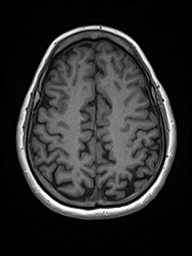
[im 122/157]
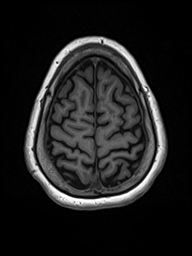
[im 139/157]
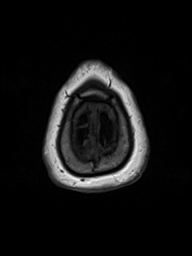
[im 157/157]
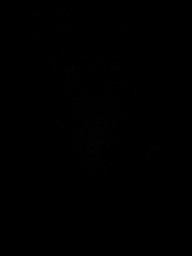

[Series 16: T2 · coronal · 4.5mm · 0.36mm/px · 2 of 30 slices shown (2 of 2)]
[im 1/30]
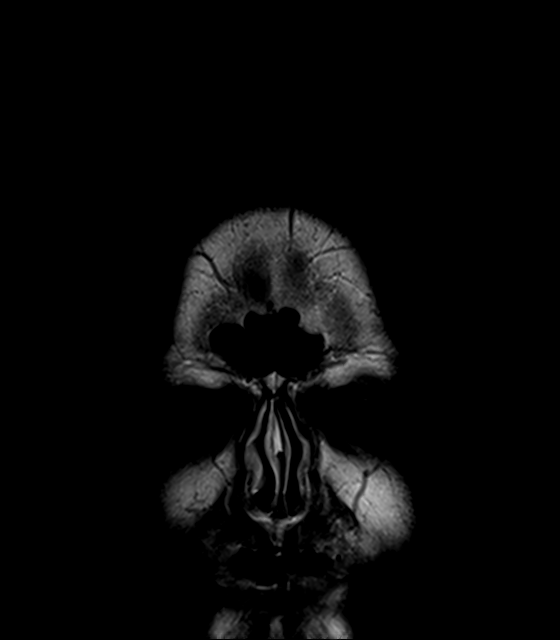
[im 30/30]
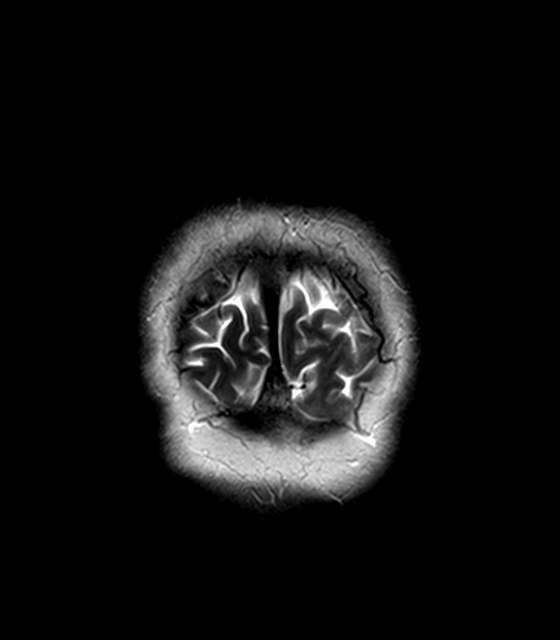

[48 of 48 positions shown; findings below may reference images not displayed]

FINDINGS: Brain:

Cerebral volume appears normal for age.

Mild multifocal T2 FLAIR hyperintense signal abnormality within the
cerebral white matter, nonspecific but most often secondary to
chronic small vessel ischemia.

There is no acute infarct.

No evidence of an intracranial mass.

No chronic intracranial blood products.

No extra-axial fluid collection.

No midline shift.

Vascular: Maintained flow voids within the proximal large arterial
vessels.

Skull and upper cervical spine: No focal suspicious marrow lesion.

Sinuses/Orbits: Visualized orbits show no acute finding. Mild
mucosal thickening within the bilateral ethmoid sinuses. Small
mucous retention cyst within the right sphenoid sinus. Small mucous
retention cyst within the left maxillary sinus.
IMPRESSION: No evidence of acute intracranial abnormality.

Mild multifocal T2 FLAIR hyperintense signal abnormality within the
cerebral white matter, nonspecific but most often secondary to
chronic small vessel ischemia. These signal changes have slightly
progressed from the prior brain MRI of 02/23/2016.

Otherwise unremarkable non-contrast MRI appearance of the brain for
age.

Paranasal sinus disease, as described.

## 2023-08-19 ENCOUNTER — Encounter: Payer: Self-pay | Admitting: Neurology

## 2023-08-19 ENCOUNTER — Other Ambulatory Visit

## 2023-08-19 ENCOUNTER — Ambulatory Visit: Admitting: Neurology

## 2023-08-19 VITALS — BP 131/79 | HR 83 | Ht 65.0 in | Wt 167.0 lb

## 2023-08-19 DIAGNOSIS — E559 Vitamin D deficiency, unspecified: Secondary | ICD-10-CM

## 2023-08-19 DIAGNOSIS — R42 Dizziness and giddiness: Secondary | ICD-10-CM

## 2023-08-19 DIAGNOSIS — M542 Cervicalgia: Secondary | ICD-10-CM

## 2023-08-19 DIAGNOSIS — G4452 New daily persistent headache (NDPH): Secondary | ICD-10-CM | POA: Diagnosis not present

## 2023-08-19 MED ORDER — NORTRIPTYLINE HCL 10 MG PO CAPS
10.0000 mg | ORAL_CAPSULE | Freq: Every day | ORAL | 3 refills | Status: DC
Start: 2023-08-19 — End: 2024-01-12

## 2023-08-19 NOTE — Patient Instructions (Signed)
 I saw you today for head pressure and disequilibrium. I'm not sure the cause of your symptoms. The neck can often be implicated in head pains and disequilibrium, so I encourage you to continue to work with therapy with the dry needling as this may help.  This could also be due to the head pressure. I will trial a medication called nortriptyline  to see if this helps. You will take 10 mg at bedtime. It can cause sleepiness and dry mouth most commonly.  Your vitamin D  was low, so I recommend vitamin D  1000 international units daily.  I would like to check other labs today to make sure there are no other contributing causes.  I will also repeat your MRI brain to be sure there is nothing that has changed there that could be contributing to symptoms.  I will be in touch when I have your results.  I will see you back in clinic in about 3 months. Please let me know if you have any questions or concerns in the meantime.  The physicians and staff at [facility] are committed to providing excellent care. You may receive a survey requesting feedback about your experience at our office. We strive to receive "very good" responses to the survey questions. If you feel that your experience would prevent you from giving the office a "very good " response, please contact our office to try to remedy the situation. We may be reached at [phone]. Thank you for taking the time out of your busy day to complete the survey.  Rommie Coats, MD [facility]

## 2023-08-19 NOTE — Addendum Note (Signed)
 Addended by: Arturo Bing on: 08/19/2023 03:04 PM   Modules accepted: Orders

## 2023-08-23 LAB — VITAMIN B12: Vitamin B-12: 688 pg/mL (ref 200–1100)

## 2023-08-23 LAB — VITAMIN B1: Vitamin B1 (Thiamine): 8 nmol/L (ref 8–30)

## 2023-08-24 ENCOUNTER — Ambulatory Visit: Payer: Self-pay | Admitting: Neurology

## 2023-09-11 ENCOUNTER — Ambulatory Visit
Admission: RE | Admit: 2023-09-11 | Discharge: 2023-09-11 | Disposition: A | Discharge status: Home / Self Care | Source: Ambulatory Visit | Attending: Neurology | Admitting: Neurology

## 2023-09-11 DIAGNOSIS — J341 Cyst and mucocele of nose and nasal sinus: Secondary | ICD-10-CM | POA: Diagnosis not present

## 2023-09-11 DIAGNOSIS — R42 Dizziness and giddiness: Secondary | ICD-10-CM

## 2023-09-11 DIAGNOSIS — G4452 New daily persistent headache (NDPH): Secondary | ICD-10-CM

## 2023-09-11 DIAGNOSIS — R9082 White matter disease, unspecified: Secondary | ICD-10-CM | POA: Diagnosis not present

## 2023-09-11 DIAGNOSIS — M542 Cervicalgia: Secondary | ICD-10-CM

## 2023-09-11 MED ORDER — GADOPICLENOL 0.5 MMOL/ML IV SOLN
7.5000 mL | Freq: Once | INTRAVENOUS | Status: AC | PRN
  Administered 2023-09-11: 7.5 mL via INTRAVENOUS

## 2023-09-13 DIAGNOSIS — M79641 Pain in right hand: Secondary | ICD-10-CM | POA: Diagnosis not present

## 2023-09-13 DIAGNOSIS — E663 Overweight: Secondary | ICD-10-CM | POA: Diagnosis not present

## 2023-09-13 DIAGNOSIS — M1991 Primary osteoarthritis, unspecified site: Secondary | ICD-10-CM | POA: Diagnosis not present

## 2023-09-13 DIAGNOSIS — M109 Gout, unspecified: Secondary | ICD-10-CM | POA: Diagnosis not present

## 2023-09-13 DIAGNOSIS — Z6828 Body mass index (BMI) 28.0-28.9, adult: Secondary | ICD-10-CM | POA: Diagnosis not present

## 2023-09-13 DIAGNOSIS — M79642 Pain in left hand: Secondary | ICD-10-CM | POA: Diagnosis not present

## 2023-10-06 DIAGNOSIS — R7989 Other specified abnormal findings of blood chemistry: Secondary | ICD-10-CM | POA: Diagnosis not present

## 2023-10-06 DIAGNOSIS — E291 Testicular hypofunction: Secondary | ICD-10-CM | POA: Diagnosis not present

## 2023-10-12 DIAGNOSIS — H01001 Unspecified blepharitis right upper eyelid: Secondary | ICD-10-CM | POA: Diagnosis not present

## 2023-10-12 DIAGNOSIS — B880 Other acariasis: Secondary | ICD-10-CM | POA: Diagnosis not present

## 2023-10-12 DIAGNOSIS — H01004 Unspecified blepharitis left upper eyelid: Secondary | ICD-10-CM | POA: Diagnosis not present

## 2023-10-26 DIAGNOSIS — M791 Myalgia, unspecified site: Secondary | ICD-10-CM | POA: Diagnosis not present

## 2023-10-26 DIAGNOSIS — M542 Cervicalgia: Secondary | ICD-10-CM | POA: Diagnosis not present

## 2023-11-02 DIAGNOSIS — E291 Testicular hypofunction: Secondary | ICD-10-CM | POA: Diagnosis not present

## 2023-11-02 DIAGNOSIS — R7989 Other specified abnormal findings of blood chemistry: Secondary | ICD-10-CM | POA: Diagnosis not present

## 2023-11-19 ENCOUNTER — Ambulatory Visit: Admitting: Neurology

## 2023-11-30 DIAGNOSIS — E291 Testicular hypofunction: Secondary | ICD-10-CM | POA: Diagnosis not present

## 2023-12-09 DIAGNOSIS — I10 Essential (primary) hypertension: Secondary | ICD-10-CM | POA: Diagnosis not present

## 2023-12-09 DIAGNOSIS — E291 Testicular hypofunction: Secondary | ICD-10-CM | POA: Diagnosis not present

## 2023-12-09 DIAGNOSIS — E559 Vitamin D deficiency, unspecified: Secondary | ICD-10-CM | POA: Diagnosis not present

## 2023-12-09 DIAGNOSIS — M818 Other osteoporosis without current pathological fracture: Secondary | ICD-10-CM | POA: Diagnosis not present

## 2023-12-13 ENCOUNTER — Encounter: Payer: Self-pay | Admitting: Family Medicine

## 2023-12-13 ENCOUNTER — Ambulatory Visit (INDEPENDENT_AMBULATORY_CARE_PROVIDER_SITE_OTHER): Admitting: Family Medicine

## 2023-12-13 ENCOUNTER — Ambulatory Visit: Payer: Self-pay | Admitting: Family Medicine

## 2023-12-13 VITALS — BP 130/80 | HR 75 | Temp 98.4°F | Ht 64.0 in | Wt 164.2 lb

## 2023-12-13 DIAGNOSIS — I10 Essential (primary) hypertension: Secondary | ICD-10-CM

## 2023-12-13 DIAGNOSIS — M816 Localized osteoporosis [Lequesne]: Secondary | ICD-10-CM

## 2023-12-13 DIAGNOSIS — Z125 Encounter for screening for malignant neoplasm of prostate: Secondary | ICD-10-CM | POA: Diagnosis not present

## 2023-12-13 DIAGNOSIS — Z0000 Encounter for general adult medical examination without abnormal findings: Secondary | ICD-10-CM | POA: Diagnosis not present

## 2023-12-13 DIAGNOSIS — E663 Overweight: Secondary | ICD-10-CM | POA: Diagnosis not present

## 2023-12-13 DIAGNOSIS — I890 Lymphedema, not elsewhere classified: Secondary | ICD-10-CM

## 2023-12-13 DIAGNOSIS — E782 Mixed hyperlipidemia: Secondary | ICD-10-CM

## 2023-12-13 DIAGNOSIS — Z131 Encounter for screening for diabetes mellitus: Secondary | ICD-10-CM | POA: Diagnosis not present

## 2023-12-13 LAB — LIPID PANEL
Cholesterol: 177 mg/dL (ref 0–200)
HDL: 68 mg/dL (ref >39.00)
LDL Cholesterol: 96 mg/dL (ref 0–99)
NonHDL: 109.39
Total CHOL/HDL Ratio: 3
Triglycerides: 68 mg/dL (ref 0.0–149.0)
VLDL: 13.6 mg/dL (ref 0.0–40.0)

## 2023-12-13 LAB — HEMOGLOBIN A1C: Hgb A1c MFr Bld: 5.9 % (ref 4.6–6.5)

## 2023-12-13 MED ORDER — ZOLPIDEM TARTRATE 10 MG PO TABS: 10.0000 mg | ORAL_TABLET | Freq: Every evening | ORAL | 2 refills | Status: AC | PRN

## 2023-12-13 NOTE — Patient Instructions (Addendum)
 Please stop by lab before you go If you have mychart- we will send your results within 3 business days of us  receiving them.  If you do not have mychart- we will call you about results within 5 business days of us  receiving them.  *please also note that you will see labs on mychart as soon as they post. I will later go in and write notes on them- will say notes from Dr. Katrinka Quant, Diphtheria, and Pertussis (Tdap) at pharmacy recommended   Recommended follow up: Return in about 6 months (around 06/11/2024) for followup or sooner if needed.Schedule b4 you leave.

## 2023-12-13 NOTE — Progress Notes (Signed)
 Phone: [phone]   Subjective:  Patient presents today for their annual physical. Chief complaint-noted.   See problem oriented charting- ROS- full  review of systems was completed and negative  except for topics noted under acute/chronic concerns  The following were reviewed and entered/updated in epic: Past Medical History:  Diagnosis Date   Adenomatous colon polyp    Allergy    Asthma    in past   Cellulitis 2001    LLE ; [facility]   Cellulitis 2007   R elbow ; S/ P drainage by Dr Lucious   Cellulitis 2014   with sepsis   Cellulitis 08/09/2014   LLE   Diverticulosis    Eczema    Edema    Gastritis with intestinal metaplasia of stomach 08/07/2019   GERD (gastroesophageal reflux disease)    Hiatal hernia    Internal hemorrhoids    Recurrent cellulitis of lower leg 08/15/2014   Septic thrombophlebitis 08/15/2014   Testicular cancer [facility]) 2004   Dr Watt   Patient Active Problem List   Diagnosis Date Noted   Hypogonadism male 11/09/2018    Priority: High   Osteoporosis 06/29/2017    Priority: High   Recurrent cellulitis of lower leg 08/15/2014    Priority: High   Atopic eczema 03/21/2007    Priority: High   Essential hypertension 03/07/2021    Priority: Medium    Insomnia 06/06/2020    Priority: Medium    Essential tremor 06/06/2020    Priority: Medium    Hiatal hernia 06/06/2020    Priority: Medium    Gout 11/24/2018    Priority: Medium    Allergic reaction 11/09/2018    Priority: Medium    Chronic cough 09/16/2018    Priority: Medium    Lymphedema     Priority: Medium    Other abnormal glucose 08/13/2013    Priority: Medium    Asthma 03/28/2009    Priority: Medium    History of testicular mass- Seminoma of descended right testis 03/21/2007    Priority: Medium    HYPERLIPIDEMIA 03/21/2007    Priority: Medium    Metatarsal stress fracture of right foot 08/05/2017    Priority: Low   Erectile dysfunction 12/09/2015    Priority: Low    GERD (gastroesophageal reflux disease) 12/09/2015    Priority: Low   Hair loss 08/23/2014    Priority: Low   Septic thrombophlebitis 08/15/2014    Priority: Low   History of adenomatous polyp of colon 03/28/2009    Priority: Low   Habitual alcohol use 03/07/2021   Eczema 06/29/2012   Past Surgical History:  Procedure Laterality Date   COLONOSCOPY W/ POLYPECTOMY  05/14/2008 & 04/2012   Tubular adenoma; Dr. Alm Gander   FOOT FRACTURE SURGERY Left 05/2012   FRACTURE SURGERY     I & D EXTREMITY Left 2004   elbow   KNEE ARTHROPLASTY Right     Dr Jane,  for patellar instability   KNEE ARTHROSCOPY Right 1981    Dr Jane   REFRACTIVE SURGERY Bilateral 2004   SHOULDER SURGERY  07/2020   Right shoulder   testicle removed Right    pt. not 100 percent sure   TUMOR EXCISION  2005    Seminoma; Dr Watt   VIDEO BRONCHOSCOPY N/A 11/03/2018   Procedure: VIDEO BRONCHOSCOPY WITHOUT FLUORO;  Surgeon: Shelah Lamar RAMAN, MD;  Location: [facility] ENDOSCOPY;  Service: Cardiopulmonary;  Laterality: N/A;    Family History  Problem Relation Age of Onset  Heart disease Father        dysrrhymthmia   Colon cancer Father    Liver cancer Father    Transient ischemic attack Mother        in 17s   Asthma Sister    Cancer Paternal Grandfather        stomach   Eczema Maternal Uncle    Diabetes Neg Hx    Colon polyps Neg Hx    Esophageal cancer Neg Hx    Rectal cancer Neg Hx    Stomach cancer Neg Hx     Medications- reviewed and updated Current Outpatient Medications  Medication Sig Dispense Refill   allopurinol  (ZYLOPRIM ) 300 MG tablet Take 300 mg by mouth daily. Sees rheumatology     augmented betamethasone  dipropionate (DIPROLENE -AF) 0.05 % ointment APPLY TO THE AFFECTED AREA OF ECZEMA ON THE BODY AT BEDTIME. 50 g 1   desonide  (DESOWEN ) 0.05 % ointment APPLY TOPICALLY TO FACE TWICE DAILY AS NEEDED 60 g 2   dexlansoprazole  (DEXILANT ) 60 MG capsule Take 1 capsule (60 mg total) by mouth daily. 90  capsule 3   DUPIXENT 300 MG/2ML SOSY      ibuprofen (ADVIL) 600 MG tablet      nortriptyline  (PAMELOR ) 10 MG capsule Take 1 capsule (10 mg total) by mouth at bedtime. 30 capsule 3   rosuvastatin  (CRESTOR ) 20 MG tablet TAKE 1 TABLET(20 MG) BY MOUTH DAILY 90 tablet 2   testosterone  cypionate (DEPOTESTOSTERONE CYPIONATE) 200 MG/ML injection      valsartan  (DIOVAN ) 40 MG tablet Take 1 tablet (40 mg total) by mouth daily. 90 tablet 3   famotidine  (PEPCID ) 40 MG tablet 1 tablet at bedtime Orally Once a day (Patient not taking: Reported on 12/13/2023)     predniSONE (DELTASONE) 20 MG tablet Take 20 mg by mouth daily as needed. (Patient not taking: Reported on 12/13/2023)     zolpidem  (AMBIEN ) 10 MG tablet Take 1 tablet (10 mg total) by mouth at bedtime as needed for sleep. 15 tablet 2   No current facility-administered medications for this visit.    Allergies-reviewed and updated No Known Allergies  Social History   Social History Narrative   Married. 2 living children (lost 3rd child who was a twin due to heart defect at 53 months)      Retired January 2022   Are you right handed or left handed? Left   Are you currently employed ?    What is your current occupation? retired   Do you live at home alone?   Who lives with you? wife   What type of home do you live in: 1 story or 2 story? three    Caffeine 1-3 a day   Objective  Objective:  BP 130/80   Pulse 75   Temp 98.4 F (36.9 C)   Ht 5' 4 (1.626 m)   Wt 164 lb 3.2 oz (74.5 kg)   SpO2 98%   BMI 28.18 kg/m  Gen: NAD, resting comfortably HEENT: Mucous membranes are moist. Oropharynx normal Neck: no thyromegaly CV: RRR no murmurs rubs or gallops Lungs: CTAB no crackles, wheeze, rhonchi Abdomen: soft/nontender/nondistended/normal bowel sounds. No rebound or guarding.  Ext: no edema Skin: warm, dry Neuro: grossly normal, moves all extremities, PERRLA   Assessment and Plan  68 y.o. male presenting for annual physical.  Health  Maintenance counseling: 1. Anticipatory guidance: Patient counseled regarding regular dental exams -q6 months, eye exams -yearly,  avoiding smoking and second hand smoke , limiting  alcohol to 2 beverages per day - 8 per week- has worked on cutting down, no illicit drugs .   2. Risk factor reduction:  Advised patient of need for regular exercise and diet rich and fruits and vegetables to reduce risk of heart attack and stroke.  Exercise- walking 4 days a week 2-3 miles plus some golf. Active with traveling- a lot of walking Diet/weight management-down 2 lbs from our last visit -he's trying to eat healthy diet with more vegetables . May add weights.  Wt Readings from Last 3 Encounters:  12/13/23 164 lb 3.2 oz (74.5 kg)  08/19/23 167 lb (75.8 kg)  06/03/23 166 lb 6.4 oz (75.5 kg)   3. Immunizations/screenings/ancillary studies- opts out flu shot. Tetanus, Diphtheria, and Pertussis (Tdap) at pharmacy recommended. Shingrix and Prevnar declines Immunization History  Administered Date(s) Administered   PFIZER(Purple Top)SARS-COV-2 Vaccination 06/09/2019, 06/26/2019, 07/10/2019, 01/30/2020, 03/09/2020   4. Prostate cancer screening-  most recently 12/09/23 was at 0.96 Lab Results  Component Value Date   PSA 1.04 04/22/2021   PSA 0.68 03/03/2016   PSA 0.53 09/03/2014   5. Colon cancer screening - 03/03/23 with 5 year repeat with Dr. Albertus 6. Skin cancer screening- Dr. Bard Molt yearly. advised regular sunscreen use. Denies worrisome, changing, or new skin lesions.  7. Smoking associated screening (lung cancer screening, AAA screen 65-75, UA)- never smoker 8. STD screening - only active with wife  Status of chronic or acute concerns   #lymphedema- needs new prescription for compression  # Headaches/dysequilibrium -Refer to neurology in 2025 Dr. Leigh started on nortriptyline  10 mg at night- but not helping so he stopped -head is better but still unbalanced  #hypertension S:  medication:valsartan  40 mg -prior metoprolol - also helped tremor A/P: well controlled continue current medications    #hyperlipidemia with CT calcium  score March 2023 of 418 83rd percentile  S: Medication:Rosuvastatin  20 mg daily -Exercise tolerance test negative for ischemia after CT calcium  scoring  Lab Results  Component Value Date   CHOL 125 04/07/2023   HDL 61 04/07/2023   LDLCALC 45 04/07/2023   LDLDIRECT 148.1 07/22/2012   TRIG 107 04/07/2023   CHOLHDL 2.3 12/12/2021  A/P: #s very reassuring with LDL under 70 continue current medications   # GERD- S:Medication: Dexilant  60 mg. Not needing famotidine  -still with some cough particularly after eating- working with Dr. Elwin A/P: ongoing issues- they are considering a procedure     # Low testosterone -follows with Dr. Elsie Sharps with [facility] # Osteoporosis S: Medication: Testosterone  injection 0.5 mL every 2 weeks -prior Fosamax 70 mg weekly but stopped in 2025 -Patient with associated osteoporosis managed by endocrinology with worst T-score in 2019 and -2.9 at the spine.  Started on alendronate after July 2020 DEXA showed worsening- now off A/P: reports good control on recent check- continue to monitor     #Gout-follows with rheumatology S: Medication: Allopurinol  300 mg As needed: prednisone with flares- prior Mitigare  A/P:no recent flare- continue to monitor     # History of testicular cancer-previously seen by Dr. Timmy but released since before 2020   # Eczema-on Dupixent through Dr. Molt with dermatology .   #asthma- albuterol  as needed   #Insomnia- Ambien  10 mg daily once a week -avoid alcohol on nights he takes  #Neck doing better in 2025- Dr. Ibazebo with injection and doing better   Recommended follow up: Return in about 6 months (around 06/11/2024) for followup or sooner if needed.Schedule b4 you leave. Future  Appointments  Date Time Provider Department Center  01/12/2024  9:00 AM  Lonni Slain, MD [id number]  01/27/2024 10:30 AM Leigh Venetia CROME, MD [id number] None   Lab/Order associations:NOT fasting- 9 am coffee with milk   ICD-10-CM   1. Preventative health care  Z00.00     2. Essential hypertension  I10     3. Mixed hyperlipidemia  E78.2 Lipid panel    4. Screening for diabetes mellitus  Z13.1 Hemoglobin A1c    5. Overweight  E66.3 Hemoglobin A1c    6. Screening for prostate cancer  Z12.5     7. Localized osteoporosis without current pathological fracture  M81.6     8. Lymphedema  I89.0 For home use only DME Other see comment      Meds ordered this encounter  Medications   zolpidem  (AMBIEN ) 10 MG tablet    Sig: Take 1 tablet (10 mg total) by mouth at bedtime as needed for sleep.    Dispense:  15 tablet    Refill:  2    Return precautions advised.  Garnette Lukes, MD

## 2023-12-28 DIAGNOSIS — E291 Testicular hypofunction: Secondary | ICD-10-CM | POA: Diagnosis not present

## 2024-01-10 ENCOUNTER — Encounter (HOSPITAL_BASED_OUTPATIENT_CLINIC_OR_DEPARTMENT_OTHER): Payer: Self-pay

## 2024-01-11 NOTE — Progress Notes (Signed)
 Cardiology Office Note:  .   Date:  01/12/2024  ID:  Gregory JULIANNA Natha Mickey., DOB Mar 29, 1955, MRN [id number] PCP: Katrinka Garnette KIDD, MD   HeartCare Providers Cardiologist:  Shelda Bruckner, MD {  History of Present Illness: .   Gregory Phillips. is a 68 y.o. male  with a hx of mixed hyperlipidemia  who is seen for follow up today. I initially met him 06/25/21 as a new consult at the request of Katrinka Garnette KIDD, MD for the evaluation and management of mixed hyperlipidemia and elevated coronary calcium  score.   Cardiovascular risk factors: Hypertension - Since the past 1-2 years. Recently he was started on metoprolol , since then his tremors have improved. Hyperlipidemia -  On rosuvastatin  for about 1 month. -Family history: No known history of heart disease. His mother had a stroke at 57 yo. -CT Calcium  Score 05/2021 which showed coronary calcium  score of 418. This was 3 percentile for age-, race-, and sex-matched controls.  Today: Enjoying retirement. Has been traveling, staying active. Hikes in parks regularly, no limitations. No recent gout flares.   BP low today, asymptomatic. Runs 120s/70-80s at home, reviewed.   Headaches are better.   Lipids reviewed. Takes rosuvastatin  daily. Reviewed options, see below.  ROS: Denies chest pain, shortness of breath at rest or with normal exertion. No PND, orthopnea, LE edema or unexpected weight gain. No syncope or palpitations. ROS otherwise negative except as noted.   Studies Reviewed: SABRA    EKG:  EKG Interpretation Date/Time:  Wednesday January 12 2024 09:10:28 EDT Ventricular Rate:  68 PR Interval:  132 QRS Duration:  82 QT Interval:  410 QTC Calculation: 435 R Axis:   24  Text Interpretation: Normal sinus rhythm Normal ECG When compared with ECG of 15-Dec-2022 16:43, No significant change was found Confirmed by Bruckner Shelda [phone]) on 01/12/2024 9:34:33 AM    Physical Exam:   VS:  BP 104/68   Pulse (!) 59    Ht 5' 4 (1.626 m)   Wt 172 lb 6.4 oz (78.2 kg)   SpO2 98%   BMI 29.59 kg/m    Wt Readings from Last 3 Encounters:  01/12/24 172 lb 6.4 oz (78.2 kg)  12/13/23 164 lb 3.2 oz (74.5 kg)  08/19/23 167 lb (75.8 kg)    GEN: Well nourished, well developed in no acute distress HEENT: Normal, moist mucous membranes NECK: No JVD CARDIAC: regular rhythm, normal S1 and S2, no rubs or gallops. No murmur. VASCULAR: Radial and DP pulses 2+ bilaterally. No carotid bruits RESPIRATORY:  Clear to auscultation without rales, wheezing or rhonchi  ABDOMEN: Soft, non-tender, non-distended MUSCULOSKELETAL:  Ambulates independently SKIN: Warm and dry, no edema NEUROLOGIC:  Alert and oriented x 3. No focal neuro deficits noted. PSYCHIATRIC:  Normal affect    ASSESSMENT AND PLAN: .    Coronary calcification, consistent with nonobstructive CAD Hypercholesterolemia -calcium  score 418. ETT negative for ischemia -previously on aspirin , stopped  -tolerating rosuvastatin , Last LDL 96, goal <70. Discussed lifestyle, exercises a lot but feels diet could be better.  -discussed increasing rosuvastatin  dose, adding ezetimibe. Would not use nexletol given gout. Also discussed PCSK9i, he is familiar with self injections -after shared decision making, will increase rosuvastatin  to 40 mg daily and work on diet, recheck lipid in 3 mos. Based on results, if not at goal, would add ezetimibe (if close to goal) or PCSK9i (if not close to goal)   Hypertension -history of gout flares and elevated uric acid -avoid  thiazides -continue valsartan   CV risk counseling and prevention -recommend heart healthy/Mediterranean diet, with whole grains, fruits, vegetable, fish, lean meats, nuts, and olive oil. Limit salt. -recommend moderate walking, 3-5 times/week for 30-50 minutes each session. Aim for at least 150 minutes.week. Goal should be pace of 3 miles/hours, or walking 1.5 miles in 30 minutes -recommend avoidance of tobacco  products. Avoid excess alcohol.    Dispo: 1 year or sooner as needed  Signed, Shelda Bruckner, MD   Shelda Bruckner, MD, PhD, [facility]  [facility]  [place]  [facility] at [facility] at [facility] [address], [address] [place], [place] [address] [phone]

## 2024-01-12 ENCOUNTER — Ambulatory Visit (HOSPITAL_BASED_OUTPATIENT_CLINIC_OR_DEPARTMENT_OTHER): Admitting: Cardiology

## 2024-01-12 ENCOUNTER — Encounter (HOSPITAL_BASED_OUTPATIENT_CLINIC_OR_DEPARTMENT_OTHER): Payer: Self-pay | Admitting: Cardiology

## 2024-01-12 VITALS — BP 104/68 | HR 59 | Ht 64.0 in | Wt 172.4 lb

## 2024-01-12 DIAGNOSIS — I2510 Atherosclerotic heart disease of native coronary artery without angina pectoris: Secondary | ICD-10-CM | POA: Diagnosis not present

## 2024-01-12 DIAGNOSIS — I10 Essential (primary) hypertension: Secondary | ICD-10-CM | POA: Diagnosis not present

## 2024-01-12 DIAGNOSIS — L2089 Other atopic dermatitis: Secondary | ICD-10-CM | POA: Diagnosis not present

## 2024-01-12 DIAGNOSIS — Z7189 Other specified counseling: Secondary | ICD-10-CM

## 2024-01-12 DIAGNOSIS — E7800 Pure hypercholesterolemia, unspecified: Secondary | ICD-10-CM | POA: Diagnosis not present

## 2024-01-12 DIAGNOSIS — L820 Inflamed seborrheic keratosis: Secondary | ICD-10-CM | POA: Diagnosis not present

## 2024-01-12 MED ORDER — ROSUVASTATIN CALCIUM 40 MG PO TABS: 40.0000 mg | ORAL_TABLET | Freq: Every day | ORAL | 3 refills | Status: AC

## 2024-01-12 NOTE — Patient Instructions (Signed)
 Medication Instructions:  Your physician has recommended you make the following change in your medication:  1.) increase rosuvastatin  to 40 mg - one tablet by mouth daily  *If you need a refill on your cardiac medications before your next appointment, please call your pharmacy*  Lab Work: Return for blood work (fasting) in 3 months  Follow-Up: At [facility], you and your health needs are our priority.  As part of our continuing mission to provide you with exceptional heart care, our providers are all part of one team.  This team includes your primary Cardiologist (physician) and Advanced Practice Providers or APPs (Physician Assistants and Nurse Practitioners) who all work together to provide you with the care you need, when you need it.  Your next appointment:   1 year(s)  Provider:   Shelda Bruckner, MD, Rosaline Bane, NP, or Reche Finder, NP

## 2024-01-19 NOTE — Progress Notes (Signed)
 NEUROLOGY FOLLOW UP OFFICE NOTE  Marciano Mundt [id number]  Subjective:  Gregory Phillips. is a 68 y.o. year old left-handed male with a medical history of HTN, HLD, gout, OA, testicular cancer who we last saw on 08/19/23 for headaches.  To briefly review: 08/19/23: Patient has had symptoms for about 5 years. He notices symptoms more when he is walking over a bridge. He will feel like he is swaying but he is not. Almost like he got off a boat that was rocking. He did not previously have fear of heights, but has noticed this more. He has difficulties with escalators, boats, or roller coasters. Turning his head does not make him dizzy, but he will occasionally notice his vision will have to catch up when turning his head quickly.   He also has noticed the pressure in his head. This has been present longer than the disequilibrium. It is in the forehead and top of the head and occasionally in the back of his head. He feels like he has a suction cup on his head pulling up. When he is in the shower washing his hair and has eyes closed, he will have to be careful to not lose his balance. This has been constant for at least 1.5 to 2 years. He rates the discomfort from 3-5/10. He denies any photophobia, phonophobia, or nausea or vomiting.   Initially symptoms were thought to potentially be BP related, but with better control, symptoms have not improved.   He denies any numbness, tingling, or weakness in arms or legs. He did have right shoulder surgery 2 years ago and still struggles with pain in the shoulder. He denies significant neck pain or stiffness. He does weekly PT with dry needling for the right shoulder currently (neck and back). This has helped the pain.   He denies any constitutional symptoms including fevers, night sweats, or unintended weight loss.   Smoker: no Caffiene use: 2 cups of coffee per day EtOH use: 2 drinks per day Restrictive diet: no Family history of neurologic  disease: no   He has never been on medications for headaches or dizziness.  Most recent Assessment and Plan (08/19/23): Elek Holderness. is a 68 y.o. male who presents for evaluation of head pressure and disequilibrium. He has a relevant medical history of HTN, HLD, gout, OA, testicular cancer. His neurological examination is essentially normal today. He does have tightness of his neck, particularly on the right. Available diagnostic data is significant for MRI brain that showed chronic microvascular ischemia in 2023 that had progressed from 2017. The etiology of patient's symptoms is unclear. Neck pain can cause dizziness like sensation and headaches, so PT and dry needling patient is already doing could help. This could be new daily persistent headache as well, so I will trial nortriptyline  to see if this helps with symptoms. His examination is reassuring but given the changes seen on last MRI brain, a repeat to ensure stability is reasonable.   PLAN: -Blood work: B1, B12 -Repeat MRI brain w/wo contrast -Continue PT and dry needling of neck and shoulder -Nortriptyline  10 mg at bedtime -Vitamin D  1000 international units daily  Since their last visit: Labs were normal. MRI brain was similar to prior (2023) without explanation of headaches.  Patient states his headaches had improved until just recently. The headaches came back in the last month. He is now having a headache pretty much daily, mostly in the morning. He has some associated imbalance.  He is a pressure on the top of his head. It is about 5/10. It lasts a couple of hours. He denies photophobia, phonophobia, or nausea/vomiting. He will take motrin and tylenol  daily. He does not notice a lot of difference with the medication.  Patient took nortriptyline  10 mg for about a month and did not notice a difference so stopped the medication. Headaches improved, but per patient were unrelated to nortriptyline . He did not let me know he  stopped the medication until today.   Patient has completed PT and his neck is feeling good.  MEDICATIONS:  Outpatient Encounter Medications as of 01/27/2024  Medication Sig   allopurinol  (ZYLOPRIM ) 300 MG tablet Take 300 mg by mouth daily. Sees rheumatology   augmented betamethasone  dipropionate (DIPROLENE -AF) 0.05 % ointment APPLY TO THE AFFECTED AREA OF ECZEMA ON THE BODY AT BEDTIME.   desonide  (DESOWEN ) 0.05 % ointment APPLY TOPICALLY TO FACE TWICE DAILY AS NEEDED   dexlansoprazole  (DEXILANT ) 60 MG capsule Take 1 capsule (60 mg total) by mouth daily.   DUPIXENT 300 MG/2ML SOSY    ibuprofen (ADVIL) 600 MG tablet    nortriptyline  (PAMELOR ) 10 MG capsule Take 2 capsules (20 mg total) by mouth at bedtime. Take 1 capsule (10 mg) at bedtime for 1 week, then increase to 2 capsules (20 mg) at bedtime thereafter.   rosuvastatin  (CRESTOR ) 40 MG tablet Take 1 tablet (40 mg total) by mouth daily.   testosterone  cypionate (DEPOTESTOSTERONE CYPIONATE) 200 MG/ML injection    valsartan  (DIOVAN ) 40 MG tablet Take 1 tablet (40 mg total) by mouth daily.   zolpidem  (AMBIEN ) 10 MG tablet Take 1 tablet (10 mg total) by mouth at bedtime as needed for sleep.   No facility-administered encounter medications on file as of 01/27/2024.    PAST MEDICAL HISTORY: Past Medical History:  Diagnosis Date   Adenomatous colon polyp    Allergy    Asthma    in past   Cellulitis 2001    LLE ; [facility]   Cellulitis 2007   R elbow ; S/ P drainage by Dr Lucious   Cellulitis 2014   with sepsis   Cellulitis 08/09/2014   LLE   Diverticulosis    Eczema    Edema    Gastritis with intestinal metaplasia of stomach 08/07/2019   GERD (gastroesophageal reflux disease)    Hiatal hernia    Internal hemorrhoids    Recurrent cellulitis of lower leg 08/15/2014   Septic thrombophlebitis 08/15/2014   Testicular cancer [facility]) 2004   Dr Watt    PAST SURGICAL HISTORY: Past Surgical History:  Procedure Laterality  Date   COLONOSCOPY W/ POLYPECTOMY  05/14/2008 & 04/2012   Tubular adenoma; Dr. Alm Gander   FOOT FRACTURE SURGERY Left 05/2012   FRACTURE SURGERY     I & D EXTREMITY Left 2004   elbow   KNEE ARTHROPLASTY Right     Dr Jane,  for patellar instability   KNEE ARTHROSCOPY Right 1981    Dr Jane   REFRACTIVE SURGERY Bilateral 2004   SHOULDER SURGERY  07/2020   Right shoulder   testicle removed Right    pt. not 100 percent sure   TUMOR EXCISION  2005    Seminoma; Dr Watt   VIDEO BRONCHOSCOPY N/A 11/03/2018   Procedure: VIDEO BRONCHOSCOPY WITHOUT FLUORO;  Surgeon: Shelah Lamar RAMAN, MD;  Location: [facility] ENDOSCOPY;  Service: Cardiopulmonary;  Laterality: N/A;    ALLERGIES: No Known Allergies  FAMILY HISTORY: Family History  Problem  Relation Age of Onset   Heart disease Father        dysrrhymthmia   Colon cancer Father    Liver cancer Father    Transient ischemic attack Mother        in 42s   Asthma Sister    Cancer Paternal Grandfather        stomach   Eczema Maternal Uncle    Diabetes Neg Hx    Colon polyps Neg Hx    Esophageal cancer Neg Hx    Rectal cancer Neg Hx    Stomach cancer Neg Hx     SOCIAL HISTORY: Social History   Tobacco Use   Smoking status: Never   Smokeless tobacco: Never  Vaping Use   Vaping status: Never Used  Substance Use Topics   Alcohol use: Yes    Alcohol/week: 14.0 standard drinks of alcohol    Types: 14 Cans of beer per week    Comment: week   Drug use: No   Social History   Social History Narrative   Married. 2 living children (lost 3rd child who was a twin due to heart defect at 22 months)      Retired January 2022   Are you right handed or left handed? Left   Are you currently employed ?    What is your current occupation? retired   Do you live at home alone?   Who lives with you? wife   What type of home do you live in: 1 story or 2 story? three    Caffeine 1-3 a day      Objective:  Vital Signs:  BP 131/86   Pulse  70   Ht 5' 4 (1.626 m)   Wt 168 lb (76.2 kg)   SpO2 96%   BMI 28.84 kg/m   General: No acute distress.  Patient appears well-groomed.   Head:  Normocephalic/atraumatic Eyes:  fundi examined but not visualized Neck: supple, positive for paraspinal tenderness, reduced range of motion Heart: regular rate and rhythm Lungs: Clear to auscultation bilaterally. Vascular: No carotid bruits.  Neurological Exam: Mental status: alert and oriented, speech fluent and not dysarthric, language intact.  Cranial nerves: CN I: not tested CN II: pupils equal, round and reactive to light, visual fields intact CN III, IV, VI:  full range of motion, no nystagmus, no ptosis CN V: facial sensation intact. CN VII: upper and lower face symmetric CN VIII: hearing intact CN IX, X: uvula midline CN XI: sternocleidomastoid and trapezius muscles intact CN XII: tongue midline  Bulk & Tone: normal, no fasciculations. Motor:  muscle strength 5/5 throughout Deep Tendon Reflexes:  2+ throughout.   Sensation:  Light touch sensation intact. Finger to nose testing:  Without dysmetria.   Gait:  Normal station and stride.  Romberg negative.   Labs and Imaging review: New results: 12/13/23: HbA1c: 5.9 Lipid panel: tChol 177, LDL 96, TG 68.0  External labs (12/09/23): Vit D wnl CMP unremarkable CBC unremarkable  08/19/23: B12: 688 B1 wnl  MRI brain w/wo contrast (09/11/23): FINDINGS: Brain:   No age-advanced or lobar predominant cerebral atrophy.   Mild multifocal T2 FLAIR hyperintense signal abnormality within the cerebral white matter, nonspecific but most often secondary to chronic small vessel ischemia.   Punctate chronic microhemorrhage within the right corona radiata.   No cortical encephalomalacia is identified.   There is no acute infarct.   No evidence of an intracranial mass.   No extra-axial fluid collection.   No midline  shift.   Vascular: Maintained flow voids within the  proximal large arterial vessels.   Skull and upper cervical spine: No focal worrisome marrow lesion.   Sinuses/Orbits: No mass or acute finding within the imaged orbits. 8 mm mucous retention cyst within the right sphenoid sinus.   IMPRESSION: 1.  No evidence of an acute intracranial abnormality. 2. Mild multifocal T2 FLAIR hyperintense signal abnormality within the cerebral white matter, nonspecific but most often secondary to chronic small vessel ischemia. Findings are similar to the prior brain MRI of 05/08/2021. 3. 8 mm right sphenoid sinus mucous retention cyst.  Previously reviewed results: External labs: 04/17/23: TSH wnl Testosterone  wnl Vit D: 23.1 Lipid panel: tChol 125, LDL 45, TG107 CMP unremarkable CBC unremarkable   Imaging/Procedures: MRI brain wo contrast (05/08/21 for vertigo and HA): FINDINGS: Brain: Cerebral volume appears normal for age. Mild multifocal T2 FLAIR hyperintense signal abnormality within the cerebral white matter, nonspecific but most often secondary to chronic small vessel ischemia.   There is no acute infarct.   No evidence of an intracranial mass.   No chronic intracranial blood products.   No extra-axial fluid collection.   No midline shift.   Vascular: Maintained flow voids within the proximal large arterial vessels.   Skull and upper cervical spine: No focal suspicious marrow lesion.   Sinuses/Orbits: Visualized orbits show no acute finding. Mild mucosal thickening within the bilateral ethmoid sinuses. Small mucous retention cyst within the right sphenoid sinus. Small mucous retention cyst within the left maxillary sinus.   IMPRESSION: No evidence of acute intracranial abnormality.   Mild multifocal T2 FLAIR hyperintense signal abnormality within the cerebral white matter, nonspecific but most often secondary to chronic small vessel ischemia. These signal changes have slightly progressed from the prior brain MRI of  02/23/2016.   Otherwise unremarkable non-contrast MRI appearance of the brain for age.   Paranasal sinus disease, as described.  Assessment/Plan:  This is Ryan JULIANNA Natha Mickey., a 68 y.o. male with head pressure and disequilibrium. His symptoms are still unclear as they went away for months until the last couple of weeks. The disequilibrium sensation went away when the headaches went away, so they are likely related. Symptoms could be related to neck pain. He has contributions from medication overuse as well. Given he is having almost daily headaches, a preventative medication is warranted. He was on low dose nortriptyline  (10 mg) previously that did not help, likely because the dose was too low. I did not have the opportunity to increase it because patient did not let me know he stopped it due to it not helping. He is willing to retry today and let me know how it works so we can adjust if needed.   Plan: -Restart nortriptyline : Take 1 capsule (10 mg) at bedtime for 1 week, then increase to 2 capsules (20 mg) at bedtime thereafter. Patient will let me know in about 1 month how this is working -Limit use of pain relievers to no more than 2 days out of week to prevent risk of rebound or medication-overuse headache. -Continue home PT exercises for the neck -Keep headache diary   Return to clinic in 6 months  Total time spent reviewing records, interview, history/exam, documentation, and coordination of care on day of encounter:  35 min  Venetia Potters, MD

## 2024-01-27 ENCOUNTER — Ambulatory Visit: Admitting: Neurology

## 2024-01-27 ENCOUNTER — Encounter: Payer: Self-pay | Admitting: Neurology

## 2024-01-27 VITALS — BP 131/86 | HR 70 | Ht 64.0 in | Wt 168.0 lb

## 2024-01-27 DIAGNOSIS — G4452 New daily persistent headache (NDPH): Secondary | ICD-10-CM | POA: Diagnosis not present

## 2024-01-27 DIAGNOSIS — R42 Dizziness and giddiness: Secondary | ICD-10-CM | POA: Diagnosis not present

## 2024-01-27 DIAGNOSIS — M542 Cervicalgia: Secondary | ICD-10-CM

## 2024-01-27 MED ORDER — NORTRIPTYLINE HCL 10 MG PO CAPS: 20.0000 mg | ORAL_CAPSULE | Freq: Every day | ORAL | 11 refills | Status: DC

## 2024-01-27 NOTE — Patient Instructions (Addendum)
-  Restart nortriptyline : Take 1 capsule (10 mg) at bedtime for 1 week, then increase to 2 capsules (20 mg) at bedtime thereafter. You will let me know in about 1 month how this is working. -Limit use of pain relievers to no more than 2 days out of week to prevent risk of rebound or medication-overuse headache. -Continue home PT exercises for the neck -Keep headache diary  I will see you again in about 6 months. Please let me know if you have any questions or concerns in the meantime.   The physicians and staff at [facility] are committed to providing excellent care. You may receive a survey requesting feedback about your experience at our office. We strive to receive very good responses to the survey questions. If you feel that your experience would prevent you from giving the office a very good  response, please contact our office to try to remedy the situation. We may be reached at [phone]. Thank you for taking the time out of your busy day to complete the survey.  Venetia Potters, MD [facility]

## 2024-03-08 ENCOUNTER — Other Ambulatory Visit: Payer: Self-pay | Admitting: Internal Medicine

## 2024-03-23 ENCOUNTER — Encounter: Payer: Self-pay | Admitting: Neurology

## 2024-03-24 ENCOUNTER — Other Ambulatory Visit: Payer: Self-pay | Admitting: Neurology

## 2024-03-24 DIAGNOSIS — G4452 New daily persistent headache (NDPH): Secondary | ICD-10-CM

## 2024-03-24 DIAGNOSIS — R42 Dizziness and giddiness: Secondary | ICD-10-CM

## 2024-03-24 DIAGNOSIS — M542 Cervicalgia: Secondary | ICD-10-CM

## 2024-03-24 MED ORDER — NORTRIPTYLINE HCL 25 MG PO CAPS: ORAL_CAPSULE | ORAL | 5 refills | Status: AC

## 2024-08-02 ENCOUNTER — Ambulatory Visit: Admitting: Neurology
